# Patient Record
Sex: Female | Born: 1940 | Hispanic: No | Smoking: Never smoker
Health system: Southern US, Community
[De-identification: ages and names within clinical notes are randomized; demographics above are authoritative.]

## PROBLEM LIST (undated history)

## (undated) DIAGNOSIS — F341 Dysthymic disorder: Secondary | ICD-10-CM

## (undated) DIAGNOSIS — S7291XA Unspecified fracture of right femur, initial encounter for closed fracture: Secondary | ICD-10-CM

## (undated) DIAGNOSIS — R002 Palpitations: Secondary | ICD-10-CM

## (undated) DIAGNOSIS — R591 Generalized enlarged lymph nodes: Secondary | ICD-10-CM

## (undated) DIAGNOSIS — B0229 Other postherpetic nervous system involvement: Principal | ICD-10-CM

## (undated) DIAGNOSIS — N390 Urinary tract infection, site not specified: Secondary | ICD-10-CM

## (undated) DIAGNOSIS — K5289 Other specified noninfective gastroenteritis and colitis: Secondary | ICD-10-CM

## (undated) DIAGNOSIS — A048 Other specified bacterial intestinal infections: Secondary | ICD-10-CM

## (undated) DIAGNOSIS — D649 Anemia, unspecified: Secondary | ICD-10-CM

## (undated) DIAGNOSIS — E785 Hyperlipidemia, unspecified: Secondary | ICD-10-CM

## (undated) DIAGNOSIS — E559 Vitamin D deficiency, unspecified: Secondary | ICD-10-CM

## (undated) DIAGNOSIS — Z01818 Encounter for other preprocedural examination: Secondary | ICD-10-CM

## (undated) DIAGNOSIS — R197 Diarrhea, unspecified: Secondary | ICD-10-CM

## (undated) DIAGNOSIS — J449 Chronic obstructive pulmonary disease, unspecified: Secondary | ICD-10-CM

## (undated) DIAGNOSIS — K589 Irritable bowel syndrome without diarrhea: Secondary | ICD-10-CM

## (undated) DIAGNOSIS — E876 Hypokalemia: Secondary | ICD-10-CM

## (undated) DIAGNOSIS — B029 Zoster without complications: Secondary | ICD-10-CM

## (undated) DIAGNOSIS — E0590 Thyrotoxicosis, unspecified without thyrotoxic crisis or storm: Secondary | ICD-10-CM

## (undated) DIAGNOSIS — M1990 Unspecified osteoarthritis, unspecified site: Secondary | ICD-10-CM

## (undated) DIAGNOSIS — Z8619 Personal history of other infectious and parasitic diseases: Secondary | ICD-10-CM

## (undated) DIAGNOSIS — M1610 Unilateral primary osteoarthritis, unspecified hip: Secondary | ICD-10-CM

## (undated) DIAGNOSIS — R634 Abnormal weight loss: Secondary | ICD-10-CM

## (undated) HISTORY — DX: Thyrotoxicosis, unspecified without thyrotoxic crisis or storm: E05.90

## (undated) HISTORY — PX: OTHER SURGICAL HISTORY: SHX169

## (undated) HISTORY — DX: Zoster without complications: B02.9

## (undated) HISTORY — DX: Other specified noninfective gastroenteritis and colitis: K52.89

## (undated) HISTORY — DX: Palpitations: R00.2

## (undated) HISTORY — DX: Other postherpetic nervous system involvement: B02.29

## (undated) HISTORY — DX: Generalized enlarged lymph nodes: R59.1

## (undated) HISTORY — PX: ABDOMINAL HYSTERECTOMY: SHX81

## (undated) HISTORY — DX: Hypokalemia: E87.6

## (undated) HISTORY — PX: APPENDECTOMY: SHX54

## (undated) HISTORY — DX: Unspecified fracture of right femur, initial encounter for closed fracture: S72.91XA

## (undated) HISTORY — DX: Anemia, unspecified: D64.9

## (undated) HISTORY — DX: Other specified bacterial intestinal infections: A04.8

## (undated) HISTORY — DX: Irritable bowel syndrome without diarrhea: K58.9

## (undated) HISTORY — DX: Vitamin D deficiency, unspecified: E55.9

## (undated) HISTORY — DX: Hyperlipidemia, unspecified: E78.5

## (undated) HISTORY — DX: Encounter for other preprocedural examination: Z01.818

## (undated) HISTORY — DX: Abnormal weight loss: R63.4

## (undated) HISTORY — DX: Urinary tract infection, site not specified: N39.0

## (undated) HISTORY — DX: Diarrhea, unspecified: R19.7

## (undated) HISTORY — DX: Personal history of other infectious and parasitic diseases: Z86.19

## (undated) HISTORY — DX: Unilateral primary osteoarthritis, unspecified hip: M16.10

## (undated) HISTORY — PX: BREAST BIOPSY: SHX20

## (undated) HISTORY — DX: Dysthymic disorder: F34.1

---

## 2004-06-13 ENCOUNTER — Ambulatory Visit: Payer: Self-pay | Admitting: Family Medicine

## 2004-09-05 ENCOUNTER — Ambulatory Visit: Payer: Self-pay | Admitting: Family Medicine

## 2005-06-02 ENCOUNTER — Ambulatory Visit: Payer: Self-pay | Admitting: Family Medicine

## 2006-12-08 ENCOUNTER — Emergency Department (HOSPITAL_COMMUNITY): Admission: EM | Admit: 2006-12-08 | Discharge: 2006-12-08 | Payer: Self-pay | Admitting: Emergency Medicine

## 2006-12-10 ENCOUNTER — Ambulatory Visit: Payer: Self-pay | Admitting: Family Medicine

## 2006-12-24 ENCOUNTER — Ambulatory Visit: Payer: Self-pay | Admitting: Family Medicine

## 2007-01-01 ENCOUNTER — Encounter: Payer: Self-pay | Admitting: Family Medicine

## 2007-02-04 ENCOUNTER — Encounter: Payer: Self-pay | Admitting: Family Medicine

## 2007-02-07 ENCOUNTER — Encounter: Admission: RE | Admit: 2007-02-07 | Discharge: 2007-02-07 | Payer: Self-pay | Admitting: Gastroenterology

## 2007-02-24 ENCOUNTER — Encounter: Payer: Self-pay | Admitting: Family Medicine

## 2007-02-25 ENCOUNTER — Telehealth: Payer: Self-pay | Admitting: Family Medicine

## 2007-03-11 ENCOUNTER — Ambulatory Visit: Payer: Self-pay | Admitting: Family Medicine

## 2007-03-11 DIAGNOSIS — N390 Urinary tract infection, site not specified: Secondary | ICD-10-CM | POA: Insufficient documentation

## 2007-03-11 DIAGNOSIS — R634 Abnormal weight loss: Secondary | ICD-10-CM

## 2007-03-11 DIAGNOSIS — E0590 Thyrotoxicosis, unspecified without thyrotoxic crisis or storm: Secondary | ICD-10-CM | POA: Insufficient documentation

## 2007-03-11 DIAGNOSIS — E785 Hyperlipidemia, unspecified: Secondary | ICD-10-CM

## 2007-03-11 HISTORY — DX: Abnormal weight loss: R63.4

## 2007-03-11 HISTORY — DX: Thyrotoxicosis, unspecified without thyrotoxic crisis or storm: E05.90

## 2007-03-11 HISTORY — DX: Urinary tract infection, site not specified: N39.0

## 2007-03-11 HISTORY — DX: Hyperlipidemia, unspecified: E78.5

## 2007-03-12 ENCOUNTER — Encounter: Payer: Self-pay | Admitting: Family Medicine

## 2007-03-13 ENCOUNTER — Telehealth: Payer: Self-pay | Admitting: Family Medicine

## 2007-03-18 ENCOUNTER — Telehealth: Payer: Self-pay | Admitting: Family Medicine

## 2007-03-18 ENCOUNTER — Ambulatory Visit: Payer: Self-pay | Admitting: Family Medicine

## 2007-03-18 LAB — CONVERTED CEMR LAB
Bilirubin Urine: NEGATIVE
Glucose, Urine, Semiquant: NEGATIVE
Ketones, urine, test strip: NEGATIVE
Nitrite: NEGATIVE
Protein, U semiquant: NEGATIVE
Specific Gravity, Urine: 1.015
Urobilinogen, UA: 0.2
pH: 7.5

## 2007-03-19 ENCOUNTER — Telehealth: Payer: Self-pay | Admitting: Family Medicine

## 2007-03-27 ENCOUNTER — Encounter: Payer: Self-pay | Admitting: Family Medicine

## 2007-03-28 ENCOUNTER — Encounter: Payer: Self-pay | Admitting: Family Medicine

## 2007-04-02 ENCOUNTER — Encounter: Payer: Self-pay | Admitting: Family Medicine

## 2007-04-03 ENCOUNTER — Encounter: Payer: Self-pay | Admitting: Family Medicine

## 2007-04-03 ENCOUNTER — Encounter: Admission: RE | Admit: 2007-04-03 | Discharge: 2007-04-03 | Payer: Self-pay | Admitting: Gastroenterology

## 2007-04-30 ENCOUNTER — Encounter: Payer: Self-pay | Admitting: Family Medicine

## 2007-05-22 ENCOUNTER — Ambulatory Visit: Payer: Self-pay | Admitting: Family Medicine

## 2007-05-22 DIAGNOSIS — K5289 Other specified noninfective gastroenteritis and colitis: Secondary | ICD-10-CM | POA: Insufficient documentation

## 2007-05-22 HISTORY — DX: Other specified noninfective gastroenteritis and colitis: K52.89

## 2007-05-23 LAB — CONVERTED CEMR LAB
Basophils Absolute: 0.1 10*3/uL (ref 0.0–0.1)
Basophils Relative: 1.4 % — ABNORMAL HIGH (ref 0.0–1.0)
Eosinophils Absolute: 0.1 10*3/uL (ref 0.0–0.6)
Eosinophils Relative: 1.5 % (ref 0.0–5.0)
Free T4: 0.9 ng/dL (ref 0.6–1.6)
HCT: 38.5 % (ref 36.0–46.0)
Hemoglobin: 13.5 g/dL (ref 12.0–15.0)
Lymphocytes Relative: 22.3 % (ref 12.0–46.0)
MCHC: 35.1 g/dL (ref 30.0–36.0)
MCV: 97 fL (ref 78.0–100.0)
Monocytes Absolute: 0.3 10*3/uL (ref 0.2–0.7)
Monocytes Relative: 5.3 % (ref 3.0–11.0)
Neutro Abs: 3.5 10*3/uL (ref 1.4–7.7)
Neutrophils Relative %: 69.5 % (ref 43.0–77.0)
Platelets: 219 10*3/uL (ref 150–400)
RBC: 3.97 M/uL (ref 3.87–5.11)
RDW: 11.5 % (ref 11.5–14.6)
T3, Free: 3 pg/mL (ref 2.3–4.2)
TSH: 0.98 microintl units/mL (ref 0.35–5.50)
WBC: 5.1 10*3/uL (ref 4.5–10.5)

## 2007-08-05 ENCOUNTER — Encounter: Payer: Self-pay | Admitting: Family Medicine

## 2008-04-20 ENCOUNTER — Ambulatory Visit: Payer: Self-pay | Admitting: Family Medicine

## 2008-05-10 ENCOUNTER — Telehealth: Payer: Self-pay | Admitting: Family Medicine

## 2008-12-01 ENCOUNTER — Telehealth: Payer: Self-pay | Admitting: Family Medicine

## 2009-02-23 ENCOUNTER — Telehealth: Payer: Self-pay | Admitting: Family Medicine

## 2009-05-19 ENCOUNTER — Ambulatory Visit: Payer: Self-pay | Admitting: Family Medicine

## 2009-05-19 DIAGNOSIS — M1610 Unilateral primary osteoarthritis, unspecified hip: Secondary | ICD-10-CM

## 2009-05-19 DIAGNOSIS — E559 Vitamin D deficiency, unspecified: Secondary | ICD-10-CM

## 2009-05-19 DIAGNOSIS — E876 Hypokalemia: Secondary | ICD-10-CM | POA: Insufficient documentation

## 2009-05-19 DIAGNOSIS — D649 Anemia, unspecified: Secondary | ICD-10-CM | POA: Insufficient documentation

## 2009-05-19 DIAGNOSIS — R350 Frequency of micturition: Secondary | ICD-10-CM | POA: Insufficient documentation

## 2009-05-19 DIAGNOSIS — K589 Irritable bowel syndrome without diarrhea: Secondary | ICD-10-CM

## 2009-05-19 DIAGNOSIS — R197 Diarrhea, unspecified: Secondary | ICD-10-CM

## 2009-05-19 DIAGNOSIS — F411 Generalized anxiety disorder: Secondary | ICD-10-CM | POA: Insufficient documentation

## 2009-05-19 DIAGNOSIS — F341 Dysthymic disorder: Secondary | ICD-10-CM

## 2009-05-19 DIAGNOSIS — R252 Cramp and spasm: Secondary | ICD-10-CM | POA: Insufficient documentation

## 2009-05-19 DIAGNOSIS — T50995A Adverse effect of other drugs, medicaments and biological substances, initial encounter: Secondary | ICD-10-CM | POA: Insufficient documentation

## 2009-05-19 HISTORY — DX: Dysthymic disorder: F34.1

## 2009-05-19 HISTORY — DX: Hypokalemia: E87.6

## 2009-05-19 HISTORY — DX: Diarrhea, unspecified: R19.7

## 2009-05-19 HISTORY — DX: Unilateral primary osteoarthritis, unspecified hip: M16.10

## 2009-05-19 HISTORY — DX: Vitamin D deficiency, unspecified: E55.9

## 2009-05-19 HISTORY — DX: Anemia, unspecified: D64.9

## 2009-05-19 HISTORY — DX: Irritable bowel syndrome, unspecified: K58.9

## 2009-05-26 ENCOUNTER — Telehealth: Payer: Self-pay | Admitting: Family Medicine

## 2009-06-01 ENCOUNTER — Encounter: Payer: Self-pay | Admitting: Family Medicine

## 2009-06-01 ENCOUNTER — Encounter (INDEPENDENT_AMBULATORY_CARE_PROVIDER_SITE_OTHER): Payer: Self-pay | Admitting: *Deleted

## 2009-06-09 ENCOUNTER — Encounter: Payer: Self-pay | Admitting: Family Medicine

## 2009-06-14 ENCOUNTER — Telehealth: Payer: Self-pay | Admitting: Family Medicine

## 2009-09-05 ENCOUNTER — Encounter: Payer: Self-pay | Admitting: Family Medicine

## 2009-09-13 ENCOUNTER — Encounter: Payer: Self-pay | Admitting: Family Medicine

## 2009-12-06 ENCOUNTER — Encounter: Payer: Self-pay | Admitting: Family Medicine

## 2009-12-14 ENCOUNTER — Encounter: Payer: Self-pay | Admitting: Family Medicine

## 2010-05-04 ENCOUNTER — Ambulatory Visit: Payer: Self-pay | Admitting: Family Medicine

## 2010-06-07 ENCOUNTER — Encounter: Payer: Self-pay | Admitting: Family Medicine

## 2010-06-13 ENCOUNTER — Encounter: Payer: Self-pay | Admitting: Family Medicine

## 2010-06-15 ENCOUNTER — Encounter: Payer: Self-pay | Admitting: Family Medicine

## 2010-06-27 ENCOUNTER — Encounter: Payer: Self-pay | Admitting: Family Medicine

## 2010-08-11 ENCOUNTER — Telehealth: Payer: Self-pay | Admitting: Family Medicine

## 2010-08-27 LAB — CONVERTED CEMR LAB
ALT: 15 units/L (ref 0–35)
ALT: 16 units/L (ref 0–35)
AST: 19 units/L (ref 0–37)
AST: 22 units/L (ref 0–37)
Albumin: 4.2 g/dL (ref 3.5–5.2)
Albumin: 4.7 g/dL (ref 3.5–5.2)
Alkaline Phosphatase: 50 units/L (ref 39–117)
Alkaline Phosphatase: 58 units/L (ref 39–117)
BUN: 10 mg/dL (ref 6–23)
BUN: 8 mg/dL (ref 6–23)
Basophils Absolute: 0 10*3/uL (ref 0.0–0.1)
Basophils Absolute: 0 10*3/uL (ref 0.0–0.1)
Basophils Relative: 0.2 % (ref 0.0–1.0)
Basophils Relative: 0.6 % (ref 0.0–3.0)
Bilirubin Urine: NEGATIVE
Bilirubin Urine: NEGATIVE
Bilirubin, Direct: 0.1 mg/dL (ref 0.0–0.3)
Bilirubin, Direct: 0.2 mg/dL (ref 0.0–0.3)
CO2: 28 meq/L (ref 19–32)
CO2: 31 meq/L (ref 19–32)
Calcium: 9.1 mg/dL (ref 8.4–10.5)
Calcium: 9.6 mg/dL (ref 8.4–10.5)
Chloride: 105 meq/L (ref 96–112)
Chloride: 106 meq/L (ref 96–112)
Cholesterol: 189 mg/dL (ref 0–200)
Cholesterol: 233 mg/dL — ABNORMAL HIGH (ref 0–200)
Creatinine, Ser: 0.7 mg/dL (ref 0.4–1.2)
Creatinine, Ser: 0.7 mg/dL (ref 0.4–1.2)
Direct LDL: 155.2 mg/dL
Eosinophils Absolute: 0 10*3/uL (ref 0.0–0.6)
Eosinophils Absolute: 0 10*3/uL (ref 0.0–0.7)
Eosinophils Relative: 0.5 % (ref 0.0–5.0)
Eosinophils Relative: 0.6 % (ref 0.0–5.0)
Free T4: 0.9 ng/dL (ref 0.6–1.6)
GFR calc Af Amer: 108 mL/min
GFR calc non Af Amer: 88.42 mL/min (ref >60)
GFR calc non Af Amer: 89 mL/min
Glucose, Bld: 82 mg/dL (ref 70–99)
Glucose, Bld: 92 mg/dL (ref 70–99)
Glucose, Urine, Semiquant: NEGATIVE
Glucose, Urine, Semiquant: NEGATIVE
HCT: 38.7 % (ref 36.0–46.0)
HCT: 40.4 % (ref 36.0–46.0)
HDL: 52.3 mg/dL (ref >39.0)
HDL: 62.5 mg/dL (ref >39.00)
Hemoglobin: 13.6 g/dL (ref 12.0–15.0)
Hemoglobin: 13.9 g/dL (ref 12.0–15.0)
Ketones, urine, test strip: NEGATIVE
Ketones, urine, test strip: NEGATIVE
LDL Cholesterol: 120 mg/dL — ABNORMAL HIGH (ref 0–99)
Lymphocytes Relative: 22.1 % (ref 12.0–46.0)
Lymphocytes Relative: 24.8 % (ref 12.0–46.0)
Lymphs Abs: 1.3 10*3/uL (ref 0.7–4.0)
MCHC: 34.4 g/dL (ref 30.0–36.0)
MCHC: 35 g/dL (ref 30.0–36.0)
MCV: 100.9 fL — ABNORMAL HIGH (ref 78.0–100.0)
MCV: 96.9 fL (ref 78.0–100.0)
Monocytes Absolute: 0.3 10*3/uL (ref 0.2–0.7)
Monocytes Absolute: 0.4 10*3/uL (ref 0.1–1.0)
Monocytes Relative: 4.9 % (ref 3.0–11.0)
Monocytes Relative: 6.8 % (ref 3.0–12.0)
Neutro Abs: 3.5 10*3/uL (ref 1.4–7.7)
Neutro Abs: 4.8 10*3/uL (ref 1.4–7.7)
Neutrophils Relative %: 67.3 % (ref 43.0–77.0)
Neutrophils Relative %: 72.2 % (ref 43.0–77.0)
Nitrite: NEGATIVE
Nitrite: NEGATIVE
Platelets: 200 10*3/uL (ref 150.0–400.0)
Platelets: 224 10*3/uL (ref 150–400)
Potassium: 3.5 meq/L (ref 3.5–5.1)
Potassium: 4.4 meq/L (ref 3.5–5.1)
Protein, U semiquant: NEGATIVE
Protein, U semiquant: NEGATIVE
RBC: 4 M/uL (ref 3.87–5.11)
RBC: 4.01 M/uL (ref 3.87–5.11)
RDW: 11.7 % (ref 11.5–14.6)
RDW: 11.7 % (ref 11.5–14.6)
Sed Rate: 10 mm/hr (ref 0–25)
Sodium: 142 meq/L (ref 135–145)
Sodium: 144 meq/L (ref 135–145)
Specific Gravity, Urine: 1.01
Specific Gravity, Urine: 1.01
T3, Free: 2.7 pg/mL (ref 2.3–4.2)
TSH: 0.87 microintl units/mL (ref 0.35–5.50)
TSH: 1.07 microintl units/mL (ref 0.35–5.50)
Total Bilirubin: 0.9 mg/dL (ref 0.3–1.2)
Total Bilirubin: 1 mg/dL (ref 0.3–1.2)
Total CHOL/HDL Ratio: 3.6
Total CHOL/HDL Ratio: 4
Total Protein: 6.3 g/dL (ref 6.0–8.3)
Total Protein: 6.9 g/dL (ref 6.0–8.3)
Triglycerides: 60 mg/dL (ref 0.0–149.0)
Triglycerides: 84 mg/dL (ref 0–149)
Urobilinogen, UA: 0.2
Urobilinogen, UA: 0.2
VLDL: 12 mg/dL (ref 0.0–40.0)
VLDL: 17 mg/dL (ref 0–40)
Vit D, 25-Hydroxy: 20 ng/mL — ABNORMAL LOW (ref 30–89)
WBC Urine, dipstick: NEGATIVE
WBC: 5.2 10*3/uL (ref 4.5–10.5)
WBC: 6.6 10*3/uL (ref 4.5–10.5)
pH: 5.5
pH: 6

## 2010-08-29 NOTE — Progress Notes (Signed)
Summary: CALL REG MEDICATION.  Phone Note Call from Patient   Caller: Patient Call For: Judithann Sheen MD Summary of Call: Pt calls stating she cannot take the med Dr. Scotty Court gave her last week, and would like her recent lab results. [phone] Initial call taken by: Lynann Beaver CMA,  May 26, 2009 3:26 PM  Follow-up for Phone Call        cannot take hyscoamine  but pharmascist recommended drug for $4.00 --bentyl   Follow-up by: Pura Spice, RN,  May 26, 2009 3:42 PM  Additional Follow-up for Phone Call Additional follow up Details #1::        PER DR  STAFFORD TAKE 1/2 OF HYSCOAMINE PT NOTIFIED AND WILL CALL NEXT WEEK WITH UPDATE ON HER DIARRHEA.  Additional Follow-up by: Pura Spice, RN,  May 26, 2009 4:25 PM

## 2010-08-29 NOTE — Progress Notes (Signed)
Summary: PLEASE CALL  Phone Note Call from Patient Call back at Home Phone [phone]   Caller: Patient Call For: Judithann Sheen MD Summary of Call: PT WOULD LIKE DR STAFFORD TO CALL HER PERSONALLY Initial call taken by: Heron Sabins,  June 14, 2009 10:48 AM  Follow-up for Phone Call        Pt called back and really needs Dr. Scotty Court to call her back asap. will call  Follow-up by: Lucy Antigua,  June 16, 2009 10:41 AM

## 2010-08-29 NOTE — Assessment & Plan Note (Signed)
Summary: FUP/WILL FAST-272.4,1995.2,995.2,2449,599.0   Vital Signs:  Patient Profile:   70 Years Old Female Weight:      112 pounds Temp:     98.3 degrees F oral Pulse rate:   80 / minute BP sitting:   110 / 84  (left arm)  Vitals Entered By: Pura Spice, RN (March 11, 2007 10:21 AM)               Chief Complaint:  ck up .  History of Present Illness: reason for visit and for cpx . IBS with sx diarrhea poorly controlled taking immodium irradiacally has had complete GI wk up by [facility] with negative findings medications -librax levbid and now immodium pt has lost from 122 in May to now 110  unexplained to rule out hyperthyroidism or other metabolic problems          Review of Systems  General      Complains of weight loss.  Eyes      Denies blurring, discharge, double vision, eye irritation, eye pain, halos, itching, light sensitivity, red eye, vision loss-1 eye, and vision loss-both eyes.  ENT      Denies decreased hearing, difficulty swallowing, ear discharge, earache, hoarseness, nasal congestion, nosebleeds, postnasal drainage, ringing in ears, sinus pressure, and sore throat.  CV      Denies bluish discoloration of lips or nails, chest pain or discomfort, difficulty breathing at night, difficulty breathing while lying down, fainting, fatigue, leg cramps with exertion, lightheadness, near fainting, palpitations, shortness of breath with exertion, swelling of feet, swelling of hands, and weight gain.  Resp      Denies chest discomfort, chest pain with inspiration, cough, coughing up blood, excessive snoring, hypersomnolence, morning headaches, pleuritic, shortness of breath, sputum productive, and wheezing.  GI      Complains of abdominal pain and diarrhea.  GU      Denies abnormal vaginal bleeding, decreased libido, discharge, dysuria, genital sores, hematuria, incontinence, nocturia, urinary frequency, and urinary hesitancy.  MS      Denies joint  pain, joint redness, joint swelling, loss of strength, low back pain, mid back pain, muscle aches, muscle , cramps, muscle weakness, stiffness, and thoracic pain.  Derm      Denies changes in color of skin, changes in nail beds, dryness, excessive perspiration, flushing, hair loss, insect bite(s), itching, lesion(s), poor wound healing, and rash.  Neuro      Denies brief paralysis, difficulty with concentration, disturbances in coordination, falling down, headaches, inability to speak, memory loss, numbness, poor balance, seizures, sensation of room spinning, tingling, tremors, visual disturbances, and weakness.  Psych      Complains of anxiety.  Endo      Complains of weight change.  Allergy      Denies hives or rash, itching eyes, persistent infections, seasonal allergies, and sneezing.  ENT      Denies decreased hearing, difficulty swallowing, ear discharge, earache, hoarseness, nasal congestion, nosebleeds, postnasal drainage, ringing in ears, sinus pressure, and sore throat.   Physical Exam  General:     Well-developed,well-nourished,in no acute distress; alert,appropriate and cooperative throughout examination Head:     Normocephalic and atraumatic without obvious abnormalities. No apparent alopecia or balding. Eyes:     No corneal or conjunctival inflammation noted. EOMI. Perrla. Funduscopic exam benign, without hemorrhages, exudates or papilledema. Vision grossly normal. Ears:     External ear exam shows no significant lesions or deformities.  Otoscopic examination reveals clear canals, tympanic membranes are intact  bilaterally without bulging, retraction, inflammation or discharge. Hearing is grossly normal bilaterally. Nose:     External nasal examination shows no deformity or inflammation. Nasal mucosa are pink and moist without lesions or exudates. Neck:     No deformities, masses, or tenderness noted. Chest Wall:     No deformities, masses, or tenderness  noted. Breasts:     No mass, nodules, thickening, tenderness, bulging, retraction, inflamation, nipple discharge or skin changes noted.   Lungs:     Normal respiratory effort, chest expands symmetrically. Lungs are clear to auscultation, no crackles or wheezes. Heart:     Normal rate and regular rhythm. S1 and S2 normal without gallop, murmur, click, rub or other extra sounds. Abdomen:     Bowel sound hyperactive,abdomen soft and non-tender without masses, organomegaly or hernias Rectal:     No external abnormalities noted. Normal sphincter tone. No rectal masses or tenderness. Genitalia:     Normal introitus for age, no external lesions, no vaginal discharge, mucosa pink and moist, no vaginal or cervical lesions, no vaginal atrophy, no friaility or hemorrhage, normal uterus size and position, no adnexal masses or tenderness Msk:     No deformity or scoliosis noted of thoracic or lumbar spine.   Pulses:     R and L carotid,radial,femoral,dorsalis pedis and posterior tibial pulses are full and equal bilaterally Extremities:     No clubbing, cyanosis, edema, or deformity noted with normal full range of motion of all joints.   Neurologic:     No cranial nerve deficits noted. Station and gait are normal. Plantar reflexes are down-going bilaterally. DTRs are symmetrical throughout. Sensory, motor and coordinative functions appear intact. Skin:     Intact without suspicious lesions or rashes Cervical Nodes:     No lymphadenopathy noted Axillary Nodes:     No palpable lymphadenopathy Inguinal Nodes:     No significant adenopathy Psych:     Cognition and judgment appear intact. Alert and cooperative with normal attention span and concentration. No apparent delusions, illusions, hallucinations some anxiety    Impression & Recommendations:  Problem # 1:  WEIGHT LOSS (JYN-829.56) Assessment: New  Orders: Venipuncture (21308) TLB-CBC Platelet - w/Differential (85025-CBCD) TLB-Lipid  Panel (80061-LIPID) TLB-BMP (Basic Metabolic Panel-BMET) (80048-METABOL)   Problem # 2:  IBS (ICD-564.1) Assessment: Unchanged immodium three times a day before meals  Complete Medication List: 1)  Alprazolam 0.25 Mg Tabs (Alprazolam) .... Three times a day as needed stress  Other Orders: EKG w/ Interpretation (93000) EKG w/ Interpretation (93000) TLB-Hepatic/Liver Function Pnl (80076-HEPATIC) TLB-T3, Free (Triiodothyronine) (84481-T3FREE) TLB-T4 (Thyrox), Free [phone]) TLB-TSH (Thyroid Stimulating Hormone) [phone])         Laboratory Results   Urine Tests   Date/Time Reported: March 11, 2007 12:44 PM   Routine Urinalysis   Color: yellow Appearance: Clear Glucose: negative   (Normal Range: Negative) Bilirubin: negative   (Normal Range: Negative) Ketone: negative   (Normal Range: Negative) Spec. Gravity: 1.010   (Normal Range: 1.003-1.035) Blood: 1+   (Normal Range: Negative) pH: 5.5   (Normal Range: 5.0-8.0) Protein: negative   (Normal Range: Negative) Urobilinogen: 0.2   (Normal Range: 0-1) Nitrite: negative   (Normal Range: Negative) Leukocyte Esterace: negative   (Normal Range: Negative)    Comments: ..................................................................Marland KitchenWynona Canes, CMA  March 11, 2007 12:44 PM    Blood Tests    Date/Time Reported: March 11, 2007 1:19 PM   SED rate: 4  Comments: ..................................................................Marland KitchenWynona Canes, CMA  March 11, 2007 1:19 PM

## 2010-08-29 NOTE — Progress Notes (Signed)
Summary: rf request lonox  Phone Note Call from Patient Call back at [phone]   Caller: vm Call For: stafford Reason for Call: Talk to Doctor Summary of Call: Call [facility] for Rx run out.  They faxed request Tues.  diphen-atrop 2.5mg  for diarrhea 2tabs three times a day #60. Initial call taken by: Rudy Jew, RN,  May 10, 2008 11:21 AM  Follow-up for Phone Call        called  Follow-up by: Pura Spice, RN,  May 11, 2008 8:24 AM      Prescriptions: LONOX 2.5-0.025 MG  TABS (DIPHENOXYLATE-ATROPINE) take 1 or 2  tablets three times a day  30 minutes before meals to  prevent diarrhea  #60 x 5   Entered by:   Pura Spice, RN   Authorized by:   Judithann Sheen MD   Signed by:   Pura Spice, RN on 05/11/2008   Method used:   Telephoned to ...       [facility] (retail)       [address]       [place], [place]  [address]       Ph: [phone]       Fax: [phone]   RxID:   [id number]

## 2010-08-29 NOTE — Progress Notes (Signed)
Summary: mess reqest. Dr to call with lab results.  Phone Note Call from Patient Call back at Home Phone [phone]   Caller: Patient Call For: Dr. Scotty Court Reason for Call: Lab or Test Results Summary of Call: Wants results of labs drawn on Tuesday. Initial call taken by: Barnie Mort,  March 13, 2007 10:26 AM  Follow-up for Phone Call        called by Dr Scotty Court this am  Follow-up by: Pura Spice, RN,  March 13, 2007 12:41 PM

## 2010-08-29 NOTE — Progress Notes (Signed)
Summary: needs labs and pt called  Phone Note Call from Patient Call back at Home Phone [phone]   Caller: Patient Call For: stafford Summary of Call: returning phone call to Dr Scotty Court from his call last week, concerning some results from upper GI. Please call Initial call taken by: Calvert Cantor,  February 25, 2007 2:14 PM  Follow-up for Phone Call        make appt for lavbs need lpids cbcd,basic metabolic, hepatic, tsh and urine please call and ask her to be fasting Follow-up by: Judithann Sheen MD,  February 26, 2007 3:50 PM  Additional Follow-up for Phone Call Additional follow up Details #1::        need diagnosis codes and then please send order to schedulers. Additional Follow-up by: Sid Falcon LPN,  February 26, 2007 4:37 PM    Additional Follow-up for Phone Call Additional follow up Details #2::    CBCD 2856 Lipids272.4 Basic Metaboliic Pane l995.2 Hepatic995.2 TSH 2449 Urinalysis 599.0  Additional Follow-up for Phone Call Additional follow up Details #3:: Details for Additional Follow-up Action Taken: PT NOTIFIED. WILL COME IN ON 03/11/07 AT 9:30 PER DR STAFFORD Additional Follow-up by: Warnell Forester,  February 27, 2007 10:38 AM

## 2010-08-29 NOTE — Letter (Signed)
Summary: [facility]  [facility]   Imported By: Kassie Mends 06/05/2007 10:30:55  _____________________________________________________________________  External Attachment:    Type:   Image     Comment:   [facility]

## 2010-08-29 NOTE — Assessment & Plan Note (Signed)
Summary: FLU-SHOT/RCD  Nurse Visit     Impression & Recommendations:       Flu Vaccine Consent Questions     Do you have a history of severe allergic reactions to this vaccine? no    Any prior history of allergic reactions to egg and/or gelatin? no    Do you have a sensitivity to the preservative Thimersol? no    Do you have a past history of Guillan-Barre Syndrome? no    Do you currently have an acute febrile illness? no    Have you ever had a severe reaction to latex? no    Vaccine information given and explained to patient? yes    Are you currently pregnant? no    Lot Number:[id number]   Site Given  Left Deltoid IM   Complete Medication List: 1)  Alprazolam 0.25 Mg Tabs (Alprazolam) .... Three times a day as needed stress 2)  Nitrofurantoin Macrocrystal 50 Mg Caps (Nitrofurantoin macrocrystal) .... Per dr Isabel Caprice 3)  Flomax 0.4 Mg Cp24 (Tamsulosin hcl) .... Per dr Isabel Caprice 4)  Clidinium-chlordiazepoxide 2.5-5 Mg Caps (Clidinium-chlordiazepoxide) 5)  Lonox 2.5-0.025 Mg Tabs (Diphenoxylate-atropine) .... Take 1 or 2  tablets three times a day  30 minutes before meals to  prevent diarrhea  Prior Medications: ALPRAZOLAM 0.25 MG  TABS (ALPRAZOLAM) three times a day as needed stress NITROFURANTOIN MACROCRYSTAL 50 MG  CAPS (NITROFURANTOIN MACROCRYSTAL) per dr Isabel Caprice FLOMAX 0.4 MG  CP24 (TAMSULOSIN HCL) per dr Isabel Caprice CLIDINIUM-CHLORDIAZEPOXIDE 2.5-5 MG  CAPS (CLIDINIUM-CHLORDIAZEPOXIDE)  LONOX 2.5-0.025 MG  TABS (DIPHENOXYLATE-ATROPINE) take 1 or 2  tablets three times a day  30 minutes before meals to  prevent diarrhea     Orders Added: 1)  Flu Vaccine 30yrs + [91478] 2)  Admin of Therapeutic Inj (IM or [place]) Lepidus.[place]nam    ]

## 2010-08-29 NOTE — Letter (Signed)
Summary: Lipid Letter  [place] [facility]  [address] [place], [place] [address]   Phone: [phone]  Fax: [phone]    03/12/2007  [name] [address] [place], [place]  [address]  Dear [name]:  We have carefully reviewed your last lipid profile from  and the results are noted below with a summary of recommendations for lipid management.    Cholesterol:           Goal: <   HDL "good" Cholesterol:       Goal: >   LDL "bad" Cholesterol:         Goal: <   Triglycerides:           Goal: <        TLC Diet (Therapeutic Lifestyle Change): Saturated Fats & Transfatty acids should be kept < 7% of total calories ***Reduce Saturated Fats Polyunstaurated Fat can be up to 10% of total calories Monounsaturated Fat Fat can be up to 20% of total calories Total Fat should be no greater than 25-35% of total calories Carbohydrates should be 50-60% of total calories Protein should be approximately 15% of total calories Fiber should be at least 20-30 grams a day ***Increased fiber may help lower LDL Total Cholesterol should be < 200mg /day Consider adding plant stanol/sterols to diet (example: Benacol spread) ***A higher intake of unsaturated fat may reduce Triglycerides and Increase HDL    Adjunctive Measures (may lower LIPIDS and reduce risk of Heart Attack) include: Aerobic Exercise (20-30 minutes 3-4 times a week) Limit Alcohol Consumption Weight Reduction Aspirin 75-81 mg a day by mouth (if not allergic or contraindicated) Vitamin E 400 IU a day by mouth Folic Acid 1mg  a day by mouth Dietary Fiber 20-30 grams a day by mouth     Current Medications: 1)    Alprazolam 0.25 Mg  Tabs (Alprazolam) .... Three times a day as needed stress  If you have any questions, please call. We appreciate being able to work with you.   Sincerely,     at [employer]

## 2010-08-29 NOTE — Progress Notes (Signed)
Summary: Wants to start Lexapro  Phone Note Call from Patient   Summary of Call: Patient states she has been having some depression. Patient states her husband has some Lexapro that her husband could not take and wants to know if she could start taking one of those every morning. I advised patient to schedule an appointment to see Dr.Stafford for this problem. Patient states she does not have insurance but will have some by the end of September. She wants to wait until then to see Dr. Scotty Court. Patient can be reached at [phone]. Initial call taken by: Darra Lis RMA,  February 23, 2009 9:19 AM  Follow-up for Phone Call        dr staffrod called pt and samples given for lexapro . to sch appt  Follow-up by: Pura Spice, RN,  February 23, 2009 10:20 AM    New/Updated Medications: LEXAPRO 10 MG TABS (ESCITALOPRAM OXALATE)

## 2010-08-29 NOTE — Progress Notes (Signed)
Summary: ? uti   Phone Note Call from Patient Call back at [facility] Phone [phone]   Caller: Patient Call For: STAFFORD Summary of Call: HAS GOT A BLADDER INFECTION AGAIN AND WANTS TO KNOW IF THE PRESCRIPTION (SULFAMETHOXAZOLE) THAT WAS GIVEN BACK IN MAY SHOULD BE FILLED  Initial call taken by: Barnie Mort,  March 18, 2007 9:16 AM  Follow-up for Phone Call        needs to come and leave specimen only  Follow-up by: Pura Spice, RN,  March 18, 2007 10:20 AM  Additional Follow-up for Phone Call Additional follow up Details #1::        Patient advised.  Is on her way. Additional Follow-up by: Rudy Jew, RN,  March 18, 2007 11:02 AM

## 2010-08-29 NOTE — Assessment & Plan Note (Signed)
Summary: cpx/pt will be fasting/mm   Vital Signs:  Patient profile:   70 year old female Height:      61 inches Weight:      98 pounds BMI:     18.58 O2 Sat:      98 % Temp:     98.1 degrees F Pulse rate:   70 / minute BP sitting:   130 / 80  (left arm)  Vitals Entered By: Pura Spice, RN (May 19, 2009 8:27 AM) CC: go over problems refill meds ck labs    History of Present Illness: Pt in to discuss problems and rrefill medications  The patient's primary complaint is that she has lost 30 pounds over the past year since April 2000. This most likely has been due to her persistent and recurrent diarrhea after she seen a food or after each meal. She has a decreased appetite but relates that she does no matter what she has for a meal even a small female she will have diarrhea following this intake. No matter what type of food with this plan soft liquid or whatever she does have fully female whether it is liquid solid small she still has diarrhea. She tends to have diarrhea he did have greater severity when she goes out to the end this decreases her desire to eat in restaurants. One other complaint is she has  noticed a small lumpin the left upper aspect of the left axilla which has been present for the past year and has not increased in size over this appeared at time Should mention she is scheduled to have a mammogram bone density and had a colonoscopy in 2000 made by Dr. Leary Roca exam and colon to be normal however did not offer any type of treatment to resolve the frequent diarrhea problem. Past frequent episodes of pain in the right which has been diagnosed as arthritis of the hip in the past diclofenac has given her considerable relief when she takes it She has a past history of recurrent deep TI's and has been treated by Dr. Isabel Caprice  this has been much improved over the past year  Allergies (verified): No Known Drug Allergies  Past History:  Past Surgical  History: Hysterectomy--total  Past History:  Care Management: Gastroenterology:  Dr Ewing Schlein  Review of Systems      See HPI General:  See HPI; Complains of fatigue and weakness. Eyes:  Denies blurring, discharge, double vision, eye irritation, eye pain, halos, itching, light sensitivity, red eye, vision loss-1 eye, and vision loss-both eyes. ENT:  Denies decreased hearing, difficulty swallowing, ear discharge, earache, hoarseness, nasal congestion, nosebleeds, postnasal drainage, ringing in ears, sinus pressure, and sore throat. CV:  Denies bluish discoloration of lips or nails, chest pain or discomfort, difficulty breathing at night, difficulty breathing while lying down, fainting, fatigue, leg cramps with exertion, lightheadness, near fainting, palpitations, shortness of breath with exertion, swelling of feet, swelling of hands, and weight gain. Resp:  Denies chest discomfort, chest pain with inspiration, cough, coughing up blood, excessive snoring, hypersomnolence, morning headaches, pleuritic, shortness of breath, sputum productive, and wheezing. GI:  See HPI; Complains of diarrhea. GU:  Denies abnormal vaginal bleeding, decreased libido, discharge, dysuria, genital sores, hematuria, incontinence, nocturia, urinary frequency, and urinary hesitancy; past history of recurrent urinary tract infections and treated by Dr. Rolena Infante. for approximately one year and has been resolved. MS:  Complains of joint pain. Derm:  Denies changes in color of skin, changes in nail beds,  dryness, excessive perspiration, flushing, hair loss, insect bite(s), itching, lesion(s), poor wound healing, and rash. Neuro:  Denies brief paralysis, difficulty with concentration, disturbances in coordination, falling down, headaches, inability to speak, memory loss, numbness, poor balance, seizures, sensation of room spinning, tingling, tremors, visual disturbances, and weakness. Psych:  Complains of anxiety and  depression.  Physical Exam  General:  Well-developed,well-nourished,in no acute distress; alert,appropriate and cooperative throughout examinationunderweight appearing.   Head:  Normocephalic and atraumatic without obvious abnormalities. No apparent alopecia or balding. Eyes:  No corneal or conjunctival inflammation noted. EOMI. Perrla. Funduscopic exam benign, without hemorrhages, exudates or papilledema. Vision grossly normal. Ears:  External ear exam shows no significant lesions or deformities.  Otoscopic examination reveals clear canals, tympanic membranes are intact bilaterally without bulging, retraction, inflammation or discharge. Hearing is grossly normal bilaterally. Nose:  External nasal examination shows no deformity or inflammation. Nasal mucosa are pink and moist without lesions or exudates. Mouth:  Oral mucosa and oropharynx without lesions or exudates.  Teeth in good repair. Neck:  No deformities, masses, or tenderness noted. Chest Wall:  No deformities, masses, or tenderness noted. Breasts:  No mass, nodules, thickening, tenderness, bulging, retraction, inflamation, nipple discharge or skin changes noted.  a small movable lymph node is present in the left axilla is nontender and is movable Lungs:  Normal respiratory effort, chest expands symmetrically. Lungs are clear to auscultation, no crackles or wheezes. Heart:  Normal rate and regular rhythm. S1 and S2 normal without gallop, murmur, click, rub or other extra sounds. EKG is normal with a slow rate of 58 Abdomen:  bowel sounds hyperactive.  noted generalized tenderness liver spleen and kidneys are nonpalpable CVA regions negative no tenderness of the lower abdomen Rectal:  No external abnormalities noted. Normal sphincter tone. No rectal masses or tenderness. Genitalia:  external don't AF is normal no abnormalities noted vaginal mucosa slightly atrophic and dry uterus and ovaries are absent Msk:  tenderness over the right heel  no limitation of movement Pulses:  R and L carotid,radial,femoral,dorsalis pedis and posterior tibial pulses are full and equal bilaterally Extremities:  No clubbing, cyanosis, edema, or deformity noted with normal full range of motion of all joints.   Neurologic:  No cranial nerve deficits noted. Station and gait are normal. Plantar reflexes are down-going bilaterally. DTRs are symmetrical throughout. Sensory, motor and coordinative functions appear intact. Skin:  Intact without suspicious lesions or rashesdecreased turgor.   Cervical Nodes:  No lymphadenopathy noted Axillary Nodes:  in the left axilla there is a small approximately 1 cm lymph node which is nontender and movable Inguinal Nodes:  No significant adenopathy Psych:  Cognition and judgment appear intact. Alert and cooperative with normal attention span and concentration. No apparent delusions, illusions, hallucinations   Impression & Recommendations:  Problem # 1:  IRRITABLE BOWEL SYNDROME (ICD-564.1) Assessment Deteriorated  IBS persist with episodes of diarrhea following meals hyoscyamine and low motility U.'s to control his problem  Orders: Prescription Created Electronically [phone])  Problem # 2:  DIARRHEA (ICD-787.91) Assessment: Deteriorated  Her updated medication list for this problem includes:    Lomotil 2.5-0.025 Mg Tabs (Diphenoxylate-atropine) .Marland Kitchen... Take one or two tablets by mouth three times daily before meals to prevent diarrhea .Marland Kitchen  Problem # 3:  WEIGHT LOSS (ICD-783.21) Assessment: Deteriorated  Orders: TLB-TSH (Thyroid Stimulating Hormone) (84443-TSH)  Problem # 4:  ARTHRITIS, HIP (ICD-716.95) Assessment: Unchanged  Complete Medication List: 1)  Alprazolam 0.25 Mg Tabs (Alprazolam) .... Three times a day as needed  stress 2)  Lomotil 2.5-0.025 Mg Tabs (Diphenoxylate-atropine) .... Take one or two tablets by mouth three times daily before meals to prevent diarrhea .Marland Kitchen 3)  Lexapro 10 Mg Tabs  (Escitalopram oxalate) 4)  Hyoscyamine Sulfate Cr 0.375 Mg Xr12h-tab (Hyoscyamine sulfate) .Marland Kitchen.. 1 tab in am and hs to prevent diarrhea, take daily  Other Orders: Pneumococcal Vaccine ([address]) Admin 1st Vaccine (60454) Flu Vaccine 46yrs + (09811) Administration Flu vaccine - MCR (G0008) Venipuncture (91478) TLB-Lipid Panel (80061-LIPID) TLB-BMP (Basic Metabolic Panel-BMET) (80048-METABOL) TLB-CBC Platelet - w/Differential (85025-CBCD) TLB-Hepatic/Liver Function Pnl (80076-HEPATIC) T-Vitamin D (25-Hydroxy) (29562-13086) UA Dipstick w/o Micro (automated)  (81003) EKG w/ Interpretation (93000)  Patient Instructions: 1)  IBS with diarrhea after eating at meal time 2)  to take hyoscyamine .375 AM and PM to prevent diarrheaUse lomotil before meals when goinh out to eat 3)  will call lab results 4)  continue lexapro 4 anxiety and depression. 5)  Diclofenac 75 mg Twice daily for arthritis of the hip. 6)  Call or return in 2-4 weeks as far as results of treatment 7)  Schedule bone density and mammogram in the near future 8)  Would check lymph node in left axilla in approximately 3 months Prescriptions: HYOSCYAMINE SULFATE CR 0.375 MG XR12H-TAB (HYOSCYAMINE SULFATE) 1 tab in AM and hs to prevent diarrhea, take daily  #60 x 11   Entered and Authorized by:   Judithann Sheen MD   Signed by:   Judithann Sheen MD on 05/19/2009   Method used:   Electronically to        [facility]ail[facility]       [address]       [place], [place]  [address]       Ph: [phone]       Fax: [phone]   RxID:   [id number] LOMOTIL 2.5-0.025 MG TABS (DIPHENOXYLATE-ATROPINE) take one or two tablets by mouth three times daily before meals to prevent diarrhea .Marland Kitchen  #100 x 5   Entered and Authorized by:   Judithann Sheen MD   Signed by:   Judithann Sheen MD on 05/19/2009   Method used:   Print then Give to Patient   RxID:    [id number]     Immunizations Administered:  Pneumonia Vaccine:    Vaccine Type: Pneumovax    Site: right deltoid    Mfr: Merck    Dose: 0.5 ml    Route: IM    Given by: Pura Spice, RN    Exp. Date: 07/19/2010    Lot #: [id number] Flu Vaccine Consent Questions     Do you have a history of severe allergic reactions to this vaccine? no    Any prior history of allergic reactions to egg and/or gelatin? no    Do you have a sensitivity to the preservative Thimersol? no    Do you have a past history of Guillan-Barre Syndrome? no    Do you currently have an acute febrile illness? no    Have you ever had a severe reaction to latex? no    Vaccine information given and explained to patient? yes    Are you currently pregnant? no    Lot Number:[id number]   Exp Date:01/26/2010   Site Given  Left Deltoid IM .Marland Kitchen...gh rn......................   e: 07/19/2010    Lot #: [id number]   .lbmedflu    Laboratory Results   Urine Tests  Routine Urinalysis   Color: yellow Appearance: Clear Glucose: negative   (Normal Range: Negative) Bilirubin: negative   (Normal Range: Negative) Ketone: negative   (Normal Range: Negative) Spec. Gravity: 1.010   (Normal Range: 1.003-1.035) Blood: 1+   (Normal Range: Negative) pH: 6.0   (Normal Range: 5.0-8.0) Protein: negative   (Normal Range: Negative) Urobilinogen: 0.2   (Normal Range: 0-1) Nitrite: negative   (Normal Range: Negative) Leukocyte Esterace: 1+   (Normal Range: Negative)    Comments: Joanne Chars CMA  May 19, 2009 12:05 PM

## 2010-08-29 NOTE — Letter (Signed)
Summary: Dr. Isabel Caprice note  Dr. Isabel Caprice note   Imported By: Kassie Mends 04/11/2007 14:51:31  _____________________________________________________________________  External Attachment:    Type:   Image     Comment:   Dr. Isabel Caprice note

## 2010-08-29 NOTE — Assessment & Plan Note (Signed)
Summary: F/U   Vital Signs:  Patient Profile:   70 Years Old Female Weight:      108 pounds Temp:     98.4 degrees F Pulse rate:   92 / minute BP sitting:   110 / 80  (left arm)  Vitals Entered By: Pura Spice, RN (May 22, 2007 10:57 AM)                 Chief Complaint:  reck dr Justin Mend put her on flomax  and nitrofurin  still having bouts of diarrhea.  History of Present Illness: CHRONI URINARY TRACT INF TX BY DR GRAPEY ON FLOMAX AND MACROBID CHRONIC DIARRHEA BEING TX BY DR MAGOD PT HAS LOST WEIGHT IN 5-08 WEIGHED 125 AND NOW WT IS 108 DIARRHEA MAJOR PROBLEM AGGRAVED BY ALL FOODS ESP LACTOSE FOODS PE EXAM NEGATIVE EXCEPT FOR MINIMAL GENERALIZED ABD TENDERNESS AND INCREASED BOOWEL HABIT MED ARE MACRODBID 50MG  once daily FLOMAX 0.4 HS HAS BEEN TAKING LIBRAX three times a day AC AND ALPRAZOLAM 0.25 MG three times a day  NEW PLAN OF TX--AVOID LACTOSE PRODUTS AND ROUGHAGE  FOR SHORT TIME TAKE LONOX 1 OR 2 three times a day BEFORE MEALS CONTINUE OTHER MEDS EXCEPT LIBRAX CALL IN 2 WKS        Review of Systems      See HPI     Impression & Recommendations:  lot [id number], EXP 30 jun 09, sanofi pasteur left deltoid IM, 0.5 cc. ..................................................................Marland KitchenPura Spice, RN  May 22, 2007 11:05 AM   Complete Medication List: 1)  Alprazolam 0.25 Mg Tabs (Alprazolam) .... Three times a day as needed stress 2)  Nitrofurantoin Macrocrystal 50 Mg Caps (Nitrofurantoin macrocrystal) .... Per dr Isabel Caprice 3)  Flomax 0.4 Mg Cp24 (Tamsulosin hcl) .... Per dr Isabel Caprice 4)  Clidinium-chlordiazepoxide 2.5-5 Mg Caps (Clidinium-chlordiazepoxide) 5)  Lonox 2.5-0.025 Mg Tabs (Diphenoxylate-atropine) .... Take 1 or 2  tablets three times a day  30 minutes before meals to  prevent diarrhea     Prescriptions: LONOX 2.5-0.025 MG  TABS (DIPHENOXYLATE-ATROPINE) take 1 or 2  tablets three times a day  30 minutes before meals to  prevent diarrhea   #60 x 6   Entered by:   Pura Spice, RN   Authorized by:   Judithann Sheen MD   Signed by:   Pura Spice, RN on 05/22/2007   Method used:   Electronically sent to ...       [facility]  [place] [address]       [address]       [place], [place]  [address]       Ph: [phone] or [phone]       Fax: [phone]   RxID:   [id number]  ]  Influenza Vaccine    Vaccine Type: Fluvax MCR  Flu Vaccine Consent Questions    Do you have a history of severe allergic reactions to this vaccine? no    Any prior history of allergic reactions to egg and/or gelatin? no    Do you have a sensitivity to the preservative Thimersol? no    Do you have a past history of Guillan-Barre Syndrome? no    Do you currently have an acute febrile illness? no    Have you ever had a severe reaction to latex? no    Vaccine information given and explained to patient? yes    Are you currently pregnant? no

## 2010-08-29 NOTE — Progress Notes (Signed)
Summary: pt wants you to call  Phone Note Call from Patient Call back at Home Phone [phone]   Caller: Patient Call For: STAFFORD  Summary of Call: WANTS DR.STAFFORD TO CALL AT HIS CONVIENCE Initial call taken by: Barnie Mort,  March 19, 2007 11:40 AM  Follow-up for Phone Call        called by dr Scotty Court Follow-up by: Judithann Sheen MD,  March 19, 2007 1:37 PM

## 2010-08-29 NOTE — Op Note (Signed)
Summary: Fine Needle Aspiration w/Ultrasound Guidance  Fine Needle Aspiration w/Ultrasound Guidance   Imported By: Maryln Gottron 07/05/2010 15:51:12  _____________________________________________________________________  External Attachment:    Type:   Image     Comment:   External Document

## 2010-08-29 NOTE — Consult Note (Signed)
Summary: [facility]   Imported By: Maryln Gottron 10/24/2007 15:05:17  _____________________________________________________________________  External Attachment:    Type:   Image     Comment:   External Document

## 2010-08-29 NOTE — Assessment & Plan Note (Signed)
Summary: flu shot//ccm  Nurse Visit   Review of Systems       Flu Vaccine Consent Questions     Do you have a history of severe allergic reactions to this vaccine? no    Any prior history of allergic reactions to egg and/or gelatin? no    Do you have a sensitivity to the preservative Thimersol? no    Do you have a past history of Guillan-Barre Syndrome? no    Do you currently have an acute febrile illness? no    Have you ever had a severe reaction to latex? no    Vaccine information given and explained to patient? yes    Are you currently pregnant? no    Lot Number:[id number]   Exp Date:01/27/2011   Site Given  Left Deltoid IM Josph Macho RMA  May 04, 2010 9:26 AM     Allergies: No Known Drug Allergies  Orders Added: 1)  Flu Vaccine 2yrs + MEDICARE PATIENTS [Q2039] 2)  Administration Flu vaccine - MCR [G0008]

## 2010-08-29 NOTE — Progress Notes (Signed)
Summary: Order for Breast biopsy  Order for Breast biopsy   Imported By: Maryln Gottron 06/16/2010 15:59:13  _____________________________________________________________________  External Attachment:    Type:   Image     Comment:   External Document

## 2010-08-29 NOTE — Procedures (Signed)
Summary: Colonoscopy Report/[facility]  Colonoscopy Report/[facility]   Imported By: Maryln Gottron 01/02/2010 15:45:19  _____________________________________________________________________  External Attachment:    Type:   Image     Comment:   External Document

## 2010-08-29 NOTE — Letter (Signed)
Summary: Dr. Ewing Schlein note  Dr. Ewing Schlein note   Imported By: Kassie Mends 04/30/2007 14:02:46  _____________________________________________________________________  External Attachment:    Type:   Image     Comment:   Dr. Ewing Schlein note

## 2010-08-29 NOTE — Progress Notes (Signed)
Summary: refill  LOMOTIL    Phone Note Call from Patient Call back at [facility] Phone [phone]   Summary of Call: pt is calling for a refill of diphen/atrop 2.5 mg.  [facility]. Initial call taken by: Kern Reap CMA,  Dec 01, 2008 9:47 AM  Follow-up for Phone Call        ok per dr Scotty Court then needs ov Follow-up by: Pura Spice, RN,  Dec 01, 2008 11:05 AM    New/Updated Medications: LOMOTIL 2.5-0.025 MG TABS (DIPHENOXYLATE-ATROPINE) take one or two tablets by mouth three times daily before meals to prevent diarrhea .Marland Kitchen  NEEDS OFFICE VISIT PRIOR TO NEXT REFILL   Prescriptions: LOMOTIL 2.5-0.025 MG TABS (DIPHENOXYLATE-ATROPINE) take one or two tablets by mouth three times daily before meals to prevent diarrhea .Marland Kitchen  NEEDS OFFICE VISIT PRIOR TO NEXT REFILL  #60 x 0   Entered by:   Pura Spice, RN   Authorized by:   Judithann Sheen MD   Signed by:   Pura Spice, RN on 12/01/2008   Method used:   Printed then faxed to ...       [facility] ([facility]ail)       [address]       [place], [place]  [address]       Ph: [phone]       Fax: [phone]   RxID:   [id number]

## 2010-08-31 NOTE — Progress Notes (Signed)
Summary: Transfer PCP   Phone Note Call from Patient Call back at Home Phone [phone]   Caller: Patient Summary of Call: pt not ready to make an appt now but would like to transfer care to [facility] from [facility] Initial call taken by: Lannette Donath,  August 11, 2010 2:19 PM  Follow-up for Phone Call        per Dr Scotty Court this is ok Follow-up by: Alfred Levins, CMA,  August 11, 2010 4:07 PM

## 2010-10-24 ENCOUNTER — Inpatient Hospital Stay (HOSPITAL_COMMUNITY)
Admission: EM | Admit: 2010-10-24 | Discharge: 2010-10-26 | DRG: 287 | Disposition: A | Discharge status: Home / Self Care | Payer: Medicare Other | Attending: Cardiology | Admitting: Cardiology

## 2010-10-24 ENCOUNTER — Encounter: Payer: Self-pay | Admitting: Family Medicine

## 2010-10-24 ENCOUNTER — Emergency Department (HOSPITAL_COMMUNITY): Payer: Medicare Other

## 2010-10-24 ENCOUNTER — Ambulatory Visit (INDEPENDENT_AMBULATORY_CARE_PROVIDER_SITE_OTHER): Payer: Medicare Other | Admitting: Family Medicine

## 2010-10-24 DIAGNOSIS — M1990 Unspecified osteoarthritis, unspecified site: Secondary | ICD-10-CM | POA: Diagnosis present

## 2010-10-24 DIAGNOSIS — Z7982 Long term (current) use of aspirin: Secondary | ICD-10-CM

## 2010-10-24 DIAGNOSIS — E0590 Thyrotoxicosis, unspecified without thyrotoxic crisis or storm: Secondary | ICD-10-CM

## 2010-10-24 DIAGNOSIS — R079 Chest pain, unspecified: Secondary | ICD-10-CM

## 2010-10-24 DIAGNOSIS — D649 Anemia, unspecified: Secondary | ICD-10-CM

## 2010-10-24 DIAGNOSIS — K589 Irritable bowel syndrome without diarrhea: Secondary | ICD-10-CM | POA: Diagnosis present

## 2010-10-24 DIAGNOSIS — R002 Palpitations: Secondary | ICD-10-CM

## 2010-10-24 DIAGNOSIS — R0789 Other chest pain: Principal | ICD-10-CM | POA: Diagnosis present

## 2010-10-24 DIAGNOSIS — E785 Hyperlipidemia, unspecified: Secondary | ICD-10-CM

## 2010-10-24 DIAGNOSIS — E876 Hypokalemia: Secondary | ICD-10-CM

## 2010-10-24 HISTORY — DX: Palpitations: R00.2

## 2010-10-24 LAB — DIFFERENTIAL
Basophils Absolute: 0 10*3/uL (ref 0.0–0.1)
Basophils Relative: 0 % (ref 0–1)
Eosinophils Absolute: 0 10*3/uL (ref 0.0–0.7)
Eosinophils Relative: 0 % (ref 0–5)
Lymphocytes Relative: 21 % (ref 12–46)
Lymphs Abs: 1 10*3/uL (ref 0.7–4.0)
Monocytes Absolute: 0.2 10*3/uL (ref 0.1–1.0)
Monocytes Relative: 4 % (ref 3–12)
Neutro Abs: 3.4 10*3/uL (ref 1.7–7.7)
Neutrophils Relative %: 74 % (ref 43–77)

## 2010-10-24 LAB — POCT CARDIAC MARKERS
CKMB, poc: <1 ng/mL — ABNORMAL LOW (ref 1.0–8.0)
CKMB, poc: <1 ng/mL — ABNORMAL LOW (ref 1.0–8.0)
Myoglobin, poc: 41.4 ng/mL (ref 12–200)
Myoglobin, poc: 43.7 ng/mL (ref 12–200)
Troponin i, poc: <0.05 ng/mL (ref 0.00–0.09)
Troponin i, poc: <0.05 ng/mL (ref 0.00–0.09)

## 2010-10-24 LAB — BASIC METABOLIC PANEL
BUN: 7 mg/dL (ref 6–23)
CO2: 29 mEq/L (ref 19–32)
Calcium: 9.3 mg/dL (ref 8.4–10.5)
Chloride: 103 mEq/L (ref 96–112)
Creatinine, Ser: 0.63 mg/dL (ref 0.4–1.2)
GFR calc Af Amer: >60 mL/min (ref >60)
GFR calc non Af Amer: >60 mL/min (ref >60)
Glucose, Bld: 100 mg/dL — ABNORMAL HIGH (ref 70–99)
Potassium: 4.2 mEq/L (ref 3.5–5.1)
Sodium: 137 mEq/L (ref 135–145)

## 2010-10-24 LAB — CK TOTAL AND CKMB (NOT AT ARMC)
CK, MB: 1.2 ng/mL (ref 0.3–4.0)
Relative Index: INVALID (ref 0.0–2.5)
Total CK: 46 U/L (ref 7–177)

## 2010-10-24 LAB — PROTIME-INR
INR: 1.02 (ref 0.00–1.49)
Prothrombin Time: 13.6 seconds (ref 11.6–15.2)

## 2010-10-24 LAB — CBC
HCT: 38.5 % (ref 36.0–46.0)
Hemoglobin: 13.2 g/dL (ref 12.0–15.0)
MCH: 32.3 pg (ref 26.0–34.0)
MCHC: 34.3 g/dL (ref 30.0–36.0)
MCV: 94.1 fL (ref 78.0–100.0)
Platelets: 193 10*3/uL (ref 150–400)
RBC: 4.09 MIL/uL (ref 3.87–5.11)
RDW: 11.7 % (ref 11.5–15.5)
WBC: 4.6 10*3/uL (ref 4.0–10.5)

## 2010-10-24 LAB — TROPONIN I: Troponin I: <0.01 ng/mL (ref 0.00–0.06)

## 2010-10-24 LAB — APTT: aPTT: 28 seconds (ref 24–37)

## 2010-10-24 NOTE — Assessment & Plan Note (Signed)
Patient has been having 3 months worth of intermittent palpitations with exertion with associated CP, SOB, fatigue, resolving over several hours with rest and Aspirin

## 2010-10-24 NOTE — Assessment & Plan Note (Addendum)
Patient with 3-4 hours of chest pressure last night and a 3 month history of increasing episodes of palpitations, with SOB, chest pressure and fatigue. Spoke with Pe[place]ier and patient and with acute episode last night will send patient to [facility] for r/u MI and further work up. No pain at present. C/o some SOB persistent in the office given ECASA 81mg , 4 tabs po given at 12:06PM, O2 applied

## 2010-10-24 NOTE — Progress Notes (Signed)
Subjective:    Patient ID: [name], female    DOB: 1941/04/26, 70 y.o.   MRN: [id number]  Chest Pain  Associated symptoms include palpitations and shortness of breath. Pertinent negatives include no abdominal pain, back pain, cough, diaphoresis, fever, nausea or vomiting.   Patient is a 70 yo Caucasian female with substernal Chest pressure last night, lasting 3-4 hours last night. Resolved after rest and aspirin, she has has 3 months of palpitations, SOB, CP, fatigue lasting hours. Now her pain was 9/10 last night. She reports she has been noting the previous palpitations would come on when she was exerting herself usually in the yard, they would make her feel SOB, sometimes CP, fatigued and she would need to rest for several hours and take an aspirin before she felt better. If CP occurred with these episodes it did not last for the duration but instead for only 10-15 minutes. She denies any recent illness, fevers, chills, congestion, cough, trauma. Nothing makes the SOB worse or better. Last night change of position could not improve her CP only time seem to help. Has not had lab work done in quite some time to evaluate her hyperlipidemia, anemia or hyperthyroidisim  Review of Systems  Constitutional: Positive for activity change and fatigue. Negative for fever, chills, diaphoresis and appetite change.  HENT: Negative for congestion, rhinorrhea, neck pain, neck stiffness and sinus pressure.   Respiratory: Positive for chest tightness and shortness of breath. Negative for cough and wheezing.   Cardiovascular: Positive for chest pain and palpitations. Negative for leg swelling.  Gastrointestinal: Positive for diarrhea. Negative for nausea, vomiting, abdominal pain, constipation and abdominal distention.  Musculoskeletal: Negative for back pain.  Psychiatric/Behavioral: Positive for agitation. Negative for behavioral problems.       [Under a great deal of stress lately with an ill husband and  some difficulty with some inflamed LN in her left breast being followed by [facility]       Past Medical History  Diagnosis Date  . HYPERTHYROIDISM 03/11/2007  . Irritable bowel syndrome 05/19/2009  . HYPERLIPIDEMIA 03/11/2007  . ANEMIA 05/19/2009  . VITAMIN D DEFICIENCY 05/19/2009  . WEIGHT LOSS 03/11/2007  . HYPOKALEMIA 05/19/2009  . Diarrhea 05/19/2009  . ARTHRITIS, HIP 05/19/2009  . ANXIETY DEPRESSION 05/19/2009  . COLITIS 05/22/2007    Past Surgical History  Procedure Date  . Abdominal hysterectomy     total  . Appendectomy     Family History  Problem Relation Age of Onset  . Alzheimer's disease Mother   . Other Father     blood clot  . Cancer Sister     breast  . Cancer Maternal Aunt     breast  . Heart attack Paternal Grandfather   . Cancer Sister     bladder ca  . Cancer Sister     breast    History   Social History  . Marital Status: Married    Spouse Name: N/A    Number of Children: N/A  . Years of Education: N/A   Occupational History  . Not on file.   Social History Main Topics  . Smoking status: Never Smoker   . Smokeless tobacco: Never Used  . Alcohol Use: No  . Drug Use: No  . Sexually Active: No   Other Topics Concern  . Not on file   Social History Narrative  . No narrative on file    No current outpatient prescriptions on file prior to visit.    No Known  Allergies        Objective:   Physical Exam  Constitutional: She is oriented to person, place, and time. She appears well-developed and well-nourished. No distress.  HENT:  Head: Normocephalic and atraumatic.  Eyes: Conjunctivae are normal.  Neck: Normal range of motion. Neck supple. No JVD present. No thyromegaly present.  Cardiovascular: Regular rhythm.   No murmur heard.      Bradycardia with ectopic beat occasionally, sinus rhythm   Pulmonary/Chest: Effort normal and breath sounds normal. No respiratory distress. She has no wheezes.  Abdominal: Soft. Bowel sounds  are normal. She exhibits no distension and no mass. There is no tenderness.  Musculoskeletal: She exhibits no edema.  Neurological: She is alert and oriented to person, place, and time.  Skin: Skin is warm and dry. She is not diaphoretic.  Psychiatric: She has a normal mood and affect. Her behavior is normal. Judgment normal.          Assessment & Plan:  Chest pain Patient with 3-4 hours of chest pressure last night and a 3 month history of increasing episodes of palpitations, with SOB, chest pressure and fatigue. Spoke with [place] and patient and with acute episode last night will send patient to [facility] for r/u MI and further work up. No pain at present. C/o some SOB persistent in the office given ECASA 81mg , 4 tabs po given at 12:06PM, O2 applied   Palpitations Patient has been having 3 months worth of intermittent palpitations with exertion with associated CP, SOB, fatigue, resolving over several hours with rest and Aspirin   Hyperlipidemia: has not had an FLP in several years will require repeat along with fasting labs at hospital or at next visit here upon release. Anemia: distant history but no recent blood work to confirm it has resolved Hyperthyroidism: no recent blood work but patient is Bradycardic at this time

## 2010-10-25 LAB — CBC
HCT: 38.2 % (ref 36.0–46.0)
Hemoglobin: 13.1 g/dL (ref 12.0–15.0)
MCH: 32.1 pg (ref 26.0–34.0)
MCHC: 34.3 g/dL (ref 30.0–36.0)
MCV: 93.6 fL (ref 78.0–100.0)
Platelets: 187 10*3/uL (ref 150–400)
RBC: 4.08 MIL/uL (ref 3.87–5.11)
RDW: 11.6 % (ref 11.5–15.5)
WBC: 5.7 10*3/uL (ref 4.0–10.5)

## 2010-10-25 LAB — HEPARIN LEVEL (UNFRACTIONATED)
Heparin Unfractionated: 0.15 IU/mL — ABNORMAL LOW (ref 0.30–0.70)
Heparin Unfractionated: 0.36 IU/mL (ref 0.30–0.70)

## 2010-10-25 LAB — CARDIAC PANEL(CRET KIN+CKTOT+MB+TROPI)
CK, MB: 2 ng/mL (ref 0.3–4.0)
CK, MB: 2.1 ng/mL (ref 0.3–4.0)
Relative Index: 1.9 (ref 0.0–2.5)
Relative Index: INVALID (ref 0.0–2.5)
Total CK: 109 U/L (ref 7–177)
Total CK: 87 U/L (ref 7–177)
Troponin I: 0.01 ng/mL (ref 0.00–0.06)
Troponin I: 0.02 ng/mL (ref 0.00–0.06)

## 2010-10-25 LAB — POCT ACTIVATED CLOTTING TIME: Activated Clotting Time: 146 seconds

## 2010-10-25 LAB — LIPID PANEL
Cholesterol: 192 mg/dL (ref 0–200)
HDL: 55 mg/dL (ref >39)
LDL Cholesterol: 126 mg/dL — ABNORMAL HIGH (ref 0–99)
Total CHOL/HDL Ratio: 3.5 RATIO
Triglycerides: 55 mg/dL (ref <150)
VLDL: 11 mg/dL (ref 0–40)

## 2010-10-26 ENCOUNTER — Other Ambulatory Visit: Payer: Self-pay | Admitting: Internal Medicine

## 2010-10-26 DIAGNOSIS — R197 Diarrhea, unspecified: Secondary | ICD-10-CM

## 2010-10-26 DIAGNOSIS — R1013 Epigastric pain: Secondary | ICD-10-CM

## 2010-10-26 DIAGNOSIS — R079 Chest pain, unspecified: Secondary | ICD-10-CM

## 2010-10-26 LAB — CBC
HCT: 37.4 % (ref 36.0–46.0)
Hemoglobin: 12.9 g/dL (ref 12.0–15.0)
MCH: 32 pg (ref 26.0–34.0)
MCHC: 34.5 g/dL (ref 30.0–36.0)
MCV: 92.8 fL (ref 78.0–100.0)
Platelets: 193 10*3/uL (ref 150–400)
RBC: 4.03 MIL/uL (ref 3.87–5.11)
RDW: 11.6 % (ref 11.5–15.5)
WBC: 4.8 10*3/uL (ref 4.0–10.5)

## 2010-10-26 LAB — BASIC METABOLIC PANEL
BUN: 11 mg/dL (ref 6–23)
CO2: 26 mEq/L (ref 19–32)
Calcium: 8.7 mg/dL (ref 8.4–10.5)
Chloride: 104 mEq/L (ref 96–112)
Creatinine, Ser: 0.62 mg/dL (ref 0.4–1.2)
GFR calc Af Amer: >60 mL/min (ref >60)
GFR calc non Af Amer: >60 mL/min (ref >60)
Glucose, Bld: 112 mg/dL — ABNORMAL HIGH (ref 70–99)
Potassium: 3.5 mEq/L (ref 3.5–5.1)
Sodium: 138 mEq/L (ref 135–145)

## 2010-10-30 ENCOUNTER — Other Ambulatory Visit: Payer: Self-pay | Admitting: Internal Medicine

## 2010-10-30 ENCOUNTER — Telehealth: Payer: Self-pay

## 2010-10-30 ENCOUNTER — Encounter: Payer: Self-pay | Admitting: Family Medicine

## 2010-10-30 DIAGNOSIS — R1013 Epigastric pain: Secondary | ICD-10-CM

## 2010-10-30 NOTE — Telephone Encounter (Signed)
Spoke with pt, she is scheduled for U/S of abdomen 11/06/10@9am , pt to arrive at 8:45am at [facility] F/U appt with Dr. Marina Goodell scheduled for 11/28/10@10am . Pt aware of appts.

## 2010-10-31 ENCOUNTER — Ambulatory Visit (INDEPENDENT_AMBULATORY_CARE_PROVIDER_SITE_OTHER): Payer: Medicare Other | Admitting: Family Medicine

## 2010-10-31 ENCOUNTER — Encounter: Payer: Self-pay | Admitting: Family Medicine

## 2010-10-31 ENCOUNTER — Ambulatory Visit: Payer: Medicare Other | Admitting: Family Medicine

## 2010-10-31 DIAGNOSIS — R634 Abnormal weight loss: Secondary | ICD-10-CM

## 2010-10-31 DIAGNOSIS — E876 Hypokalemia: Secondary | ICD-10-CM

## 2010-10-31 DIAGNOSIS — K589 Irritable bowel syndrome without diarrhea: Secondary | ICD-10-CM

## 2010-10-31 DIAGNOSIS — E0590 Thyrotoxicosis, unspecified without thyrotoxic crisis or storm: Secondary | ICD-10-CM

## 2010-10-31 DIAGNOSIS — R1013 Epigastric pain: Secondary | ICD-10-CM

## 2010-10-31 DIAGNOSIS — K5289 Other specified noninfective gastroenteritis and colitis: Secondary | ICD-10-CM

## 2010-10-31 DIAGNOSIS — R599 Enlarged lymph nodes, unspecified: Secondary | ICD-10-CM

## 2010-10-31 DIAGNOSIS — R197 Diarrhea, unspecified: Secondary | ICD-10-CM

## 2010-10-31 DIAGNOSIS — D649 Anemia, unspecified: Secondary | ICD-10-CM

## 2010-10-31 DIAGNOSIS — M1610 Unilateral primary osteoarthritis, unspecified hip: Secondary | ICD-10-CM

## 2010-10-31 DIAGNOSIS — N390 Urinary tract infection, site not specified: Secondary | ICD-10-CM

## 2010-10-31 DIAGNOSIS — R079 Chest pain, unspecified: Secondary | ICD-10-CM

## 2010-10-31 DIAGNOSIS — E785 Hyperlipidemia, unspecified: Secondary | ICD-10-CM

## 2010-10-31 DIAGNOSIS — R630 Anorexia: Secondary | ICD-10-CM

## 2010-10-31 DIAGNOSIS — R002 Palpitations: Secondary | ICD-10-CM

## 2010-10-31 DIAGNOSIS — E559 Vitamin D deficiency, unspecified: Secondary | ICD-10-CM

## 2010-10-31 DIAGNOSIS — R591 Generalized enlarged lymph nodes: Secondary | ICD-10-CM

## 2010-10-31 MED ORDER — ALPRAZOLAM 0.25 MG PO TABS: 0.2500 mg | ORAL_TABLET | Freq: Three times a day (TID) | ORAL | Status: DC | PRN

## 2010-10-31 NOTE — Patient Instructions (Signed)
Esophagitis (Heartburn) Esophagitis (heartburn) is a painful, burning sensation in the chest. It may feel worse in certain positions, such as lying down or bending over. It is caused by stomach acid backing up into the tube that carries food from the mouth down to the stomach (lower esophagus). TREATMENT There are a number of non-prescription medicines used to treat heartburn, including:  Antacids.   Acid reducers (also called H-2 blockers).   Proton-pump inhibitors.  HOME CARE INSTRUCTIONS  Raise the head of your bead by putting blocks under the legs.   Eat 2-3 hours before going to bed.   Stop smoking.   Try to reach and maintain a healthy weight.   Do not eat just a few very large meals. Instead, eat many smaller meals throughout the day.   Try to identify foods and beverages that make your symptoms worse, and avoid these.   Avoid tight clothing.   Do not exercise right after eating.  SEEK IMMEDIATE MEDICAL CARE IF YOU:  Have severe chest pain that goes down your arm, or into your jaw or neck.   Feel sweaty, dizzy, or lightheaded.   Are short of breath.   Throw up (vomit) blood.   Have difficulty or pain with swallowing.   Have bloody or black, tarry stools.   Have bouts of heartburn more than three times a week for more than two weeks.  Document Released: 08/23/2004 Document Re-Released: 10/10/2009 [facility] Patient Information 2011 [place], [place]. Call with any concerns

## 2010-10-31 NOTE — Procedures (Signed)
Summary: Upper Endoscopy  Patient: [name] Note: All result statuses are Final unless otherwise noted.  Tests: (1) Upper Endoscopy (EGD)   EGD Upper Endoscopy       DONE     [facility]     [address]     [place], [place]  [address]          ENDOSCOPY PROCEDURE REPORT          PATIENT:  [name], [name]  MR#:  [id number]     BIRTHDATE:  [birth year], 69 yrs. old  GENDER:  female          ENDOSCOPIST:  Wilhemina Bonito. Eda Keys, MD     Referred by:  Rollene Rotunda, M.D.          PROCEDURE DATE:  10/26/2010     PROCEDURE:  EGD with biopsy, 43239     ASA CLASS:  Class II     INDICATIONS:  epigastric pain, chest pain, diarrhea          MEDICATIONS:   Fentanyl 75 mcg IV, Versed 7 mg IV     TOPICAL ANESTHETIC:  Cetacaine Spray          DESCRIPTION OF PROCEDURE:   After the risks benefits and     alternatives of the procedure were thoroughly explained, informed     consent was obtained.  The EG-[id number] ([id number]) endoscope was     introduced through the mouth and advanced to the third portion of     the duodenum, without limitations.  The instrument was slowly     withdrawn as the mucosa was fully examined.     <<PROCEDUREIMAGES>>          The upper, middle, and distal third of the esophagus were     carefully inspected and no abnormalities were noted. The z-line     was well seen at the GEJ. The endoscope was pushed into the fundus     which was normal including a retroflexed view. The antrum,gastric     body, first and second part of the duodenum were unremarkable.     Retroflexed views revealed no abnormalities.    The scope was then     withdrawn from the patient and the procedure completed.          COMPLICATIONS:  None          ENDOSCOPIC IMPRESSION:     1) Normal EGD     2) No cause for pain found     RECOMMENDATIONS:     1) Prilosec OTC 20mg  daily for 8 weeks     2) Abdominal Ultrasound as out patient "epigastric / chest pain"     - my office to  arrange     3) Call for office visit to be seen in 4-6 weeks (assess     response to Prilosec and further evaluate chronic diarrhea)     4) OK to D/C home today from GI perspective          ______________________________     Wilhemina Bonito. Eda Keys, MD          CC:  Rollene Rotunda, MD; Reuel Derby, MD; The Patient          n.     eSIGNED:   Wilhemina Bonito. Eda Keys at 10/26/2010 01:50 PM          Jacinto Halim, [id number]  Note: An exclamation mark (!) indicates a result that was  not dispersed into the flowsheet. Document Creation Date: 10/26/2010 1:51 PM _______________________________________________________________________  (1) Order result status: Final Collection or observation date-time: 10/26/2010 13:45 Requested date-time:  Receipt date-time:  Reported date-time:  Referring Physician:   Ordering Physician: Fransico Setters [id number]) Specimen Source:  Source: Launa Grill Order Number: [id number] Lab site:

## 2010-11-01 ENCOUNTER — Encounter: Payer: Self-pay | Admitting: Family Medicine

## 2010-11-01 DIAGNOSIS — Z8619 Personal history of other infectious and parasitic diseases: Secondary | ICD-10-CM | POA: Insufficient documentation

## 2010-11-01 DIAGNOSIS — R591 Generalized enlarged lymph nodes: Secondary | ICD-10-CM

## 2010-11-01 HISTORY — DX: Generalized enlarged lymph nodes: R59.1

## 2010-11-01 LAB — URINALYSIS, ROUTINE W REFLEX MICROSCOPIC
Bilirubin Urine: NEGATIVE
Ketones, ur: NEGATIVE
Nitrite: NEGATIVE
Specific Gravity, Urine: <=1.005 (ref 1.000–1.030)
Total Protein, Urine: NEGATIVE
Urine Glucose: NEGATIVE
Urobilinogen, UA: 0.2 (ref 0.0–1.0)
pH: 5.5 (ref 5.0–8.0)

## 2010-11-01 LAB — RENAL FUNCTION PANEL
Albumin: 4.5 g/dL (ref 3.5–5.2)
BUN: 13 mg/dL (ref 6–23)
CO2: 31 mEq/L (ref 19–32)
Calcium: 9.6 mg/dL (ref 8.4–10.5)
Chloride: 98 mEq/L (ref 96–112)
Creatinine, Ser: 0.7 mg/dL (ref 0.4–1.2)
GFR: 86.61 mL/min (ref >60.00)
Glucose, Bld: 78 mg/dL (ref 70–99)
Phosphorus: 3.7 mg/dL (ref 2.3–4.6)
Potassium: 4.5 mEq/L (ref 3.5–5.1)
Sodium: 138 mEq/L (ref 135–145)

## 2010-11-01 LAB — CBC WITH DIFFERENTIAL/PLATELET
Basophils Absolute: 0 10*3/uL (ref 0.0–0.1)
Basophils Relative: 0.4 % (ref 0.0–3.0)
Eosinophils Absolute: 0 10*3/uL (ref 0.0–0.7)
Eosinophils Relative: 0.7 % (ref 0.0–5.0)
HCT: 39.8 % (ref 36.0–46.0)
Hemoglobin: 13.5 g/dL (ref 12.0–15.0)
Lymphocytes Relative: 33 % (ref 12.0–46.0)
Lymphs Abs: 1.9 10*3/uL (ref 0.7–4.0)
MCHC: 33.8 g/dL (ref 30.0–36.0)
MCV: 99.6 fl (ref 78.0–100.0)
Monocytes Absolute: 0.3 10*3/uL (ref 0.1–1.0)
Monocytes Relative: 4.8 % (ref 3.0–12.0)
Neutro Abs: 3.6 10*3/uL (ref 1.4–7.7)
Neutrophils Relative %: 61.1 % (ref 43.0–77.0)
Platelets: 112 10*3/uL — ABNORMAL LOW (ref 150.0–400.0)
RBC: 4 Mil/uL (ref 3.87–5.11)
RDW: 11.9 % (ref 11.5–14.6)
WBC: 6.3 10*3/uL (ref 4.5–10.5)

## 2010-11-01 LAB — C-REACTIVE PROTEIN: CRP: 0 mg/dL (ref <0.6)

## 2010-11-01 LAB — SEDIMENTATION RATE: Sed Rate: 10 mm/hr (ref 0–22)

## 2010-11-01 LAB — T4, FREE: Free T4: 0.96 ng/dL (ref 0.60–1.60)

## 2010-11-01 LAB — TSH: TSH: 1.33 u[IU]/mL (ref 0.35–5.50)

## 2010-11-01 LAB — H. PYLORI ANTIBODY, IGG: H Pylori IgG: POSITIVE

## 2010-11-01 NOTE — Assessment & Plan Note (Signed)
Patient agrees to have level checked but in the meantime she is asked not to worry about these numbers and to eat anything that sounds good.

## 2010-11-01 NOTE — Assessment & Plan Note (Signed)
Will need to check levels before further treatment is initiated

## 2010-11-01 NOTE — Assessment & Plan Note (Signed)
Needs levels rechecked, she does note she felt better when this was being actively treated

## 2010-11-01 NOTE — Assessment & Plan Note (Signed)
Patient in regular rhythm today, she has an appt with cardiology later this month, will not initiate any new therapies today but she is to report if she has any further episodes of feeling like her heart is racing.

## 2010-11-01 NOTE — Assessment & Plan Note (Signed)
They note a 30# weight loss over the last 1-2 years. She will add Boost and we will perform some labs and CT scanning to further evaluate.

## 2010-11-01 NOTE — Assessment & Plan Note (Signed)
Recently diagnosed while in hospital, does have significant second hand smoke exposure.

## 2010-11-01 NOTE — Assessment & Plan Note (Signed)
Patient has been struggling with diarrhea will check stool cultures

## 2010-11-01 NOTE — Assessment & Plan Note (Signed)
Patient has persistent trouble with her hips. Pain is daily, will check xrays

## 2010-11-01 NOTE — Assessment & Plan Note (Signed)
Patient accompanied by her daughter and they report a several year history of intermittent diarrhea and constipation. Diarrhea dominates and unfortunately after having a colonoscopy and finding no cause her solution has been to stop eating. She's just a couple of crackers a day and is continually losing weight. She does acknowledge she doesn't really get hungry. She denies nausea and vomiting. She does develop she's under a great deal of stress with her husband and that the diarrhea is worse when things are more stressful. No bloody or tarry stool is noted. She is asked to start to boost supplements daily as tolerated to add a fiber supplement such as Benefiber twice daily and a probiotic daily we will check stool cultures as well as an H. pylori and treat accordingly.

## 2010-11-01 NOTE — Progress Notes (Signed)
[name] [id number] 10-27-1940 11/01/2010      Progress Note New Patient  Subjective  Chief Complaint  Chief Complaint  Patient presents with  . Back Pain    follow up from hospital    HPI  Patient is in today for hospital followup with her daughter in urgent new patient appointment. She was seen briefly last week with complaints of chest pain but it had not been established patient yet. Her chest pain was concerning and had been S. cleaning also cardiology agreed to admit her to route. She has ruled out she did undergo labs as well as angiogram showed no blockages and she's now been sent home for further analysis. He did do endoscopy was in the hospital as well which was also largely unremarkable. Her chest pain is better than at her last visit but not resolved. She describes it as sharp and substernal coming and going. She does also complain of an inflamed lymph node in her left axilla which she's had for one year and is being followed serially with ultrasound mammogram and even a fine-needle aspiration at [facility]. So far all they've been able to diagnosis inflamed lymph node. She is concerned that this is contributory chest pain although the pain is in a different location. On further questioning she does acknowledge worsening shortness of breath. Even minimal exertion at home leads to shortness of breath. No shortness of breath at rest. She is nonsmoker but has significant secondhand smoke history and was just told her imaging in the hospital showed some emphysema. Her daughter is concerned about a 30 pound weight loss in the last 1-2 years. She has chronic diarrhea and chronic stress. She's had a colonoscopy which did not show any obvious cause for persistent diarrhea. She wobbles blue stool available they're never bloody black or tarry. She notes whenever she eats anything and food runs right through her to the point where she really has stopped eating. She socially roughly 3 peanut butter  crackers a day so she does not have diarrhea. She does acknowledge she doesn't get hungry he is struggling with anorexia. Assessment epigastric discomfort distention and bloating as well. She's had trouble DTRs in the past and didn't feel she saw some blood in her urine during her recent hospitalization but did not have a urinalysis done. Presently denies hematuria or dysuria. He is swelling with high anxiety, depression and insomnia due to her husband's ailing health. He tried Lexapro in the past but she did not like the nadir feels that she now uses 1-2 Xanax daily just to help with the anxiety. Her daughter confirms she cries easily and on most days the patient agrees.   Past Medical History  Diagnosis Date  . HYPERTHYROIDISM 03/11/2007  . Irritable bowel syndrome 05/19/2009  . HYPERLIPIDEMIA 03/11/2007  . ANEMIA 05/19/2009  . VITAMIN D DEFICIENCY 05/19/2009  . WEIGHT LOSS 03/11/2007  . HYPOKALEMIA 05/19/2009  . Diarrhea 05/19/2009  . ARTHRITIS, HIP 05/19/2009  . ANXIETY DEPRESSION 05/19/2009  . COLITIS 05/22/2007  . UTI 03/11/2007  . Palpitations 10/24/2010  . Lymphadenopathy 11/01/2010  . Chest pain 10/24/2010  . Emphysema 11/01/2010    Past Surgical History  Procedure Date  . Abdominal hysterectomy     total  . Appendectomy     Family History  Problem Relation Age of Onset  . Alzheimer's disease Mother   . Other Father     blood clot  . Cancer Sister     breast  . Cancer Maternal Aunt  breast  . Heart attack Paternal Grandfather   . Cancer Sister     bladder ca  . Cancer Sister     breast    History   Social History  . Marital Status: Married    Spouse Name: N/A    Number of Children: N/A  . Years of Education: N/A   Occupational History  . Not on file.   Social History Main Topics  . Smoking status: Never Smoker   . Smokeless tobacco: Never Used  . Alcohol Use: No  . Drug Use: No  . Sexually Active: No   Other Topics Concern  . Not on file   Social  History Narrative  . No narrative on file    Current Outpatient Prescriptions on File Prior to Visit  Medication Sig Dispense Refill  . diphenoxylate-atropine (LOMOTIL) 2.5-0.025 MG per tablet Take 1 tablet by mouth as needed. Take one or two tablets po tid       . ergocalciferol (VITAMIN D2) 50000 UNITS capsule Take 50,000 Units by mouth once a week. X 12 weeks       . escitalopram (LEXAPRO) 10 MG tablet Take by mouth as directed.        . hyoscyamine (LEVBID) 0.375 MG 12 hr tablet Take 0.375 mg by mouth daily. To prevent diarrhea         No Known Allergies  Review of Systems  Review of Systems  Constitutional: Positive for weight loss and malaise/fatigue. Negative for fever and chills.  HENT: Negative for hearing loss, nosebleeds and congestion.   Eyes: Negative for discharge.  Respiratory: Negative for cough, sputum production, shortness of breath and wheezing.   Cardiovascular: Negative for chest pain, palpitations and leg swelling.  Gastrointestinal: Positive for abdominal pain, diarrhea and constipation. Negative for heartburn, nausea, vomiting, blood in stool and melena.  Genitourinary: Positive for hematuria. Negative for dysuria, urgency and frequency.  Musculoskeletal: Negative for myalgias, back pain and falls.  Skin: Negative for rash.  Neurological: Negative for dizziness, tremors, sensory change, focal weakness, loss of consciousness, weakness and headaches.  Endo/Heme/Allergies: Negative for polydipsia. Does not bruise/bleed easily.  Psychiatric/Behavioral: Positive for depression. Negative for suicidal ideas, hallucinations and substance abuse. The patient is nervous/anxious and has insomnia.     Objective  BP 121/70  Pulse 65  Temp(Src) 97.6 F (36.4 C) (Oral)  Ht 5\' 1"  (1.549 m)  Wt 95 lb 6.4 oz (43.273 kg)  BMI 18.03 kg/m2  SpO2 98%  Physical Exam  Physical Exam  Constitutional: She is oriented to person, place, and time. Vital signs are normal. She  appears not lethargic.  Non-toxic appearance. No distress.       Thin, frail  HENT:  Head: Normocephalic and atraumatic.  Right Ear: External ear normal.  Left Ear: External ear normal.  Nose: Nose normal.  Mouth/Throat: Oropharynx is clear and moist. No oropharyngeal exudate.  Eyes: Conjunctivae and EOM are normal. Pupils are equal, round, and reactive to light. Right eye exhibits no discharge. Left eye exhibits no discharge. No scleral icterus.  Neck: Normal range of motion. Neck supple. No thyromegaly present.  Cardiovascular: Normal rate, regular rhythm, normal heart sounds and intact distal pulses.   No murmur heard. Pulmonary/Chest: Effort normal and breath sounds normal. No respiratory distress. She has no wheezes. She has no rales. She exhibits tenderness.  Abdominal: Soft. Bowel sounds are normal. She exhibits no distension and no mass. There is tenderness. There is no rebound and no guarding.  Musculoskeletal: Normal range of motion. She exhibits no edema and no tenderness.  Lymphadenopathy:    She has no cervical adenopathy.  Neurological: She is alert and oriented to person, place, and time. She has normal reflexes. She appears not lethargic. No cranial nerve deficit. She exhibits normal muscle tone. Gait normal. Coordination normal.  Skin: Skin is warm and dry. No rash noted. She is not diaphoretic. No erythema.  Psychiatric: Mood, memory, affect and judgment normal.       Assessment & Plan  Irritable bowel syndrome Patient accompanied by her daughter and they report a several year history of intermittent diarrhea and constipation. Diarrhea dominates and unfortunately after having a colonoscopy and finding no cause her solution has been to stop eating. She's just a couple of crackers a day and is continually losing weight. She does acknowledge she doesn't really get hungry. She denies nausea and vomiting. She does develop she's under a great deal of stress with her  husband and that the diarrhea is worse when things are more stressful. No bloody or tarry stool is noted. She is asked to start to boost supplements daily as tolerated to add a fiber supplement such as Benefiber twice daily and a probiotic daily we will check stool cultures as well as an H. pylori and treat accordingly.  HYPERTHYROIDISM Needs to have her levels checked to further evaluate. Reassess at next visit.  HYPERLIPIDEMIA Patient agrees to have level checked but in the meantime she is asked not to worry about these numbers and to eat anything that sounds good.   ANEMIA Will need to check levels before further treatment is initiated  WEIGHT LOSS They note a 30# weight loss over the last 1-2 years. She will add Boost and we will perform some labs and CT scanning to further evaluate.  VITAMIN D DEFICIENCY Needs levels rechecked, she does note she felt better when this was being actively treated  UTI Patient reports she had some severe UTIs with trouble clearing them in the past, no trouble recently until she was just hospitalized and she thought she saw some blood in her urine, no UA was performed in hospital so we will check one with labs, she is to report any worsening symptoms  Palpitations Patient in regular rhythm today, she has an appt with cardiology later this month, will not initiate any new therapies today but she is to report if she has any further episodes of feeling like her heart is racing.  HYPOKALEMIA Check a renal panel with labs  COLITIS Patient has been struggling with diarrhea will check stool cultures  Lymphadenopathy Has been following these with [facility] for about a year with serial MGMs and [place]. Minimal enlarged LN noted upon palpation. If no other cause for her weight loss and CP is found may need to consider biopsy to further evaluate  Chest pain Patient is having less CP but does have some substernal discomfort at times still. Describes it as sharp and  short lived at this time. She is encourged to follow up with GI and Cardiology as scheduled and we will order CT chest and abd/pelvis due to this with anorexia, weight loss and diarrhea. Also noting increasing SOB and in hospital they made note of emphysema for the first time  Emphysema Recently diagnosed while in hospital, does have significant second hand smoke exposure.   ARTHRITIS, HIP Patient has persistent trouble with her hips. Pain is daily, will check xrays

## 2010-11-01 NOTE — Assessment & Plan Note (Signed)
Check a renal panel with labs

## 2010-11-01 NOTE — Assessment & Plan Note (Signed)
Patient reports she had some severe UTIs with trouble clearing them in the past, no trouble recently until she was just hospitalized and she thought she saw some blood in her urine, no UA was performed in hospital so we will check one with labs, she is to report any worsening symptoms

## 2010-11-01 NOTE — Procedures (Signed)
[name], [name]                ACCOUNT NO.:  [id number]  MEDICAL RECORD NO.:  [id number]           PATIENT TYPE:  I  LOCATION:  2029                         FACILITY:  [facility]  PHYSICIAN:  Lorine Bears, MD     DATE OF BIRTH:  05-20-1941  DATE OF PROCEDURE: DATE OF DISCHARGE:                           CARDIAC CATHETERIZATION   REFERRING PHYSICIAN:  Rollene Rotunda, MD, [facility]  PROCEDURES PERFORMED: 1. Left heart catheterization. 2. Coronary angiography. 3. Left ventricular angiography.  ACCESS:  Right femoral artery.  INDICATION AND CLINICAL HISTORY:  This is a 70 year old female with no previous cardiac history.  She presented with substernal chest tightness and dyspnea.  This has been progressive over the last month and got worse yesterday and she decided to come to the emergency room.  She ruled out for myocardial infarction.  Her symptoms were highly suggestive of unstable angina.  Thus, cardiac catheterization was recommended.  Risks, benefits, and alternatives were discussed with the patient.  STUDY DETAILS:  A standard informed consent was obtained.  The right femoral area was prepped in a sterile fashion.  It was anesthetized with 1% lidocaine.  A 5-French sheath was placed in the right femoral artery after an anterior puncture.  Coronary angiography was performed with a JL-4, JR-4, and a pigtail catheter.  All catheter exchanges were done over the wire.  Left ventricular angiography was performed in the RAO 30 position.  The patient tolerated the procedure well with no immediate complications.  STUDY FINDINGS:  Hemodynamic findings:  Left ventricular pressure was 142/4 with a left ventricular end-diastolic pressure of 11 mmHg. Aortic pressure was 138/64 with a mean pressure of 92 mmHg.  Left ventricular angiography:  This showed normal LV systolic function with an estimated ejection fraction of 65% with no significant mitral regurgitation.  CORONARY  ANGIOGRAPHY:  Left main coronary artery:  The vessel was normal in size and free of significant disease. Left anterior descending artery:  The vessel was normal size with no significant coronary artery disease.  The first diagonal is a large- sized branch and free of significant disease.  Second diagonal is also large and free of significant disease.  It covers the third diagonal distribution. Left circumflex artery:  The vessel was normal in size with minor irregularities, but no obstructive disease.  OM-1 is a small-sized branch as well as OM-2.  Both of them are free of significant disease. OM-3 is a normal-sized branch and free of significant disease. Right coronary artery:  The vessel was normal in size and dominant. This was overall normal with no evidence of atherosclerosis.  STUDY CONCLUSIONS: 1. No significant coronary artery disease. 2. Normal LV systolic function.  Normal left ventricular end-diastolic     pressure. 3. Noncardiac chest pain. 4. Consider evaluation for noncardiac etiology of her chest pain and     dyspnea.  The patient might require pulmonary evaluation and     evaluation for pulmonary embolism if there is a high clinical     suspicion.     Lorine Bears, MD     MA/MEDQ  D:  10/25/2010  T:  10/26/2010  Job:  [id number]  cc:   Rollene Rotunda, MD, [facility]  Electronically Signed by Lorine Bears MD on 11/01/2010 03:43:44 PM

## 2010-11-01 NOTE — Assessment & Plan Note (Signed)
Has been following these with [facility] for about a year with serial MGMs and [place]. Minimal enlarged LN noted upon palpation. If no other cause for her weight loss and CP is found may need to consider biopsy to further evaluate

## 2010-11-01 NOTE — Assessment & Plan Note (Addendum)
Patient is having less CP but does have some substernal discomfort at times still. Describes it as sharp and short lived at this time. She is encourged to follow up with GI and Cardiology as scheduled and we will order CT chest and abd/pelvis due to this with anorexia, weight loss and diarrhea. Also noting increasing SOB and in hospital they made note of emphysema for the first time

## 2010-11-01 NOTE — Assessment & Plan Note (Signed)
Needs to have her levels checked to further evaluate. Reassess at next visit.

## 2010-11-01 NOTE — Discharge Summary (Addendum)
[name], [name]                ACCOUNT NO.:  [id number]  MEDICAL RECORD NO.:  [id number]           PATIENT TYPE:  I  LOCATION:  2029                         FACILITY:  [facility]  PHYSICIAN:  Rollene Rotunda, MD, FACCDATE OF BIRTH:  1941/05/07  DATE OF ADMISSION:  10/24/2010 DATE OF DISCHARGE:  10/26/2010                              DISCHARGE SUMMARY   DISCHARGE DIAGNOSES: 1. Chest pain.     a.     Cardiac catheterization on October 25, 2010 showing no      significant coronary artery disease, and normal left ventricular      systolic function, negative for myocardial infarction.     b.     October 26, 2010, for outpatient GI followup/abdominal      ultrasound with [facility]. 2. Irritable bowel syndrome with diarrhea, chronically, for outpatient     GI followup. 3. Osteoarthritis. 4. Status post hysterectomy. 5. Status post appendectomy.  ADMISSION HOSPITAL COURSE:  [name] is a 70 year old female with a past medical history that includes IBS and osteoarthritis with no prior cardiac history, who presented to [facility] with complaints of episodic chest discomfort, exertional from the past few months, this is a clammy feeling with some shortness of breath.  Chest pain will generally last an hour, and be relieved by rest.  She developed chest discomfort on the evening before admission after a light supper, that was not pleuritic, not associated with deep inspiration or positional. She presented to her PCP with these complaints and was transported by EMS.  There was some question of a irregular heart rate; however, EKGs were reviewed from her PCP and there was no clear atrial fibrillation. The EKG at doctor's office read it as atrial flutter with artifact made it difficult to attribute.  She was admitted to the hospital for further evaluation, placed on IV heparin and nitro.  Low-dose beta-blocker was initiated; however, she had borderline low blood pressure  this admission.  Cardiac enzymes remained negative x5.  Because of the nature of her symptoms, she underwent cardiac catheterization on October 25, 2010 by Dr. Kirke Corin, which showed no significant coronary artery disease with normal LV systolic function.  There did not appear to be clinical suspicion for pulmonary embolism given the fact that she was not tachycardic, not tachypneic, and had normal oxygen saturations without lower extremity edema.  Her chest x-ray did suggest emphysematous changes.  Gastroenterology was consulted for possible further evaluation, who did suggest outpatient followup with her PCP for possible PFTs given emphysematous changes on chest x-ray.  EGD was performed, which demonstrated no cause for pain found, the study was normal.  They recommended to Prilosec over the counter 20 mg daily as well as an abdominal ultrasound and follow up with their office.  Dr. Antoine Poche has seen and examined her today and feels she is stable for discharge.  DISCHARGE LABS:  WBC 4.8, hemoglobin 12.9, hematocrit 37.4, platelet count 193.  Sodium 130, potassium 3.5, chloride 104, CO2 of 26, glucose 112, BUN 11, creatinine 0.62.  Cardiac enzymes negative x5.  Total cholesterol 192, triglycerides 355, HDL 65, LDL  176.  STUDIES: 1. Chest x-ray on October 24, 2010 showed no acute cardiopulmonary     process, emphysematous change. 2. Cardiac catheterization on October 25, 2010 showed no significant     coronary artery disease.  Normal LV systolic function.  Normal     LVEDP.  Noncardiac chest pain. 3. EGD on October 26, 2010 was normal.  DISCHARGE MEDICATIONS: 1. Omeprazole 20 mg daily. 2. Aspirin 81 mg daily. 3. Motrin 200 mg 1-2 tablets q.8 hours p.r.n.  DISPOSITION:  [name] will be discharged in stable condition to home. She is not to lift anything over 5 pounds for 1 week or participate in sexual activity for 1 week.  She is to follow a heart-healthy, low- sodium diet and to call or  return if she notices any pain, swelling, bleeding, or pus at her cath site.  She will follow up with Tereso Newcomer, PA-C for groin check post hospital on November 16, 2010 at 9:30 a.m.  She only need to see [place] p.r.n. from that on.  She will be instructed to follow up with [facility] and is recommended to call their office for an appointment as well as schedule an outpatient ultrasound as instructed by Dr. Marina Goodell.  She is also instructed to see her PCP for evaluation of her lung function given her emphysematous changes on chest x-ray and her history of secondhand smoke exposure.  DURATION OF DISCHARGE ENCOUNTER:  Greater than 30 minutes including physician and PA time.     Ronie Spies, P.A.C.   ______________________________ Rollene Rotunda, MD, [facility]    DD/MEDQ  D:  10/26/2010  T:  10/27/2010  Job:  [id number]  Electronically Signed by Ronie Spies  on 11/01/2010 02:32:07 PM Electronically Signed by Rollene Rotunda MD [facility] on 11/24/2010 11:44:31 AM

## 2010-11-02 MED ORDER — AMOXICILLIN 500 MG PO TABS: 500.0000 mg | ORAL_TABLET | Freq: Two times a day (BID) | ORAL | Status: DC

## 2010-11-02 MED ORDER — CLARITHROMYCIN 500 MG PO TABS: 500.0000 mg | ORAL_TABLET | Freq: Two times a day (BID) | ORAL | Status: DC

## 2010-11-02 MED ORDER — OMEPRAZOLE 20 MG PO CPDR: 20.0000 mg | DELAYED_RELEASE_CAPSULE | Freq: Two times a day (BID) | ORAL | Status: DC

## 2010-11-02 NOTE — Progress Notes (Signed)
Addended by: Josph Macho on: 11/02/2010 11:39 AM   Modules accepted: Orders

## 2010-11-06 ENCOUNTER — Other Ambulatory Visit (HOSPITAL_COMMUNITY): Payer: Medicare Other

## 2010-11-06 ENCOUNTER — Ambulatory Visit (HOSPITAL_COMMUNITY)
Admission: RE | Admit: 2010-11-06 | Discharge: 2010-11-06 | Disposition: A | Discharge status: Home / Self Care | Payer: Medicare Other | Source: Ambulatory Visit | Attending: Internal Medicine | Admitting: Internal Medicine

## 2010-11-06 DIAGNOSIS — R1013 Epigastric pain: Secondary | ICD-10-CM | POA: Insufficient documentation

## 2010-11-07 ENCOUNTER — Other Ambulatory Visit: Payer: Medicare Other

## 2010-11-07 ENCOUNTER — Telehealth: Payer: Self-pay

## 2010-11-07 NOTE — Telephone Encounter (Signed)
Pt aware of results and recommendations per Dr. Marina Goodell.

## 2010-11-07 NOTE — Telephone Encounter (Signed)
Message copied by Chrystie Nose on Tue Nov 07, 2010 10:20 AM ------      Message from: Yancey Flemings      Created: Tue Nov 07, 2010 10:16 AM       Let pt know U.S. Was unremarkable. Stay on Prilosec daily and keep GI f/u appt as scheduled

## 2010-11-08 ENCOUNTER — Other Ambulatory Visit: Payer: Medicare Other

## 2010-11-09 ENCOUNTER — Ambulatory Visit
Admission: RE | Admit: 2010-11-09 | Discharge: 2010-11-09 | Disposition: A | Discharge status: Home / Self Care | Payer: Medicare Other | Source: Ambulatory Visit | Attending: Family Medicine | Admitting: Family Medicine

## 2010-11-09 ENCOUNTER — Telehealth: Payer: Self-pay | Admitting: *Deleted

## 2010-11-09 DIAGNOSIS — K5289 Other specified noninfective gastroenteritis and colitis: Secondary | ICD-10-CM

## 2010-11-09 DIAGNOSIS — R630 Anorexia: Secondary | ICD-10-CM

## 2010-11-09 DIAGNOSIS — R634 Abnormal weight loss: Secondary | ICD-10-CM

## 2010-11-09 DIAGNOSIS — K589 Irritable bowel syndrome without diarrhea: Secondary | ICD-10-CM

## 2010-11-09 DIAGNOSIS — R319 Hematuria, unspecified: Secondary | ICD-10-CM

## 2010-11-09 DIAGNOSIS — R1013 Epigastric pain: Secondary | ICD-10-CM

## 2010-11-09 MED ORDER — IOHEXOL 300 MG/ML  SOLN
95.0000 mL | Freq: Once | INTRAMUSCULAR | Status: AC | PRN
  Administered 2010-11-09: 95 mL via INTRAVENOUS

## 2010-11-09 NOTE — Telephone Encounter (Addendum)
PC from [facility].  Need new order for CT w contrast.  New order entered.  Order entered by Dr. Abner Greenspan for [phone], but referral reads [phone].   Imaging will call if they need any additional information.

## 2010-11-13 NOTE — Progress Notes (Signed)
Pt informed

## 2010-11-15 ENCOUNTER — Encounter: Payer: Self-pay | Admitting: Family Medicine

## 2010-11-15 ENCOUNTER — Encounter: Payer: Self-pay | Admitting: Cardiology

## 2010-11-15 ENCOUNTER — Ambulatory Visit (INDEPENDENT_AMBULATORY_CARE_PROVIDER_SITE_OTHER): Payer: Medicare Other | Admitting: Family Medicine

## 2010-11-15 DIAGNOSIS — R634 Abnormal weight loss: Secondary | ICD-10-CM

## 2010-11-15 DIAGNOSIS — L509 Urticaria, unspecified: Secondary | ICD-10-CM

## 2010-11-15 DIAGNOSIS — K5289 Other specified noninfective gastroenteritis and colitis: Secondary | ICD-10-CM

## 2010-11-15 DIAGNOSIS — R002 Palpitations: Secondary | ICD-10-CM

## 2010-11-15 DIAGNOSIS — R079 Chest pain, unspecified: Secondary | ICD-10-CM

## 2010-11-15 DIAGNOSIS — B029 Zoster without complications: Secondary | ICD-10-CM

## 2010-11-15 DIAGNOSIS — A048 Other specified bacterial intestinal infections: Secondary | ICD-10-CM

## 2010-11-15 DIAGNOSIS — J438 Other emphysema: Secondary | ICD-10-CM

## 2010-11-15 DIAGNOSIS — M1610 Unilateral primary osteoarthritis, unspecified hip: Secondary | ICD-10-CM

## 2010-11-15 HISTORY — DX: Zoster without complications: B02.9

## 2010-11-15 HISTORY — DX: Other specified bacterial intestinal infections: A04.8

## 2010-11-15 MED ORDER — METHYLPREDNISOLONE 4 MG PO KIT: PACK | ORAL | Status: AC

## 2010-11-15 MED ORDER — ALBUTEROL 90 MCG/ACT IN AERS: 2.0000 | INHALATION_SPRAY | Freq: Four times a day (QID) | RESPIRATORY_TRACT | Status: DC | PRN

## 2010-11-15 MED ORDER — LORATADINE 10 MG PO TABS: 10.0000 mg | ORAL_TABLET | Freq: Every day | ORAL | Status: DC

## 2010-11-15 MED ORDER — VALACYCLOVIR HCL 1 G PO TABS: 1000.0000 mg | ORAL_TABLET | Freq: Two times a day (BID) | ORAL | Status: DC

## 2010-11-15 MED ORDER — VALACYCLOVIR HCL 1 G PO TABS: 1000.0000 mg | ORAL_TABLET | Freq: Three times a day (TID) | ORAL | Status: DC

## 2010-11-15 NOTE — Assessment & Plan Note (Signed)
Patient with a cluster of vesicular lesions on her right hip, these were her fist lesions, we will treat these like Zoster with a course of Acyclovir

## 2010-11-15 NOTE — Assessment & Plan Note (Signed)
Continues to have intermittent episodes, needs to follow up with cardiology as directed

## 2010-11-15 NOTE — Progress Notes (Signed)
[name] [id number] 03-12-1941 11/15/2010      Progress Note-Follow Up  Subjective  Chief Complaint  Chief Complaint  Patient presents with  . Rash    around waist, under left armpit (blisters) X 3 days, painful    HPI  Patient is a 70 year old female in today for evaluation of rash. She reports a roughly 3-4 day history of some painful stinging itchy blisters on her right hip. She reports taking a bath with some baking soda and the lesions felt somewhat better but then over the last 1-2 days she's developed new lesions. This morning she said she diffuse raised urticarial, pruritic lesions over bilateral buttocks over trunk and arms under her arms. Much better now however this morning she continues to have urticarial lesions on her left axilla slightly on her abdomen and on her right hip. She finished her antibiotics for H Pylori roughly 4 days ago. She has not tried any OTC meds for this. No new products or foods. She is under a great deal of stress taking care of her ailing husband. She continues to struggle with some epigastric and chest discomfort as well as dyspepsia and anorexia despite her thyroid treatment. Initially when taken and she felt her diarrhea was improved but it is not return. Denies any bloody or tarry stool. She does have shortness of breath with exertion which is stable a recent CAT scan did confirm emphysema. She denies cough or sputum production. She denies headache or malaise. She is drinking 2 Boost daily now  Past Medical History  Diagnosis Date  . HYPERTHYROIDISM 03/11/2007  . Irritable bowel syndrome 05/19/2009  . HYPERLIPIDEMIA 03/11/2007  . ANEMIA 05/19/2009  . VITAMIN D DEFICIENCY 05/19/2009  . WEIGHT LOSS 03/11/2007  . HYPOKALEMIA 05/19/2009  . Diarrhea 05/19/2009  . ARTHRITIS, HIP 05/19/2009  . ANXIETY DEPRESSION 05/19/2009  . COLITIS 05/22/2007  . UTI 03/11/2007  . Palpitations 10/24/2010  . Lymphadenopathy 11/01/2010  . Chest pain 10/24/2010  .  Emphysema 11/01/2010  . History of chicken pox   . History of measles   . H. pylori infection 11/15/2010  . Shingles 11/15/2010  . Urticaria 11/15/2010    Past Surgical History  Procedure Date  . Abdominal hysterectomy     total  . Appendectomy     Family History  Problem Relation Age of Onset  . Alzheimer's disease Mother   . Other Father     blood clot  . Pulmonary embolism Father   . Cancer Sister 19    breast  . Emphysema Sister   . Cancer Maternal Aunt     breast  . Heart attack Paternal Grandfather   . Cancer Sister     bladder ca  . Emphysema Sister   . Cancer Sister     breast  . Emphysema Sister   . GER disease Brother   . Arthritis Sister     History   Social History  . Marital Status: Married    Spouse Name: N/A    Number of Children: N/A  . Years of Education: N/A   Occupational History  . Not on file.   Social History Main Topics  . Smoking status: Never Smoker   . Smokeless tobacco: Never Used  . Alcohol Use: No  . Drug Use: No  . Sexually Active: No   Other Topics Concern  . Not on file   Social History Narrative  . No narrative on file    Current Outpatient Prescriptions on File Prior to  Visit  Medication Sig Dispense Refill  . ALPRAZolam (XANAX) 0.25 MG tablet Take 1 tablet (0.25 mg total) by mouth 3 (three) times daily as needed. For stress  90 tablet  1  . diphenoxylate-atropine (LOMOTIL) 2.5-0.025 MG per tablet Take 1 tablet by mouth as needed. Take one or two tablets po tid       . escitalopram (LEXAPRO) 10 MG tablet Take by mouth as directed.        Marland Kitchen omeprazole (PRILOSEC) 20 MG capsule Take 1 capsule (20 mg total) by mouth 2 (two) times daily. X 10 days  20 capsule  0  . ergocalciferol (VITAMIN D2) 50000 UNITS capsule Take 50,000 Units by mouth once a week. X 12 weeks       . hyoscyamine (LEVBID) 0.375 MG 12 hr tablet Take 0.375 mg by mouth daily. To prevent diarrhea       . DISCONTD: amoxicillin (AMOXIL) 500 MG tablet Take 1  tablet (500 mg total) by mouth 2 (two) times daily. X 10 days  40 tablet  0  . DISCONTD: clarithromycin (BIAXIN) 500 MG tablet Take 1 tablet (500 mg total) by mouth 2 (two) times daily. X 10 days  10 tablet  0    No Known Allergies  Review of Systems  Review of Systems  Constitutional: Positive for weight loss and malaise/fatigue. Negative for fever.  HENT: Negative for congestion.   Eyes: Negative for discharge.  Respiratory: Positive for shortness of breath. Negative for wheezing.   Cardiovascular: Positive for chest pain. Negative for palpitations and leg swelling.  Gastrointestinal: Positive for heartburn, nausea, abdominal pain and diarrhea. Negative for constipation, blood in stool and melena.  Genitourinary: Negative for dysuria, urgency and frequency.  Musculoskeletal: Positive for joint pain. Negative for back pain and falls.       [Right hip Skin: Positive for itching and rash.  Neurological: Negative for loss of consciousness and headaches.  Endo/Heme/Allergies: Negative for polydipsia.  Psychiatric/Behavioral: Negative for depression and suicidal ideas. The patient is not nervous/anxious and does not have insomnia.     Objective  BP 138/80  Pulse 74  Temp(Src) 97.5 F (36.4 C) (Oral)  Ht 5\' 1"  (1.549 m)  Wt 94 lb 12.8 oz (43.001 kg)  BMI 17.91 kg/m2  SpO2 98%  Physical Exam  Physical Exam  Constitutional: She is oriented to person, place, and time. She appears distressed.  HENT:  Head: Normocephalic and atraumatic.  Nose: Nose normal.  Eyes: Conjunctivae are normal.  Neck: Neck supple. No thyromegaly present.  Cardiovascular: Normal rate, regular rhythm and normal heart sounds.   No murmur heard. Pulmonary/Chest: Effort normal and breath sounds normal. She has no wheezes. She has no rales. She exhibits no tenderness.  Abdominal: Soft. Bowel sounds are normal. She exhibits no distension and no mass. There is no guarding.  Musculoskeletal: She exhibits no  edema.  Lymphadenopathy:    She has no cervical adenopathy.  Neurological: She is alert and oriented to person, place, and time.  Skin: Skin is warm and dry. Rash noted. She is not diaphoretic. There is erythema.       Cluster of small vesicles with clear fluid and erythematous bas over lateral right hip. Hives, raised, white irregularly shaped uriticarial lesions on abd and in left axillae  Psychiatric: Memory, affect and judgment normal.    Lab Results  Component Value Date   TSH 1.33 10/31/2010   Lab Results  Component Value Date   WBC 6.3 10/31/2010  HGB 13.5 10/31/2010   HCT 39.8 10/31/2010   MCV 99.6 10/31/2010   PLT 112.0* 10/31/2010   Lab Results  Component Value Date   CREATININE 0.7 10/31/2010   BUN 13 10/31/2010   NA 138 10/31/2010   K 4.5 10/31/2010   CL 98 10/31/2010   CO2 31 10/31/2010   Lab Results  Component Value Date   ALT 15 05/19/2009   AST 22 05/19/2009   ALKPHOS 58 05/19/2009   BILITOT 0.9 05/19/2009   Lab Results  Component Value Date   CHOL  Value: 192        ATP III CLASSIFICATION:  <200     mg/dL   Desirable  161-096  mg/dL   Borderline High  >=045    mg/dL   High        11/05/8117   Lab Results  Component Value Date   HDL 55 10/25/2010   Lab Results  Component Value Date   LDLCALC  Value: 126        Total Cholesterol/HDL:CHD Risk Coronary Heart Disease Risk Table                     Men   Women  1/2 Average Risk   3.4   3.3  Average Risk       5.0   4.4  2 X Average Risk   9.6   7.1  3 X Average Risk  23.4   11.0        Use the calculated Patient Ratio above and the CHD Risk Table to determine the patient's CHD Risk.        ATP III CLASSIFICATION (LDL):  <100     mg/dL   Optimal  147-829  mg/dL   Near or Above                    Optimal  130-159  mg/dL   Borderline  562-130  mg/dL   High  >865     mg/dL   Very High* 7/84/6962   Lab Results  Component Value Date   TRIG 55 10/25/2010   Lab Results  Component Value Date   CHOLHDL 3.5 10/25/2010     Assessment &  Plan  H. pylori infection Finished course of Amoxicillin and Clarithromycin about 4 days ago. Continues on Omeprazole once to twice daily depending on her symptoms. Still struggling with epigastric and chest discomfort as well as some anorexia. Encouraged her to use Omeprazole daily and as needed and to get some Maalox to use prn. Should begin to improve over next 1-2 weeks. Will monitor. Avoid offending foods, eat small but frequent meals  Emphysema Confirmed by CT scan and patient struggling with some DOE, will give her some Ventolin to use prn and she is to keep track of her use and if it was helpful so we can rx further meds as indicated  COLITIS Diarrhea initially improved some with the H Pylori treatment but has worsened again, patient agrees to proceed with the stool cultures that we ordered previously and return to samples for testing. Needs to take a probiotic and fiber supplement  ARTHRITIS, HIP Right hip still exceedingly painful but patient agrees we will wait to work this up until her other health problems improve somewhat  Palpitations Patient had an appt with cardiology for tomorrow for evaluation of palpitations and atypical CP but she just cancelled it due to her rash and discomfort and will reschedule, she is  encouraged to do so as soon as she is able. Seek immediate care if symptoms worsen and do not improve  Chest pain Continues to have intermittent episodes, needs to follow up with cardiology as directed  Shingles Patient with a cluster of vesicular lesions on her right hip, these were her fist lesions, we will treat these like Zoster with a course of Acyclovir  Urticaria Patient developed diffuse wheels, itching yesterday. It was 1-2 days after the vesicular eruption on her right hip and several days after she finished her antibiotics for her H Pylori. The reaction could be an allergic reaction to her antibiotics vs a stress reaction to a shingles outbreak, we will give  a medrol dose back and start an antihistamine today. See her in follow up next week or sooner as needed  WEIGHT LOSS Patient weight is stable but not improved. She has started Boost bid and is encouraged to continue this while we treat her other conditions

## 2010-11-15 NOTE — Assessment & Plan Note (Signed)
Patient developed diffuse wheels, itching yesterday. It was 1-2 days after the vesicular eruption on her right hip and several days after she finished her antibiotics for her H Pylori. The reaction could be an allergic reaction to her antibiotics vs a stress reaction to a shingles outbreak, we will give a medrol dose back and start an antihistamine today. See her in follow up next week or sooner as needed

## 2010-11-15 NOTE — Patient Instructions (Signed)
Hives Hives (urticaria) are itchy, red, swollen patches on the skin. They may change size, shape, and location quickly and repeatedly. Hives that occur deeper in the skin can cause swelling of the hands, feet, and face. Hives may be an allergic reaction to something you ate, touched, or put on your skin. Hives can also be a reaction to cold, heat, viral infections, medication, insect bites, or emotional stress. Often the cause is hard to find. Hives can come and go for several days to several weeks. Hives are not contagious. HOME CARE INSTRUCTIONS  If the cause of the hives is known, avoid exposure to that source.   To relieve itching and rash:   Apply cold compresses to the skin or take cool water baths. Do not take hot baths or showers because the warmth will make the itching worse.   The best medicine for hives is an antihistamine. An antihistamine will not cure hives, but it will reduce their severity. You can use an antihistamine available over the counter, such as Benadryl. This medicine may make you sleepy. You should not drive while using this medicine.   Take or give Benadryl as directed by your pharmacist or caregiver.   Other medications may be prescribed for itching. Use these as directed.   You should wear loose fitting clothing, including undergarments, as skin irritations may make hives worse.   Follow-up as directed by your caregiver.  SEEK MEDICAL CARE IF:  You still have considerable itching after taking the medication (prescribed or purchased over the counter).   An oral temperature above 170f develops.   Joint swelling or pain occurs.   You are not improving or are getting worse.  SEEK IMMEDIATE MEDICAL CARE IF:  You notice any swelling of the lips, tongue, face or neck.   You have shortness of breath, difficulty breathing or swallowing, or tightness in the throat or chest.   Belly (abdominal) pain develops.   You are feeling very sick.   Your hives  continue to spread.  These may be the first signs of a life-threatening allergic reaction. THIS IS AN EMERGENCY. Call 911 for medical help. Document Released: 08/23/2004 Document Re-Released: 10/10/2009 [facility] Patient Information 2011 [place], [place].Shingles (Herpes Zoster) Shingles is caused by the same virus that causes chicken pox (varicella zoster virus or VZV). Shingles often occurs many years or decades after having chicken pox. That is why it is more common in adults older than 50 years. The virus reactivates and breaks out as an infection in a nerve root. SYMPTOMS  The initial feeling (sensations) may be pain. This pain is usually described as:   Burning.   Stabbing.   Throbbing.   Tingling in the nerve root.   A red rash will follow in a couple days. The rash may occur in any area of the body and is usually on one side (unilateral) of the body in a band or belt-like pattern. The rash usually starts out as very small blisters (vesicles). They will dry up after 7 to 10 days. This is not usually a significant problem except for the pain it causes.   Long lasting (chronic) pain is more likely in an elderly person. It can last months to years. This condition is called post-herpetic neuralgia.  Shingles can be an extremely severe infection in someone with AIDS, a weakened immune system or with forms of leukemia. It can also be severe if you are taking transplant medications or other medications that weaken the immune system. TREATMENT  Your caregiver will often treat you with:  Antiviral drugs.   Anti-inflammatory drugs.   Pain medications.   Bed rest is very important in preventing the pain associated with herpes zoster (post-herpetic neuralgia).   Application of heat in the form of a hot-water bottle or electric heating pad or gentle pressure with the hand is recommended to help with the pain or discomfort.  PREVENTION A varicella zoster vaccine is available to help protect  against the virus. The Food and Drug Administration approved the varicella zoster vaccine for individuals 72 years of age and older. HOME CARE INSTRUCTIONS  Cool compresses to the area of rash may be helpful.   Only take over-the-counter or prescription medicines for pain, discomfort or fever as directed by your caregiver.   Avoid contact with:   Babies.   Pregnant women.   Children with eczema.   Elderly people with transplants.   People with chronic illnesses, such as leukemia and AIDS.   If the area involved is on your face, you may receive a referral for follow-up to a specialist. It is very important to keep all follow-up appointments. This will help avoid eye complications, chronic pain or disability.  SEEK IMMEDIATE MEDICAL CARE IF:  You develop any pain (headache) in the area of the face or eye. This must be followed carefully by your caregiver or ophthalmologist. An infection in part of your eye (cornea) can be very serious. It could lead to blindness.   You do not have pain relief from prescribed medications.   The redness or swelling spreads.   The area involved becomes very swollen and painful.   You have an oral temperature above 102 F (38 C), not controlled by medicine.   You notice any red or painful lines extending away from the affected area toward your heart (lymphangitis).   Your condition is worsening or has changed.  Document Released: 07/16/2005 Document Re-Released: 01/03/2010 [facility] Patient Information 2011 [place], [place].

## 2010-11-15 NOTE — Assessment & Plan Note (Signed)
Finished course of Amoxicillin and Clarithromycin about 4 days ago. Continues on Omeprazole once to twice daily depending on her symptoms. Still struggling with epigastric and chest discomfort as well as some anorexia. Encouraged her to use Omeprazole daily and as needed and to get some Maalox to use prn. Should begin to improve over next 1-2 weeks. Will monitor. Avoid offending foods, eat small but frequent meals

## 2010-11-15 NOTE — Assessment & Plan Note (Signed)
Confirmed by CT scan and patient struggling with some DOE, will give her some Ventolin to use prn and she is to keep track of her use and if it was helpful so we can rx further meds as indicated

## 2010-11-15 NOTE — Assessment & Plan Note (Addendum)
Diarrhea initially improved some with the H Pylori treatment but has worsened again, patient agrees to proceed with the stool cultures that we ordered previously and return to samples for testing. Needs to take a probiotic and fiber supplement

## 2010-11-15 NOTE — Assessment & Plan Note (Signed)
Patient weight is stable but not improved. She has started Boost bid and is encouraged to continue this while we treat her other conditions

## 2010-11-15 NOTE — Assessment & Plan Note (Signed)
Right hip still exceedingly painful but patient agrees we will wait to work this up until her other health problems improve somewhat

## 2010-11-15 NOTE — Assessment & Plan Note (Signed)
Patient had an appt with cardiology for tomorrow for evaluation of palpitations and atypical CP but she just cancelled it due to her rash and discomfort and will reschedule, she is encouraged to do so as soon as she is able. Seek immediate care if symptoms worsen and do not improve

## 2010-11-16 ENCOUNTER — Encounter: Payer: Medicare Other | Admitting: Physician Assistant

## 2010-11-21 ENCOUNTER — Telehealth: Payer: Self-pay | Admitting: Family Medicine

## 2010-11-21 NOTE — Telephone Encounter (Signed)
FYI: From pts daughter- pt would like to know how much of her mothers issue could be anxiety/ bipolar? Pts daughter states that in the last couple of years since pts spouse health. Pts daughter states pt cries all the time. Pt has told daughter recently that she doesn't want to live anymore. Pt doesn't believe in mental health.

## 2010-11-21 NOTE — Telephone Encounter (Signed)
Wants to share some info about her mother

## 2010-11-23 ENCOUNTER — Encounter: Payer: Self-pay | Admitting: Family Medicine

## 2010-11-23 ENCOUNTER — Ambulatory Visit (INDEPENDENT_AMBULATORY_CARE_PROVIDER_SITE_OTHER): Payer: Medicare Other | Admitting: Family Medicine

## 2010-11-23 DIAGNOSIS — R591 Generalized enlarged lymph nodes: Secondary | ICD-10-CM

## 2010-11-23 DIAGNOSIS — R599 Enlarged lymph nodes, unspecified: Secondary | ICD-10-CM

## 2010-11-23 DIAGNOSIS — E559 Vitamin D deficiency, unspecified: Secondary | ICD-10-CM

## 2010-11-23 DIAGNOSIS — M1610 Unilateral primary osteoarthritis, unspecified hip: Secondary | ICD-10-CM

## 2010-11-23 DIAGNOSIS — R002 Palpitations: Secondary | ICD-10-CM

## 2010-11-23 DIAGNOSIS — E0590 Thyrotoxicosis, unspecified without thyrotoxic crisis or storm: Secondary | ICD-10-CM

## 2010-11-23 DIAGNOSIS — D649 Anemia, unspecified: Secondary | ICD-10-CM

## 2010-11-23 DIAGNOSIS — A048 Other specified bacterial intestinal infections: Secondary | ICD-10-CM

## 2010-11-23 DIAGNOSIS — K589 Irritable bowel syndrome without diarrhea: Secondary | ICD-10-CM

## 2010-11-23 DIAGNOSIS — R197 Diarrhea, unspecified: Secondary | ICD-10-CM

## 2010-11-23 DIAGNOSIS — B029 Zoster without complications: Secondary | ICD-10-CM

## 2010-11-23 DIAGNOSIS — M25559 Pain in unspecified hip: Secondary | ICD-10-CM

## 2010-11-23 DIAGNOSIS — R634 Abnormal weight loss: Secondary | ICD-10-CM

## 2010-11-23 DIAGNOSIS — M25551 Pain in right hip: Secondary | ICD-10-CM

## 2010-11-23 DIAGNOSIS — E876 Hypokalemia: Secondary | ICD-10-CM

## 2010-11-23 DIAGNOSIS — L509 Urticaria, unspecified: Secondary | ICD-10-CM

## 2010-11-23 DIAGNOSIS — F341 Dysthymic disorder: Secondary | ICD-10-CM

## 2010-11-23 DIAGNOSIS — F32A Depression, unspecified: Secondary | ICD-10-CM

## 2010-11-23 DIAGNOSIS — F329 Major depressive disorder, single episode, unspecified: Secondary | ICD-10-CM

## 2010-11-23 DIAGNOSIS — B0229 Other postherpetic nervous system involvement: Secondary | ICD-10-CM | POA: Insufficient documentation

## 2010-11-23 HISTORY — DX: Other postherpetic nervous system involvement: B02.29

## 2010-11-23 MED ORDER — LACTINEX PO CHEW: 2.0000 | CHEWABLE_TABLET | Freq: Three times a day (TID) | ORAL | Status: DC

## 2010-11-23 MED ORDER — LIDOCAINE 5 % EX PTCH: MEDICATED_PATCH | CUTANEOUS | Status: DC

## 2010-11-23 MED ORDER — VITAMIN D 1000 UNITS PO CAPS: 1000.0000 [IU] | ORAL_CAPSULE | Freq: Every day | ORAL | Status: DC

## 2010-11-23 MED ORDER — CITALOPRAM HYDROBROMIDE 10 MG PO TABS
10.0000 mg | ORAL_TABLET | Freq: Every day | ORAL | Status: DC
Start: 2010-11-23 — End: 2011-09-20

## 2010-11-23 MED ORDER — LIDOCAINE 5 % EX PTCH: MEDICATED_PATCH | CUTANEOUS | Status: AC

## 2010-11-23 MED ORDER — LIDOCAINE 5 % EX PTCH: 1.0000 | MEDICATED_PATCH | Freq: Two times a day (BID) | CUTANEOUS | Status: DC

## 2010-11-23 NOTE — Assessment & Plan Note (Signed)
Resolved

## 2010-11-23 NOTE — Assessment & Plan Note (Signed)
Rash has completely resolved but pain is persistent.

## 2010-11-23 NOTE — Assessment & Plan Note (Signed)
Resolved, no more rash or itching s/p steroids

## 2010-11-23 NOTE — Assessment & Plan Note (Signed)
Occuring but only lasting seconds without associated symptoms and less frequently. Patient advised that if they begin to last longer and especially if she has associated symptoms she needs to seek immediate care. RRR today

## 2010-11-23 NOTE — Assessment & Plan Note (Signed)
Recent CBC showed this had resolved, will continue to monitor

## 2010-11-23 NOTE — Assessment & Plan Note (Signed)
Recent labs within normal limits.

## 2010-11-23 NOTE — Assessment & Plan Note (Signed)
Has appt for further breast and LN imaging next month for surveillance. Encouraged to keep that appt. No new lesions

## 2010-11-23 NOTE — Assessment & Plan Note (Signed)
Patient agrees to start low dose Citalopram 10mg  daily to see if this is helpful

## 2010-11-23 NOTE — Assessment & Plan Note (Signed)
Patient continues to have diarrhea and abdominal cramping with anorexia. She has been struggling with all of these symptoms for over a year and is under a great deal of stress taking care of her severely debilitated husband but she does note the diarrhea has gotten worse again. She denies any bloody or tarry stool but since taking the Biaxin and Amox her diarrhea has worsened after intitially improving. Her CP has improved and has not occurred since 4/15. She has stool culture cups in her possession and she is reminded we cannot r/o C Diff til she brings them back which she promises to do promptly this time. For the stress component of what she is going through she agrees to start low dose Citalopram. She has tolerated Lexapro in the past but only took it for 2 weeks til her samples ran out. Call with any concerns

## 2010-11-23 NOTE — Assessment & Plan Note (Signed)
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 Encouraged her to use a Quad cane regularly until seen by ortho

## 2010-11-23 NOTE — Patient Instructions (Signed)
Postherpetic Neuralgia Shingles is a painful disease. It is caused by the herpes zoster virus. This is the same virus which also causes chicken pox. It can affect the torso, limbs, or the face. For most people, shingles is a condition of rather sudden onset. Pain usually lasts about one month. In older patients, or patients with poor immune systems, a painful, longstanding (chronic) condition called postherpetic neuralgia can develop. This condition rarely happens before age 107. But at least half of people over fifty become affected following an attack of shingles. There is a natural tendency for this condition to improve over time with no treatment. Less than five percent of patients have pain that lasts for more than 1 year. DIAGNOSIS Herpes is usually easily diagnosed on physical exam. Pain sometimes follows when the skin sores (lesions) have disappeared. It is called postherpetic neuralgia. That name simply means the pain that follows herpes. TREATMENT   Treating this condition may be difficult. Usually one of the tricyclic anti-depressants, often amitriptyline, is the first line of treatment. There is evidence that the sooner these medications are given, the more likely they are to reduce pain.   Conventional analgesics, regional nerve blocks, and anticonvulsants have little benefit in most cases when used alone. Other tricyclic anti-depressants are used as a second option if the first antidepressant is unsuccessful.   Anticonvulsants, including carbamazepine, have been found to provide some added benefit when used with a tricyclic anti-depressant. This is especially for the stabbing type of pain similar to that of trigeminal neuralgia.   Chronic opioid therapy. This is a strong narcotic pain medication. It is used to treat pain that is resistant to other measures. The issues of dependency and tolerance can be reduced with closely managed care.   Some cream treatments are applied locally to the  affected area. They can help when used with other treatments. Their use may be difficult in the case of postherpetic trigeminal neuralgia. This is involved with the face. So the substances can irritate the eye and the skin around the eye. Examples of creams used include Capsaicin and lidocaine creams.   For shingles, antiviral therapies along with analgesics are recommended. Studies of the effect of anti-viral agents such as acyclovir on shingles have been done. They show improved rates of healing and decreased severity of sudden (acute) pain. Some observations suggest that nerve blocks during shingles infection will:   Reduce pain.   Shorten the acute episode.   Prevent the emergence of postherpetic neuralgia.  Viral medications used include Acyclovir (Zovirax), Valacyclovir, Famciclovir and a lysine diet. Document Released: 10/06/2002 Document Re-Released: 10/12/2008 [facility] Patient Information 2011 [place], [place].  Use up to 2 Lidoderm patches daily to affected area on for 12 hours and off for 12 hours  Walk with a [employer] for stability

## 2010-11-23 NOTE — Assessment & Plan Note (Signed)
Has finished 50000iu ned, start Vitamin D 1000iu daily

## 2010-11-23 NOTE — Assessment & Plan Note (Signed)
Active lesions all resolved but she has persistent pain in the area where the major lesions were. Has developed postherpetic neuralgia, will rx Lidoderm and given samples, consider Gabapentin if no improvement

## 2010-11-23 NOTE — Assessment & Plan Note (Signed)
Continues to loose weight and has no appetite. Multifactorial but concerned that depression is a major factor, daughter is with her and agrees. Patient agrees to start Citalopram 10 mg qd. Continue with Ensure or consider Carn[employer] Instant Breakfast bid to help keep her weight up while we continue to evaluate. Recent CT scanning was unremarkable. Has appt with GI on 11/28/10. Is encouraged to keep the appt

## 2010-11-23 NOTE — Progress Notes (Signed)
[name] [id number] 08/31/1940 11/23/2010      Progress Note-Follow Up  Subjective  Chief Complaint  Chief Complaint  Patient presents with  . Follow-up    3 week follow up, anxiety?    HPI  Patient is a 12 no Caucasian female with multiple concerns. Her urticaria and shingles from the last visit have resolved no further rash or itching. Unfortunately she has been left with significant pain in her right anterior hip right where her vesicular and worst rash was present. A burning intense pain occurs she also is a very bad hip in that region has had hip pain for quite some time. Is to the point now which she's had some near falls secondary to her intense pain and is ready to see an orthopedist for further evaluation. She reports her chest pains seem to have resolved has not had one in over 10 days just Pepsi is better as well. She does have persistent anorexia and weight loss. Was using some boost but has run out. Continues to have diarrhea several loose stool every day but denies any bloody or tarry stool. Has yet to bring back the stool cultures we requested. She denies shortness of breath does keep her albuterol with her for her new diagnosis of emphysema but has had no flares. No fevers no chills. No headaches, congestion, shortness of breath but she is having intermittent palpitations lasting only seconds without associated symptoms on an infrequent basis had one episode yesterday. Is not taking Lomotil, Levaquin, Claritin and she did not find helpful has finished her vitamin D. She is taking her omeprazole and keeps albuterol with her.  Past Medical History  Diagnosis Date  . HYPERTHYROIDISM 03/11/2007  . Irritable bowel syndrome 05/19/2009  . HYPERLIPIDEMIA 03/11/2007  . ANEMIA 05/19/2009  . VITAMIN D DEFICIENCY 05/19/2009  . WEIGHT LOSS 03/11/2007  . HYPOKALEMIA 05/19/2009  . Diarrhea 05/19/2009  . ARTHRITIS, HIP 05/19/2009  . ANXIETY DEPRESSION 05/19/2009  . COLITIS 05/22/2007    . UTI 03/11/2007  . Palpitations 10/24/2010  . Lymphadenopathy 11/01/2010  . Chest pain 10/24/2010  . Emphysema 11/01/2010  . History of chicken pox   . History of measles   . H. pylori infection 11/15/2010  . Shingles 11/15/2010  . Urticaria 11/15/2010    Past Surgical History  Procedure Date  . Abdominal hysterectomy     total  . Appendectomy     Family History  Problem Relation Age of Onset  . Alzheimer's disease Mother   . Other Father     blood clot  . Pulmonary embolism Father   . Cancer Sister 58    breast  . Emphysema Sister   . Cancer Maternal Aunt     breast  . Heart attack Paternal Grandfather   . Cancer Sister     bladder ca  . Emphysema Sister   . Cancer Sister     breast  . Emphysema Sister   . GER disease Brother   . Arthritis Sister     History   Social History  . Marital Status: Married    Spouse Name: N/A    Number of Children: N/A  . Years of Education: N/A   Occupational History  . Not on file.   Social History Main Topics  . Smoking status: Never Smoker   . Smokeless tobacco: Never Used  . Alcohol Use: No  . Drug Use: No  . Sexually Active: No   Other Topics Concern  . Not on file  Social History Narrative  . No narrative on file    Current Outpatient Prescriptions on File Prior to Visit  Medication Sig Dispense Refill  . albuterol (PROVENTIL,VENTOLIN) 90 MCG/ACT inhaler Inhale 2 puffs into the lungs every 6 (six) hours as needed for wheezing.  17 g  12  . ALPRAZolam (XANAX) 0.25 MG tablet Take 1 tablet (0.25 mg total) by mouth 3 (three) times daily as needed. For stress  90 tablet  1  . loratadine (CLARITIN) 10 MG tablet Take 1 tablet (10 mg total) by mouth daily.  30 tablet  11  . omeprazole (PRILOSEC) 20 MG capsule Take 1 capsule (20 mg total) by mouth 2 (two) times daily. X 10 days  20 capsule  0  . methylPREDNISolone (MEDROL DOSEPACK) 4 MG tablet follow package directions  21 tablet  0  . DISCONTD: acyclovir (ZOVIRAX) 400 MG  tablet Take 400 mg by mouth 5 (five) times daily. X 7 days       . DISCONTD: amoxicillin (AMOXIL) 500 MG capsule       . DISCONTD: clarithromycin (BIAXIN) 500 MG tablet       . DISCONTD: diphenoxylate-atropine (LOMOTIL) 2.5-0.025 MG per tablet Take 1 tablet by mouth as needed. Take one or two tablets po tid       . DISCONTD: ergocalciferol (VITAMIN D2) 50000 UNITS capsule Take 50,000 Units by mouth once a week. X 12 weeks       . DISCONTD: escitalopram (LEXAPRO) 10 MG tablet Take by mouth as directed.        Marland Kitchen DISCONTD: hyoscyamine (LEVBID) 0.375 MG 12 hr tablet Take 0.375 mg by mouth daily. To prevent diarrhea         No Known Allergies  Review of Systems  Review of Systems  Constitutional: Positive for weight loss and malaise/fatigue. Negative for fever, chills and diaphoresis.  HENT: Negative for ear pain, congestion and sore throat.   Eyes: Negative for discharge.  Respiratory: Negative for cough and shortness of breath.   Cardiovascular: Positive for palpitations. Negative for chest pain and leg swelling.  Gastrointestinal: Positive for abdominal pain and diarrhea. Negative for heartburn, nausea, vomiting, constipation, blood in stool and melena.  Genitourinary: Negative for dysuria, urgency and frequency.  Musculoskeletal: Positive for joint pain. Negative for falls.       [Right hip pain anteriorly is the worst Skin: Negative for itching and rash.  Neurological: Negative for focal weakness, loss of consciousness and headaches.  Endo/Heme/Allergies: Negative for polydipsia.  Psychiatric/Behavioral: Negative for depression and suicidal ideas. The patient is not nervous/anxious and does not have insomnia.     Objective  BP 109/68  Pulse 81  Temp(Src) 97.8 F (36.6 C) (Oral)  Ht 5\' 1"  (1.549 m)  Wt 92 lb 6.4 oz (41.912 kg)  BMI 17.46 kg/m2  SpO2 98%  Physical Exam  Physical Exam  Constitutional: She is oriented to person, place, and time. No distress.       Thin, pale    HENT:  Head: Normocephalic and atraumatic.  Eyes: Conjunctivae are normal.  Neck: Neck supple. No thyromegaly present.  Cardiovascular: Normal rate, regular rhythm and normal heart sounds.   No murmur heard. Pulmonary/Chest: Effort normal and breath sounds normal. She has no wheezes.  Abdominal: She exhibits no distension and no mass.  Musculoskeletal: She exhibits no edema.  Lymphadenopathy:    She has no cervical adenopathy.  Neurological: She is alert and oriented to person, place, and time.  Skin: Skin is warm  and dry. No rash noted. She is not diaphoretic.  Psychiatric: Memory, affect and judgment normal.    Lab Results  Component Value Date   TSH 1.33 10/31/2010   Lab Results  Component Value Date   WBC 6.3 10/31/2010   HGB 13.5 10/31/2010   HCT 39.8 10/31/2010   MCV 99.6 10/31/2010   PLT 112.0* 10/31/2010   Lab Results  Component Value Date   CREATININE 0.7 10/31/2010   BUN 13 10/31/2010   NA 138 10/31/2010   K 4.5 10/31/2010   CL 98 10/31/2010   CO2 31 10/31/2010   Lab Results  Component Value Date   ALT 15 05/19/2009   AST 22 05/19/2009   ALKPHOS 58 05/19/2009   BILITOT 0.9 05/19/2009   Lab Results  Component Value Date   CHOL  Value: 192        ATP III CLASSIFICATION:  <200     mg/dL   Desirable  119-147  mg/dL   Borderline High  >=829    mg/dL   High        5/62/1308   Lab Results  Component Value Date   HDL 55 10/25/2010   Lab Results  Component Value Date   LDLCALC  Value: 126        Total Cholesterol/HDL:CHD Risk Coronary Heart Disease Risk Table                     Men   Women  1/2 Average Risk   3.4   3.3  Average Risk       5.0   4.4  2 X Average Risk   9.6   7.1  3 X Average Risk  23.4   11.0        Use the calculated Patient Ratio above and the CHD Risk Table to determine the patient's CHD Risk.        ATP III CLASSIFICATION (LDL):  <100     mg/dL   Optimal  657-846  mg/dL   Near or Above                    Optimal  130-159  mg/dL   Borderline  962-952  mg/dL   High   >841     mg/dL   Very High* 10/20/4008   Lab Results  Component Value Date   TRIG 55 10/25/2010   Lab Results  Component Value Date   CHOLHDL 3.5 10/25/2010     Assessment & Plan  Irritable bowel syndrome Patient continues to have diarrhea and abdominal cramping with anorexia. She has been struggling with all of these symptoms for over a year and is under a great deal of stress taking care of her severely debilitated husband but she does note the diarrhea has gotten worse again. She denies any bloody or tarry stool but since taking the Biaxin and Amox her diarrhea has worsened after intitially improving. Her CP has improved and has not occurred since 4/15. She has stool culture cups in her possession and she is reminded we cannot r/o C Diff til she brings them back which she promises to do promptly this time. For the stress component of what she is going through she agrees to start low dose Citalopram. She has tolerated Lexapro in the past but only took it for 2 weeks til her samples ran out. Call with any concerns  HYPERTHYROIDISM Recent labs within normal limits  ANEMIA Recent CBC showed this had resolved,  will continue to monitor  WEIGHT LOSS Continues to loose weight and has no appetite. Multifactorial but concerned that depression is a major factor, daughter is with her and agrees. Patient agrees to start Citalopram 10 mg qd. Continue with Ensure or consider Carnation Instant Breakfast bid to help keep her weight up while we continue to evaluate. Recent CT scanning was unremarkable. Has appt with GI on 11/28/10. Is encouraged to keep the appt  VITAMIN D DEFICIENCY Has finished 50000iu ned, start Vitamin D 1000iu daily  Palpitations Occuring but only lasting seconds without associated symptoms and less frequently. Patient advised that if they begin to last longer and especially if she has associated symptoms she needs to seek immediate care. RRR today  Shingles Active lesions all  resolved but she has persistent pain in the area where the major lesions were. Has developed postherpetic neuralgia, will rx Lidoderm and given samples, consider Gabapentin if no improvement  Lymphadenopathy Has appt for further breast and LN imaging next month for surveillance. Encouraged to keep that appt. No new lesions  HYPOKALEMIA Resolved  H. pylori infection Dyspepsia nd CP greatly improved, cont Omeprazole and will check for resolution with breath test if symptoms do not continue to improve  ARTHRITIS, HIP With worsening pain and debility complicated by postherpetic neuralgia, given Lidoderm patches and referred to Orthopaedics to explore their options due to her worsening pain. Encouraged her to use a Quad cane regularly until seen by ortho  ANXIETY DEPRESSION Patient agrees to start low dose Citalopram 10mg  daily to see if this is helpful  Postherpetic neuralgia Rash has completely resolved but pain is persistent.  Urticaria Resolved, no more rash or itching s/p steroids

## 2010-11-23 NOTE — Assessment & Plan Note (Signed)
Dyspepsia nd CP greatly improved, cont Omeprazole and will check for resolution with breath test if symptoms do not continue to improve

## 2010-11-24 NOTE — H&P (Signed)
[name], [name]                ACCOUNT NO.:  [id number]  MEDICAL RECORD NO.:  [id number]           PATIENT TYPE:  I  LOCATION:  2029                         FACILITY:  [facility]  PHYSICIAN:  Rollene Rotunda, MD, FACCDATE OF BIRTH:  July 31, 1940  DATE OF ADMISSION:  10/24/2010 DATE OF DISCHARGE:                             HISTORY & PHYSICAL   PRIMARY CARE PHYSICIAN:  Dr. Abner Greenspan, at [facility].  PRIMARY CARDIOLOGIST:  None.  CHIEF COMPLAINT:  Chest pain.  HISTORY OF PRESENT ILLNESS:  [name] is a 70 year old female with no history of coronary artery disease.  She has a history of episodic chest pain that has been exertional for the last few months.  It reaches a 5- 6/10.  She gets a clammy feeling with it.  She does not get radiation of the pain.  The pain itself does not specifically make her short of breath, but she gets shortness of breath with exertion prior to the onset of the chest pain and the shortness of breath will last for significant period of time after the chest pain resolves.  There is no nausea or vomiting.  The chest pain will last generally an hour or so and be relieved with rest.  Her last episode of chest pain prior to yesterday was about a week ago.  Yesterday, she felt short of breath upon waking up.  She limited her activity because she felt short of breath and did not generally feel well.  Last p.m. after a very light supper, she had onset of her usual chest pain.  She describes it as a tightness and then aching.  There is no change with position or deep inspiration.  The symptoms began approximately 10:30 p.m.  After a couple of hours, she took an aspirin 81 mg.  Her symptoms resolved after a total duration of greater than 5 hours.  Today, she her primary care physician saw her.  She was noted to have an irregular heart rate and then transport by EMS to the emergency room was recommended.  Since being in the emergency room, she has had recurrent  mild chest pain still at rest.  These were the first episode of chest pain that she has ever had which started at rest.  PAST MEDICAL HISTORY: 1. Irritable bowel syndrome. 2. Osteoarthritis. 3. She has not had a recent physical with blood work, but denies any     history of diabetes, hypertension, or hyperlipidemia.  PAST SURGICAL HISTORY:  She is status post hysterectomy, appendectomy.  ALLERGIES:  She does not tolerate TYLENOL because it gives her palpitations.  MEDICATIONS:  She takes no prescription meds.  She takes over-the- counter Advil 1-2 tablets a day p.r.n.  SOCIAL HISTORY:  She lives in [place] with her husband.  She is retired from [employer].  She has never smoked and has no history of alcohol or drug abuse.  FAMILY HISTORY:  Mother died at age 23 and her father died at 39, neither one with coronary artery disease and her siblings have coronary artery disease.  REVIEW OF SYSTEMS:  She has chronic arthralgias and multiple  joint pains.  She denies reflux symptoms or melena and has not had any recent irritable bowel symptoms.  Her appetite is poor.  She has had a gradual decline in her weight without dieting and actually saw Dr. Scotty Court, for this in 2010, at which time her weight was 98 pounds.  She has not had fevers or chills recently.  Full 14-point review of systems is otherwise negative except as stated in the HPI.  PHYSICAL EXAMINATION:  VITAL SIGNS:  Temperature is 98.2, blood pressure 123/71, pulse 67, respiratory rate 16, O2 saturation 98% on room air. GENERAL:  She is a well developed, slender white female in mild distress. HEENT:  Normal. NECK:  There is no lymphadenopathy, thyromegaly, bruit, or JVD noted. CV:  Her heart is regular in rate and rhythm with an S1 and S2 and no significant murmur, rub, or gallop is noted. LUNGS:  Clear to auscultation bilaterally. SKIN:  No rashes or lesions are noted. ABDOMEN:  Soft, nontender with active  bowel sounds. EXTREMITIES:  Distal pulses are intact in all four extremities.  There is no cyanosis, clubbing, or edema noted. MUSCULOSKELETAL:  There is no joint deformity or effusions and no spine or CVA tenderness. NEUROLOGIC:  She is alert and oriented.  Cranial nerves II-XII grossly intact.  Chest x-ray shows emphysematous changes with no acute disease.  EKG is sinus rhythm/sinus bradycardia rate 57 with no acute ischemic changes.  EKGs are reviewed from her primary care physician and from EMS and no clear atrial fibrillation is documented, although the EKG at the doctor's office was read as atrial flutter and artifact makes it difficult to interpret.  LABORATORY VALUES:  Hemoglobin 13.2, hematocrit 38.5, WBC is 4.6, platelets 193.  Sodium 137, potassium 4.2, chloride 103, CO2 of 29, BUN 7, creatinine 0.65, glucose 100.  Point care is negative x2.  IMPRESSION:  [name] was seen today by Dr. Antoine Poche, the patient evaluated and the data reviewed.  She has a history of exertional chest pain that has now progressed to resting chest pain.  She has substernal soreness.  The pain has some typical features.  There is not yet any objective evidence of ischemia.  PLAN:  Chest pain.  Her chest pain is very concerning for unstable anginal pain.  She will be placed on heparin, we will add aspirin and low-dose beta-blocker, as well as IV nitroglycerin for recurrent pain. Cardiac catheterization is planned for a.m. and she has already been placed on the board.     Theodore Demark, PA-C   ______________________________ Rollene Rotunda, MD, [facility]   RB/MEDQ  D:  10/24/2010  T:  10/25/2010  Job:  [id number]  Electronically Signed by Theodore Demark PA-C on 11/01/2010 01:12:51 PM Electronically Signed by Rollene Rotunda MD [facility] on 11/24/2010 11:44:35 AM

## 2010-11-28 ENCOUNTER — Ambulatory Visit (INDEPENDENT_AMBULATORY_CARE_PROVIDER_SITE_OTHER): Payer: Medicare Other | Admitting: Internal Medicine

## 2010-11-28 ENCOUNTER — Encounter: Payer: Self-pay | Admitting: Internal Medicine

## 2010-11-28 VITALS — BP 108/68 | HR 64 | Ht 62.0 in | Wt 92.8 lb

## 2010-11-28 DIAGNOSIS — R197 Diarrhea, unspecified: Secondary | ICD-10-CM

## 2010-11-28 DIAGNOSIS — K589 Irritable bowel syndrome without diarrhea: Secondary | ICD-10-CM

## 2010-11-28 DIAGNOSIS — R634 Abnormal weight loss: Secondary | ICD-10-CM

## 2010-11-28 DIAGNOSIS — R079 Chest pain, unspecified: Secondary | ICD-10-CM

## 2010-11-28 MED ORDER — DICYCLOMINE HCL 10 MG PO CAPS: 10.0000 mg | ORAL_CAPSULE | Freq: Two times a day (BID) | ORAL | Status: DC | PRN

## 2010-11-28 NOTE — Patient Instructions (Signed)
Cc. [name] We will send in a new prescription to your pharmacy Please schedule a office appointment for 3 months

## 2010-11-28 NOTE — Progress Notes (Signed)
HISTORY OF PRESENT ILLNESS:  [name] is a 70 y.o. female with multiple medical problems as listed below. I saw the patient in the hospital recently for noncardiac chest pain. She underwent an upper endoscopy on 10/26/2010. This was normal without an obvious cause for pain found. She was empirically placed on omeprazole 20 mg daily. She also complained of problems with chronic diarrhea. Duodenal biopsies were taken and returned negative. She also underwent an abdominal ultrasound on 11/06/2010. This was normal. She presents today for followup. She is accompanied by her husband. Patient has had problems with postprandial diarrhea for years. She actually underwent GI workup with Dr. Vida Rigger. Complete colonoscopy with colonic and ileo-biopsies were normal. His also had a 25 pound weight loss over the past 4 years. 10 pounds over the past year. Her appetite is poor. Her primary provider recently started therapy for anxiety/depression. Patient also suffered recently from right-sided shingles. She does feel her chest pain is better since initiating PPI therapy. She has no other GI complaints. Laboratories from 10/31/2010 revealed normal C. reactive protein, thyroid stimulating hormone, CBC, comprehensive metabolic panel, and erythrocyte sedimentation rate. H. pylori antibody was positive. She was apparently treated.  REVIEW OF SYSTEMS:  All non-GI ROS negative except for hip pain, fatigue, leg cramps, insomnia.  Past Medical History  Diagnosis Date  . HYPERTHYROIDISM 03/11/2007  . Irritable bowel syndrome 05/19/2009  . HYPERLIPIDEMIA 03/11/2007  . ANEMIA 05/19/2009  . VITAMIN D DEFICIENCY 05/19/2009  . WEIGHT LOSS 03/11/2007  . HYPOKALEMIA 05/19/2009  . Diarrhea 05/19/2009  . ARTHRITIS, HIP 05/19/2009  . ANXIETY DEPRESSION 05/19/2009  . COLITIS 05/22/2007  . UTI 03/11/2007  . Palpitations 10/24/2010  . Lymphadenopathy 11/01/2010  . Chest pain 10/24/2010  . Emphysema 11/01/2010  . History of chicken  pox   . History of measles   . H. pylori infection 11/15/2010  . Shingles 11/15/2010  . Urticaria 11/15/2010  . Postherpetic neuralgia 11/23/2010    Past Surgical History  Procedure Date  . Abdominal hysterectomy     total  . Appendectomy     Social History Nelani Schmelzle  reports that she has never smoked. She has never used smokeless tobacco. She reports that she does not drink alcohol or use illicit drugs.  family history includes Alzheimer's disease in her mother; Arthritis in her sister; Cancer in her maternal aunt and sisters; Cancer (age of onset:80) in her sister; Emphysema in her sisters; GER disease in her brother; Heart attack in her paternal grandfather; Other in her father; and Pulmonary embolism in her father.  No Known Allergies     PHYSICAL EXAMINATION:  Vital signs: BP 108/68  Pulse 64  Ht 5\' 2"  (1.575 m)  Wt 92 lb 12.8 oz (42.094 kg)  BMI 16.97 kg/m2 General: Thin elderly female, no acute distress HEENT: Sclerae are anicteric, conjunctiva pink. Oral mucosa intact Lungs: Clear Heart: Regular Abdomen: soft, nontender, nondistended, no obvious ascites, no peritoneal signs, normal bowel sounds. No organomegaly. Extremities: No edema Psychiatric: alert and oriented x3. Cooperative    ASSESSMENT: #1. Recent problems with chest pain. Negative cardiac workup and normal EGD. Empirically placed on PPI. Chest pain resolved. Unclear if this is coincident or cause and effect. #2. Problems with chronic diarrhea. Likely irritable bowel syndrome #3. Chronic progressive weight loss. Possibly related to combination of medical problems and issues with anxiety/depression. No evidence of other problems through extensive workup to date  PLAN: #1. Continue empiric PPI for now #2. Agree with treatment of anxiety/depression  by her PCP #3. Prescribe Bentyl 10 mg 1-2 by mouth a.c. when necessary #4. Routine GI followup in 3 months.

## 2010-11-29 ENCOUNTER — Encounter: Payer: Self-pay | Admitting: Internal Medicine

## 2010-12-01 ENCOUNTER — Encounter: Payer: Self-pay | Admitting: Internal Medicine

## 2010-12-07 ENCOUNTER — Encounter: Payer: Self-pay | Admitting: Family Medicine

## 2010-12-07 ENCOUNTER — Ambulatory Visit (INDEPENDENT_AMBULATORY_CARE_PROVIDER_SITE_OTHER): Payer: Medicare Other | Admitting: Family Medicine

## 2010-12-07 DIAGNOSIS — R634 Abnormal weight loss: Secondary | ICD-10-CM

## 2010-12-07 DIAGNOSIS — R079 Chest pain, unspecified: Secondary | ICD-10-CM

## 2010-12-07 DIAGNOSIS — J438 Other emphysema: Secondary | ICD-10-CM

## 2010-12-07 DIAGNOSIS — M1610 Unilateral primary osteoarthritis, unspecified hip: Secondary | ICD-10-CM

## 2010-12-07 DIAGNOSIS — R002 Palpitations: Secondary | ICD-10-CM

## 2010-12-07 DIAGNOSIS — F341 Dysthymic disorder: Secondary | ICD-10-CM

## 2010-12-07 DIAGNOSIS — B0229 Other postherpetic nervous system involvement: Secondary | ICD-10-CM

## 2010-12-07 DIAGNOSIS — A048 Other specified bacterial intestinal infections: Secondary | ICD-10-CM

## 2010-12-07 NOTE — Progress Notes (Signed)
[name] [id number] 1941-03-29 12/07/2010      Progress Note-Follow Up  Subjective  Chief Complaint  Chief Complaint  Patient presents with  . Follow-up    HPI  70 year old Caucasian female who is in today for follow up of multiple medical problems. Overall she is feeling greatly improved. She is denying chest pain, palpitations, shortness of breath and wheezing. She has not continued her albuterol. She reports her strength is improving. She says she's sleeping better and her handling her stressors somewhat better. She's not had any concerning side effects with her citalopram including increased nausea or poor sleep. Overall she says her appetite improved food is beginning to take prescription again. Very little dyspepsia no abdominal pain. She continues to drink one boost in the morning but is eating more meals throughout the day without difficulty. She continues to have hip pain and actually sees Dr. Cleotis Nipper her orthopedist tomorrow to discuss her options. In the meantime she uses Lidoderm patches intermittently and Tylenol and that is sufficient. He does still affect her gait and her steadiness that time so she is interested in pursuing possible correction. Patient denies any fevers, chills, GI or GU complaints at this time. Past Medical History  Diagnosis Date  . HYPERTHYROIDISM 03/11/2007  . Irritable bowel syndrome 05/19/2009  . HYPERLIPIDEMIA 03/11/2007  . ANEMIA 05/19/2009  . VITAMIN D DEFICIENCY 05/19/2009  . WEIGHT LOSS 03/11/2007  . HYPOKALEMIA 05/19/2009  . Diarrhea 05/19/2009  . ARTHRITIS, HIP 05/19/2009  . ANXIETY DEPRESSION 05/19/2009  . COLITIS 05/22/2007  . UTI 03/11/2007  . Palpitations 10/24/2010  . Lymphadenopathy 11/01/2010  . Chest pain 10/24/2010  . Emphysema 11/01/2010  . History of chicken pox   . History of measles   . H. pylori infection 11/15/2010  . Shingles 11/15/2010  . Urticaria 11/15/2010  . Postherpetic neuralgia 11/23/2010    Past Surgical History    Procedure Date  . Abdominal hysterectomy     total  . Appendectomy     Family History  Problem Relation Age of Onset  . Alzheimer's disease Mother   . Other Father     blood clot  . Pulmonary embolism Father   . Cancer Sister 30    breast  . Emphysema Sister   . Cancer Maternal Aunt     breast  . Heart attack Paternal Grandfather   . Cancer Sister     bladder ca  . Emphysema Sister   . Cancer Sister     breast  . Emphysema Sister   . GER disease Brother   . Arthritis Sister     History   Social History  . Marital Status: Married    Spouse Name: N/A    Number of Children: N/A  . Years of Education: N/A   Occupational History  . Not on file.   Social History Main Topics  . Smoking status: Never Smoker   . Smokeless tobacco: Never Used  . Alcohol Use: No  . Drug Use: No  . Sexually Active: No   Other Topics Concern  . Not on file   Social History Narrative  . No narrative on file    Current Outpatient Prescriptions on File Prior to Visit  Medication Sig Dispense Refill  . albuterol (PROVENTIL,VENTOLIN) 90 MCG/ACT inhaler Inhale 2 puffs into the lungs every 6 (six) hours as needed for wheezing.  17 g  12  . ALPRAZolam (XANAX) 0.25 MG tablet Take 1 tablet (0.25 mg total) by mouth 3 (three) times daily  as needed. For stress  90 tablet  1  . Cholecalciferol (VITAMIN D) 1000 UNITS capsule Take 1 capsule (1,000 Units total) by mouth daily.  30 capsule  11  . citalopram (CELEXA) 10 MG tablet Take 1 tablet (10 mg total) by mouth daily.  30 tablet  2  . dicyclomine (BENTYL) 10 MG capsule Take 1 capsule (10 mg total) by mouth 2 (two) times daily as needed. 1-2 by mouth before meals as needed  100 capsule  3  . lactobacillus acidophilus & bulgar (LACTINEX) chewable tablet Chew 2 tablets by mouth 3 (three) times daily with meals.  180 tablet  3  . lidocaine (LIDODERM) 5 % 2 patches topically to affected area daily, off after 12 hours , wait 12 hours and  repeat  Discard previous rx for only 10 patches  Dx: Postherpetic Neuralgia right hip  60 patch  1  . lidocaine (LIDODERM) 5 % 2 patches to affected area daily, remove after 12 hours then repeat after 12 more hours  60 patch  3  . loratadine (CLARITIN) 10 MG tablet Take 1 tablet (10 mg total) by mouth daily.  30 tablet  11  . omeprazole (PRILOSEC) 20 MG capsule Take 1 capsule (20 mg total) by mouth 2 (two) times daily. X 10 days  20 capsule  0    No Known Allergies  Review of Systems  Review of Systems  Constitutional: Negative for fever and malaise/fatigue.  HENT: Negative for congestion.   Eyes: Negative for discharge.  Respiratory: Negative for shortness of breath.   Cardiovascular: Negative for chest pain, palpitations and leg swelling.  Gastrointestinal: Positive for diarrhea. Negative for heartburn, nausea, vomiting, abdominal pain, constipation, blood in stool and melena.  Genitourinary: Negative for dysuria, urgency and frequency.  Musculoskeletal: Positive for joint pain. Negative for falls.       Right hip pain  Skin: Negative for rash.  Neurological: Negative for loss of consciousness and headaches.  Endo/Heme/Allergies: Negative for environmental allergies and polydipsia.  Psychiatric/Behavioral: Positive for depression. Negative for suicidal ideas and substance abuse. The patient is not nervous/anxious and does not have insomnia.     Objective  BP 104/64  Pulse 70  Temp(Src) 97.7 F (36.5 C) (Oral)  Ht 5\' 1"  (1.549 m)  Wt 93 lb 1.9 oz (42.239 kg)  BMI 17.59 kg/m2  SpO2 98%  Physical Exam  Physical Exam  Constitutional: She is oriented to person, place, and time and well-developed, well-nourished, and in no distress. No distress.       thin  HENT:  Head: Normocephalic and atraumatic.  Eyes: Conjunctivae are normal.  Neck: Neck supple. No thyromegaly present.  Cardiovascular: Normal rate, regular rhythm and normal heart sounds.   Pulmonary/Chest: Effort  normal and breath sounds normal. She has no wheezes.  Abdominal: She exhibits no distension and no mass.  Musculoskeletal: She exhibits no edema.  Lymphadenopathy:    She has no cervical adenopathy.  Neurological: She is alert and oriented to person, place, and time.  Skin: Skin is warm and dry. No rash noted. She is not diaphoretic.  Psychiatric: Memory, affect and judgment normal.    Lab Results  Component Value Date   TSH 1.33 10/31/2010   Lab Results  Component Value Date   WBC 6.3 10/31/2010   HGB 13.5 10/31/2010   HCT 39.8 10/31/2010   MCV 99.6 10/31/2010   PLT 112.0* 10/31/2010   Lab Results  Component Value Date   CREATININE 0.7 10/31/2010  BUN 13 10/31/2010   NA 138 10/31/2010   K 4.5 10/31/2010   CL 98 10/31/2010   CO2 31 10/31/2010   Lab Results  Component Value Date   ALT 15 05/19/2009   AST 22 05/19/2009   ALKPHOS 58 05/19/2009   BILITOT 0.9 05/19/2009   Lab Results  Component Value Date   CHOL  Value: 192        ATP III CLASSIFICATION:  <200     mg/dL   Desirable  696-295  mg/dL   Borderline High  >=284    mg/dL   High        1/32/4401   Lab Results  Component Value Date   HDL 55 10/25/2010   Lab Results  Component Value Date   LDLCALC  Value: 126        Total Cholesterol/HDL:CHD Risk Coronary Heart Disease Risk Table                     Men   Women  1/2 Average Risk   3.4   3.3  Average Risk       5.0   4.4  2 X Average Risk   9.6   7.1  3 X Average Risk  23.4   11.0        Use the calculated Patient Ratio above and the CHD Risk Table to determine the patient's CHD Risk.        ATP III CLASSIFICATION (LDL):  <100     mg/dL   Optimal  027-253  mg/dL   Near or Above                    Optimal  130-159  mg/dL   Borderline  664-403  mg/dL   High  >474     mg/dL   Very High* 2/59/5638   Lab Results  Component Value Date   TRIG 55 10/25/2010   Lab Results  Component Value Date   CHOLHDL 3.5 10/25/2010     Assessment & Plan  Emphysema No recent flares or need for her  Albuterol, she does have it with her if she needs it.  Chest pain Is improved at this time. No changes in therapy.   ARTHRITIS, HIP Patient is seeing orthopaedics tomorrow to discuss her options. She has pain daily and manages it relatively well but it does limit her activities and her stability. Encouraged to use her Lidoderm Patches for any strenuous activites and prolonged sitting.  ANXIETY DEPRESSION Patient tolerating Citalopram without any concerning side effects. She seems more animated today and she reports her mood is improving. Is generally not needing Alprazolam and using it infrequently. No change to therapy. Will reevaluate in 3-4 weeks or as needed.  Palpitations No recent episodes.  Postherpetic neuralgia Pain is improving, is interested in getting Shingles vaccine in a couple of months  WEIGHT LOSS Weight gain since last visit. She notes her appetite is finally improving. She is eating better and not having increased heartburn. She continues to take a Boost supplement in the morning since she does not like eating breakfast  H. pylori infection She was able to complete the course of antibiotics and is now down to just one Omeprazole daily. No significant dyspepsia or discomfort at this time. Continue same

## 2010-12-07 NOTE — Assessment & Plan Note (Signed)
Weight gain since last visit. She notes her appetite is finally improving. She is eating better and not having increased heartburn. She continues to take a Boost supplement in the morning since she does not like eating breakfast

## 2010-12-07 NOTE — Patient Instructions (Signed)
Heartburn  Heartburn is a painful, burning sensation in the chest. It may feel worse in certain positions, such as lying down or bending over. It is caused by stomach acid backing up into the tube that carries food from the mouth down to the stomach (lower esophagus).    CAUSES  A number of conditions can cause or worsen heartburn, including:     Pregnancy.    Being overweight (obesity).    A condition called hiatal hernia, in which part or all of the stomach is moved up into the chest through a weakness in the diaphragm muscle.    Alcohol.    Exercise.    Eating just before going to bed.    Overeating.    Medications, including:    Nonsteroidal anti-inflammatory drugs, such as ibuprofen and naproxen.    Aspirin.    Some blood pressure medicines, including beta-blockers, calcium channel blockers, and alpha-blockers.    Nitrates (used to treat angina).    The asthma medication Theophylline.    Certain sedative drugs.    Heartburn may be worse after eating certain foods. These heartburn-causing foods are different for different people, but may include:    Peppers.    Chocolate.    Coffee.    High-fat foods, including fried foods.    Spicy foods.    Garlic, onions.    Citrus fruits, including oranges, grapefruit, lemons and limes.    Food containing tomatoes or tomato products.    Mint.    Carbonated beverages.    Vinegar.   SYMPTOMS    Symptoms may last for a few minutes or a few hours, and can include:   Burning pain in the chest or lower throat.    Bitter taste in the mouth.    Coughing.   DIAGNOSIS  If the usual treatments for heartburn do not improve your symptoms, then tests may be done to see if there is another condition present. Possible tests may include:   X-rays.    Endoscopy. This is when a tube with a light and a camera on the end is used to examine the esophagus and the stomach.    Blood, breath, or stool tests may be used to check for bacteria that cause ulcers.    TREATMENT  There are a number of non-prescription medicines used to treat heartburn, including:   Antacids.    Acid reducers (also called H-2 blockers).    Proton-pump inhibitors.   HOME CARE INSTRUCTIONS   Raise the head of your bed by putting blocks under the legs.    Eat 2-3 hours before going to bed.    Stop smoking.    Try to reach and maintain a healthy weight.    Do not eat just a few very large meals. Instead, eat many smaller meals throughout the day.    Try to identify foods and beverages that make your symptoms worse, and avoid these.    Avoid tight clothing.    Do not exercise right after eating.   SEEK IMMEDIATE MEDICAL CARE IF YOU:   Have severe chest pain that goes down your arm, or into your jaw or neck.    Feel sweaty, dizzy, or lightheaded.    Are short of breath.    Throw up (vomit) blood.    Have difficulty or pain with swallowing.    Have bloody or black, tarry stools.    Have bouts of heartburn more than three times a week for more than two weeks.   

## 2010-12-07 NOTE — Assessment & Plan Note (Signed)
Patient is seeing orthopaedics tomorrow to discuss her options. She has pain daily and manages it relatively well but it does limit her activities and her stability. Encouraged to use her Lidoderm Patches for any strenuous activites and prolonged sitting.

## 2010-12-07 NOTE — Assessment & Plan Note (Signed)
Pain is improving, is interested in getting Shingles vaccine in a couple of months

## 2010-12-07 NOTE — Assessment & Plan Note (Signed)
No recent flares or need for her Albuterol, she does have it with her if she needs it.

## 2010-12-07 NOTE — Assessment & Plan Note (Signed)
No recent episodes

## 2010-12-07 NOTE — Assessment & Plan Note (Signed)
Is improved at this time. No changes in therapy.

## 2010-12-07 NOTE — Assessment & Plan Note (Signed)
Patient tolerating Citalopram without any concerning side effects. She seems more animated today and she reports her mood is improving. Is generally not needing Alprazolam and using it infrequently. No change to therapy. Will reevaluate in 3-4 weeks or as needed.

## 2010-12-07 NOTE — Assessment & Plan Note (Signed)
She was able to complete the course of antibiotics and is now down to just one Omeprazole daily. No significant dyspepsia or discomfort at this time. Continue same

## 2010-12-08 LAB — GIARDIA ANTIGEN: Giardia Screen (EIA): NEGATIVE

## 2010-12-08 LAB — CRYPTOSPORIDIUM ANTIGEN, STOOL: Cryptosporidium Screen (EIA): NEGATIVE

## 2010-12-09 LAB — CLOSTRIDIUM DIFFICILE EIA: CDIFTX: NEGATIVE

## 2010-12-10 LAB — STOOL CULTURE

## 2010-12-26 ENCOUNTER — Encounter: Payer: Self-pay | Admitting: Family Medicine

## 2010-12-28 ENCOUNTER — Ambulatory Visit: Payer: Medicare Other | Admitting: Family Medicine

## 2011-01-08 ENCOUNTER — Encounter: Payer: Self-pay | Admitting: Family Medicine

## 2011-05-16 ENCOUNTER — Ambulatory Visit (INDEPENDENT_AMBULATORY_CARE_PROVIDER_SITE_OTHER): Payer: Medicare Other

## 2011-05-16 DIAGNOSIS — Z23 Encounter for immunization: Secondary | ICD-10-CM

## 2011-06-04 ENCOUNTER — Telehealth: Payer: Self-pay

## 2011-06-04 MED ORDER — TRAMADOL HCL 50 MG PO TABS: 50.0000 mg | ORAL_TABLET | Freq: Every evening | ORAL | Status: DC | PRN

## 2011-06-04 NOTE — Telephone Encounter (Signed)
Notify patient I am somewhat hesitant to start an NSAID at her age with her stomach problems, would rather start with some Tramadol 50mg  po qhs prn pain. It is a pain reliever that can cause some sedation but usually does not cause much. It should help quiet the pain and let her rest. Disp #30, schedule an appt and let us know if it does not work.

## 2011-06-04 NOTE — Telephone Encounter (Signed)
Pt called and stated she would like an antiinflammatory called in for her hip pain? Pt stated it doesn't really hurt during the day but when she lays down to go to bed it bothers her and she would like something to take the edge off? Please advise? [phone]

## 2011-06-12 NOTE — Telephone Encounter (Signed)
Patient wanted a note on her chart that she developed hives and couldn't sleep when she took Tramadol. Patient only took two & has stopped taking them. She is not asking for another medication.

## 2011-07-04 ENCOUNTER — Encounter: Payer: Self-pay | Admitting: Family Medicine

## 2011-07-10 ENCOUNTER — Other Ambulatory Visit: Payer: Self-pay | Admitting: Orthopedic Surgery

## 2011-07-17 ENCOUNTER — Telehealth: Payer: Self-pay

## 2011-07-17 NOTE — Telephone Encounter (Signed)
I informed patient that we received paperwork from [facility] and we need to schedule an appt for md to sign off of the clearance. Pt states she will call and make an appt after Christmas.

## 2011-08-16 ENCOUNTER — Encounter: Payer: Self-pay | Admitting: Family Medicine

## 2011-08-16 ENCOUNTER — Ambulatory Visit (INDEPENDENT_AMBULATORY_CARE_PROVIDER_SITE_OTHER): Payer: Medicare Other | Admitting: Family Medicine

## 2011-08-16 VITALS — BP 145/72 | HR 74 | Temp 97.8°F | Ht 61.0 in | Wt 91.4 lb

## 2011-08-16 DIAGNOSIS — M1610 Unilateral primary osteoarthritis, unspecified hip: Secondary | ICD-10-CM

## 2011-08-16 DIAGNOSIS — F329 Major depressive disorder, single episode, unspecified: Secondary | ICD-10-CM

## 2011-08-16 DIAGNOSIS — E876 Hypokalemia: Secondary | ICD-10-CM

## 2011-08-16 DIAGNOSIS — E785 Hyperlipidemia, unspecified: Secondary | ICD-10-CM

## 2011-08-16 DIAGNOSIS — R002 Palpitations: Secondary | ICD-10-CM

## 2011-08-16 DIAGNOSIS — F341 Dysthymic disorder: Secondary | ICD-10-CM

## 2011-08-16 DIAGNOSIS — Z23 Encounter for immunization: Secondary | ICD-10-CM

## 2011-08-16 DIAGNOSIS — J438 Other emphysema: Secondary | ICD-10-CM

## 2011-08-16 DIAGNOSIS — Z01818 Encounter for other preprocedural examination: Secondary | ICD-10-CM

## 2011-08-16 DIAGNOSIS — F419 Anxiety disorder, unspecified: Secondary | ICD-10-CM

## 2011-08-16 DIAGNOSIS — R634 Abnormal weight loss: Secondary | ICD-10-CM

## 2011-08-16 HISTORY — DX: Encounter for other preprocedural examination: Z01.818

## 2011-08-16 LAB — HEPATIC FUNCTION PANEL
ALT: 15 U/L (ref 0–35)
AST: 20 U/L (ref 0–37)
Albumin: 4.7 g/dL (ref 3.5–5.2)
Alkaline Phosphatase: 57 U/L (ref 39–117)
Bilirubin, Direct: 0.1 mg/dL (ref 0.0–0.3)
Total Bilirubin: 0.8 mg/dL (ref 0.3–1.2)
Total Protein: 6.8 g/dL (ref 6.0–8.3)

## 2011-08-16 LAB — LIPID PANEL
Cholesterol: 203 mg/dL — ABNORMAL HIGH (ref 0–200)
HDL: 67.1 mg/dL (ref >39.00)
Total CHOL/HDL Ratio: 3
Triglycerides: 58 mg/dL (ref 0.0–149.0)
VLDL: 11.6 mg/dL (ref 0.0–40.0)

## 2011-08-16 LAB — CBC
HCT: 39.6 % (ref 36.0–46.0)
Hemoglobin: 13.5 g/dL (ref 12.0–15.0)
MCHC: 34 g/dL (ref 30.0–36.0)
MCV: 98.3 fl (ref 78.0–100.0)
Platelets: 221 10*3/uL (ref 150.0–400.0)
RBC: 4.03 Mil/uL (ref 3.87–5.11)
RDW: 12.8 % (ref 11.5–14.6)
WBC: 5.3 10*3/uL (ref 4.5–10.5)

## 2011-08-16 LAB — RENAL FUNCTION PANEL
Albumin: 4.7 g/dL (ref 3.5–5.2)
BUN: 8 mg/dL (ref 6–23)
CO2: 26 mEq/L (ref 19–32)
Calcium: 9.6 mg/dL (ref 8.4–10.5)
Chloride: 99 mEq/L (ref 96–112)
Creatinine, Ser: 0.6 mg/dL (ref 0.4–1.2)
GFR: 109.13 mL/min (ref >60.00)
Glucose, Bld: 103 mg/dL — ABNORMAL HIGH (ref 70–99)
Phosphorus: 3.5 mg/dL (ref 2.3–4.6)
Potassium: 4.9 mEq/L (ref 3.5–5.1)
Sodium: 134 mEq/L — ABNORMAL LOW (ref 135–145)

## 2011-08-16 LAB — POCT URINALYSIS DIPSTICK
Bilirubin, UA: NEGATIVE
Glucose, UA: NEGATIVE
Ketones, UA: NEGATIVE
Leukocytes, UA: NEGATIVE
Nitrite, UA: NEGATIVE
Protein, UA: NEGATIVE
Spec Grav, UA: 1.01
Urobilinogen, UA: 0.2
pH, UA: 6.5

## 2011-08-16 LAB — LDL CHOLESTEROL, DIRECT: Direct LDL: 115.9 mg/dL

## 2011-08-16 LAB — TSH: TSH: 0.75 u[IU]/mL (ref 0.35–5.50)

## 2011-08-16 MED ORDER — ALPRAZOLAM 1 MG PO TABS: ORAL_TABLET | ORAL | Status: DC

## 2011-08-16 NOTE — Assessment & Plan Note (Signed)
No recent flares or need for Albuterol

## 2011-08-16 NOTE — Assessment & Plan Note (Signed)
Preparing THR in March 2013

## 2011-08-16 NOTE — Assessment & Plan Note (Signed)
Patient is preparing to have a right hip replacement in March due to worsening debility and hip pain. We have asked her to have an appt with cardiology due to her episodes of palpitations with associated SOB with exertion. As long as she is able to be cleared by cardiology we will clear her here.

## 2011-08-16 NOTE — Assessment & Plan Note (Signed)
resolved 

## 2011-08-16 NOTE — Assessment & Plan Note (Addendum)
Continues to have trouble with intermittent palpitations, only happens once or twice a month with some increased exertion and is followed by some SOB, resolves with rest, no associated CP

## 2011-08-16 NOTE — Patient Instructions (Signed)

## 2011-08-16 NOTE — Progress Notes (Signed)
Patient ID: [name], female   DOB: [birth year], 71 y.o.   MRN: [id number] [name] [id number] 15-Jun-1941 08/16/2011      Progress Note New Patient  Subjective  Chief Complaint  Chief Complaint  Patient presents with  . surgery clearance    hip replacement    HPI  Patient is a 71 year old Caucasian female in today for Preop clearance. She is struggling with worsening pain and disability in her right hip and is ready to proceed with Right THR in March. She denies any recent illness, fevers, chills, HA, congestion, CP, palp, SOB, GI or GU c/o. She has not needed any Albuterol lately for her breathing. She is eating well but acknowledges being under a great deal of stress helping to care for her husband as his health deteriorates. She was given an rx for Citalopram at her last but did not start it, she is willing to start it now. She did use Alprazolam and had no sedation with it or side effects that concern her.  Past Medical History  Diagnosis Date  . HYPERTHYROIDISM 03/11/2007  . Irritable bowel syndrome 05/19/2009  . HYPERLIPIDEMIA 03/11/2007  . ANEMIA 05/19/2009  . VITAMIN D DEFICIENCY 05/19/2009  . WEIGHT LOSS 03/11/2007  . HYPOKALEMIA 05/19/2009  . Diarrhea 05/19/2009  . ARTHRITIS, HIP 05/19/2009  . ANXIETY DEPRESSION 05/19/2009  . COLITIS 05/22/2007  . UTI 03/11/2007  . Palpitations 10/24/2010  . Lymphadenopathy 11/01/2010  . Chest pain 10/24/2010  . Emphysema 11/01/2010  . History of chicken pox   . History of measles   . H. pylori infection 11/15/2010  . Shingles 11/15/2010  . Urticaria 11/15/2010  . Postherpetic neuralgia 11/23/2010    Past Surgical History  Procedure Date  . Abdominal hysterectomy     total  . Appendectomy     Family History  Problem Relation Age of Onset  . Alzheimer's disease Mother   . Other Father     blood clot  . Pulmonary embolism Father   . Cancer Sister 59    breast  . Emphysema Sister   . Cancer Maternal Aunt     breast  . Heart  attack Paternal Grandfather   . Cancer Sister     bladder ca  . Emphysema Sister   . Cancer Sister     breast  . Emphysema Sister   . GER disease Brother   . Arthritis Sister     History   Social History  . Marital Status: Married    Spouse Name: N/A    Number of Children: N/A  . Years of Education: N/A   Occupational History  . Not on file.   Social History Main Topics  . Smoking status: Never Smoker   . Smokeless tobacco: Never Used  . Alcohol Use: No  . Drug Use: No  . Sexually Active: No   Other Topics Concern  . Not on file   Social History Narrative  . No narrative on file    Current Outpatient Prescriptions on File Prior to Visit  Medication Sig Dispense Refill  . albuterol (PROVENTIL,VENTOLIN) 90 MCG/ACT inhaler Inhale 2 puffs into the lungs every 6 (six) hours as needed for wheezing.  17 g  12  . ALPRAZolam (XANAX) 0.25 MG tablet Take 1 tablet (0.25 mg total) by mouth 3 (three) times daily as needed. For stress  90 tablet  1  . Cholecalciferol (VITAMIN D) 1000 UNITS capsule Take 1 capsule (1,000 Units total) by mouth daily.  30  capsule  11  . dicyclomine (BENTYL) 10 MG capsule Take 1 capsule (10 mg total) by mouth 2 (two) times daily as needed. 1-2 by mouth before meals as needed  100 capsule  3  . lactobacillus acidophilus & bulgar (LACTINEX) chewable tablet Chew 2 tablets by mouth 3 (three) times daily with meals.  180 tablet  3  . lidocaine (LIDODERM) 5 % 2 patches topically to affected area daily, off after 12 hours , wait 12 hours and repeat  Discard previous rx for only 10 patches  Dx: Postherpetic Neuralgia right hip  60 patch  1  . loratadine (CLARITIN) 10 MG tablet Take 1 tablet (10 mg total) by mouth daily.  30 tablet  11  . omeprazole (PRILOSEC) 20 MG capsule Take 1 capsule (20 mg total) by mouth 2 (two) times daily. X 10 days  20 capsule  0  . traMADol (ULTRAM) 50 MG tablet Take 1 tablet (50 mg total) by mouth at bedtime as needed. Maximum dose=  8 tablets per day  30 tablet  0  . citalopram (CELEXA) 10 MG tablet Take 1 tablet (10 mg total) by mouth daily.  30 tablet  2    Allergies  Allergen Reactions  . Tramadol Hives    Review of Systems  Review of Systems  Constitutional: Negative for fever, chills and malaise/fatigue.  HENT: Negative for hearing loss, nosebleeds and congestion.   Eyes: Negative for discharge.  Respiratory: Negative for cough, sputum production, shortness of breath and wheezing.   Cardiovascular: Negative for chest pain, palpitations and leg swelling.  Gastrointestinal: Negative for heartburn, nausea, vomiting, abdominal pain, diarrhea, constipation and blood in stool.  Genitourinary: Negative for dysuria, urgency, frequency and hematuria.  Musculoskeletal: Negative for myalgias, back pain and falls.  Skin: Negative for rash.  Neurological: Negative for dizziness, tremors, sensory change, focal weakness, loss of consciousness, weakness and headaches.  Endo/Heme/Allergies: Negative for polydipsia. Does not bruise/bleed easily.  Psychiatric/Behavioral: Negative for depression and suicidal ideas. The patient is not nervous/anxious and does not have insomnia.     Objective  BP 145/72  Pulse 74  Temp(Src) 97.8 F (36.6 C) (Temporal)  Ht 5\' 1"  (1.549 m)  Wt 91 lb 6.4 oz (41.459 kg)  BMI 17.27 kg/m2  SpO2 98%  Physical Exam  Physical Exam  Constitutional: She is oriented to person, place, and time and well-developed, well-nourished, and in no distress. No distress.  HENT:  Head: Normocephalic and atraumatic.  Right Ear: External ear normal.  Left Ear: External ear normal.  Nose: Nose normal.  Mouth/Throat: Oropharynx is clear and moist. No oropharyngeal exudate.  Eyes: Conjunctivae are normal. Pupils are equal, round, and reactive to light. Right eye exhibits no discharge. Left eye exhibits no discharge. No scleral icterus.  Neck: Normal range of motion. Neck supple. No thyromegaly present.    Cardiovascular: Normal rate, regular rhythm, normal heart sounds and intact distal pulses.   No murmur heard. Pulmonary/Chest: Effort normal and breath sounds normal. No respiratory distress. She has no wheezes. She has no rales.  Abdominal: Soft. Bowel sounds are normal. She exhibits no distension and no mass. There is no tenderness.  Musculoskeletal: Normal range of motion. She exhibits no edema and no tenderness.  Lymphadenopathy:    She has no cervical adenopathy.  Neurological: She is alert and oriented to person, place, and time. She has normal reflexes. No cranial nerve deficit. Coordination normal.  Skin: Skin is warm and dry. No rash noted. She is not diaphoretic.  Psychiatric: Mood, memory and affect normal.       Assessment & Plan    Palpitations Continues to have trouble with intermittent palpitations, only happens once or twice a month with some increased exertion and is followed by some SOB, resolves with rest, no associated CP  Preoperative clearance Patient is preparing to have a right hip replacement in March due to worsening debility and hip pain. We have asked her to have an appt with cardiology due to her episodes of palpitations with associated SOB with exertion. As long as she is able to be cleared by cardiology we will clear her here.   HYPOKALEMIA resolved  ARTHRITIS, HIP Preparing THR in March 2013  Emphysema No recent flares or need for Albuterol

## 2011-08-17 ENCOUNTER — Telehealth: Payer: Self-pay | Admitting: Family Medicine

## 2011-08-17 NOTE — Telephone Encounter (Signed)
Patient had requested to see Dr Patty Sermons but Dr Patty Sermons is not accepting any new patients. [name] would like to know if she really needs to see a cardiologist before her hip surgery? What do you think?

## 2011-08-17 NOTE — Telephone Encounter (Signed)
Yes due to her age and palpitations followed by SOB I think we need to be absolutely positive she is OK to proceed. We can arrange a different cardiologist if she will let us

## 2011-08-17 NOTE — Telephone Encounter (Signed)
Patient will use another Cardiologist. Please refer? Preferably next week Wed or Fri

## 2011-08-17 NOTE — Telephone Encounter (Signed)
Please advise 

## 2011-08-18 NOTE — Telephone Encounter (Signed)
Can we switch the referral to another cardiologist as requested?

## 2011-08-21 ENCOUNTER — Ambulatory Visit: Payer: Medicare Other | Admitting: Cardiology

## 2011-08-22 ENCOUNTER — Encounter: Payer: Self-pay | Admitting: Cardiology

## 2011-08-22 ENCOUNTER — Ambulatory Visit (INDEPENDENT_AMBULATORY_CARE_PROVIDER_SITE_OTHER): Payer: Medicare Other | Admitting: Cardiology

## 2011-08-22 DIAGNOSIS — R0609 Other forms of dyspnea: Secondary | ICD-10-CM | POA: Insufficient documentation

## 2011-08-22 DIAGNOSIS — R002 Palpitations: Secondary | ICD-10-CM

## 2011-08-22 DIAGNOSIS — Z01810 Encounter for preprocedural cardiovascular examination: Secondary | ICD-10-CM

## 2011-08-22 DIAGNOSIS — R0600 Dyspnea, unspecified: Secondary | ICD-10-CM

## 2011-08-22 NOTE — Patient Instructions (Signed)
Your physician has requested that you have a lexiscan myoview. For further information please visit [email or web address]. Please follow instruction sheet, as given.  Your physician recommends that you continue on your current medications as directed. Please refer to the Current Medication list given to you today.  

## 2011-08-22 NOTE — Progress Notes (Signed)
Reason for Consult: Preoperative clearance Referring Physician: Dr. Joaquin Courts; Dr. Ollen Gross  [name] is an 71 y.o. female.  HPI: This pleasant 71 year old woman is seen for preoperative cardiac clearance regarding upcoming right hip replacement scheduled for March 4.  [name] is being evaluated because of a history of shortness of breath with exertion and a history of palpitations.  She does not have any history of documented coronary disease.  She does not have any history of a heart murmur.  She states that she was hospitalized in April 2012 at [facility] for palpitations but nothing was found other than for some "emphysema".  [name] has never smoked but was subjected to secondhand smoke because her husband was a heavy smoker.  [name] sleeps on one pillow.  She does not have any paroxysmal nocturnal dyspnea.  She does have exertional dyspnea with walking and with doing housework such as vacuuming.  She does not get any regular exercise because of her progressively more painful right hip arthritis.  Past Medical History  Diagnosis Date  . HYPERTHYROIDISM 03/11/2007  . Irritable bowel syndrome 05/19/2009  . HYPERLIPIDEMIA 03/11/2007  . ANEMIA 05/19/2009  . VITAMIN D DEFICIENCY 05/19/2009  . WEIGHT LOSS 03/11/2007  . HYPOKALEMIA 05/19/2009  . Diarrhea 05/19/2009  . ARTHRITIS, HIP 05/19/2009  . ANXIETY DEPRESSION 05/19/2009  . COLITIS 05/22/2007  . UTI 03/11/2007  . Palpitations 10/24/2010  . Lymphadenopathy 11/01/2010  . Chest pain 10/24/2010  . Emphysema 11/01/2010  . History of chicken pox   . History of measles   . H. pylori infection 11/15/2010  . Shingles 11/15/2010  . Urticaria 11/15/2010  . Postherpetic neuralgia 11/23/2010  . Preoperative clearance 08/16/2011    Past Surgical History  Procedure Date  . Abdominal hysterectomy     total  . Appendectomy     Family History  Problem Relation Age of Onset  . Alzheimer's disease Mother   . Other  Father     blood clot  . Pulmonary embolism Father   . Cancer Sister 80    breast  . Emphysema Sister   . Cancer Maternal Aunt     breast  . Heart attack Paternal Grandfather   . Cancer Sister     bladder ca  . Emphysema Sister   . Cancer Sister     breast  . Emphysema Sister   . GER disease Brother   . Arthritis Sister     Social History:  reports that she has never smoked. She has never used smokeless tobacco. She reports that she does not drink alcohol or use illicit drugs.  Allergies:  Allergies  Allergen Reactions  . Tramadol Hives    Medications: I have reviewed [name]'s current medications.  No results found for this or any previous visit (from Wendy past 48 hour(s)).  No results found.  Review of systems negative except as noted above.  [name] does have a tendency toward diarrhea.  She has had a condition similar to diverticulosis in Wendy past.  She denies any history of sputum production or hemoptysis or pleuritic chest pain.  She has not had syncope.  She has not been experiencing any chest pain with her exertional dyspnea. Blood pressure 128/80, pulse 80, height 5' (1.524 m), weight 91 lb (41.277 kg). Wendy general appearance reveals a thin middle-aged woman in no acute distress.Pupils equal and reactive.   Extraocular Movements are full.  There is no scleral icterus.  Wendy mouth and pharynx  are normal.  Wendy neck is supple.  Wendy carotids reveal no bruits.  Wendy jugular venous pressure is normal.  Wendy thyroid is not enlarged.  There is no lymphadenopathy.  Wendy chest is clear to percussion and auscultation. There are no rales or rhonchi. Expansion of Wendy chest is symmetrical.  Wendy precordium is quiet.  Wendy first heart sound is normal.  Wendy second heart sound is physiologically split.  There is no murmur gallop rub or click.  There is no abnormal lift or heave.  Wendy abdomen is soft and nontender. Bowel sounds are normal. Wendy liver and spleen are not enlarged. There  Are no abdominal masses. There are no bruits.  Wendy pedal pulses are good.  There is no phlebitis or edema.  There is no cyanosis or clubbing. Strength is normal and symmetrical in all extremities.  There is no lateralizing weakness.  There are no sensory deficits.  She demonstrates a coxalgic gait secondary to right hip pain.Wendy skin is warm and dry.  There is no rash.  EKG done on 08/16/11 was reviewed and shows normal sinus rhythm and no ischemic changes    Assessment/Plan: Wendy etiology of her exertional dyspnea is not presently clear.  She is significantly limited at this point both by her hip problem and her exertional dyspnea.  She is also Wendy caregiver for her husband who has poor health.  In order to clear her for proposed right hip replacement she will return soon for a Lexiscan Myoview stress test.  No new medications were prescribed today. Many thanks for Wendy opportunity to see this pleasant woman with you for preoperative cardiology clearance.  I will be in touch regarding Wendy results of Wendy stress test.  Cassell Clement 08/22/2011, 4:30 PM

## 2011-08-29 ENCOUNTER — Ambulatory Visit (HOSPITAL_COMMUNITY): Payer: Medicare Other | Attending: Cardiology | Admitting: Radiology

## 2011-08-29 DIAGNOSIS — R0789 Other chest pain: Secondary | ICD-10-CM | POA: Insufficient documentation

## 2011-08-29 DIAGNOSIS — E785 Hyperlipidemia, unspecified: Secondary | ICD-10-CM

## 2011-08-29 DIAGNOSIS — J438 Other emphysema: Secondary | ICD-10-CM | POA: Insufficient documentation

## 2011-08-29 DIAGNOSIS — R0602 Shortness of breath: Secondary | ICD-10-CM | POA: Insufficient documentation

## 2011-08-29 DIAGNOSIS — R0609 Other forms of dyspnea: Secondary | ICD-10-CM

## 2011-08-29 DIAGNOSIS — R079 Chest pain, unspecified: Secondary | ICD-10-CM

## 2011-08-29 DIAGNOSIS — R002 Palpitations: Secondary | ICD-10-CM

## 2011-08-29 DIAGNOSIS — Z01810 Encounter for preprocedural cardiovascular examination: Secondary | ICD-10-CM

## 2011-08-29 DIAGNOSIS — R0600 Dyspnea, unspecified: Secondary | ICD-10-CM

## 2011-08-29 DIAGNOSIS — Z8249 Family history of ischemic heart disease and other diseases of the circulatory system: Secondary | ICD-10-CM | POA: Insufficient documentation

## 2011-08-29 DIAGNOSIS — R0989 Other specified symptoms and signs involving the circulatory and respiratory systems: Secondary | ICD-10-CM | POA: Insufficient documentation

## 2011-08-29 MED ORDER — TECHNETIUM TC 99M TETROFOSMIN IV KIT
10.0000 | PACK | Freq: Once | INTRAVENOUS | Status: AC | PRN
  Administered 2011-08-29: 10 via INTRAVENOUS

## 2011-08-29 MED ORDER — TECHNETIUM TC 99M TETROFOSMIN IV KIT
30.0000 | PACK | Freq: Once | INTRAVENOUS | Status: AC | PRN
  Administered 2011-08-29: 30 via INTRAVENOUS

## 2011-08-29 MED ORDER — AMINOPHYLLINE 25 MG/ML IV SOLN
75.0000 mg | Freq: Once | INTRAVENOUS | Status: AC
  Administered 2011-08-29: 75 mg via INTRAVENOUS

## 2011-08-29 MED ORDER — REGADENOSON 0.4 MG/5ML IV SOLN
0.4000 mg | Freq: Once | INTRAVENOUS | Status: AC
  Administered 2011-08-29: 0.4 mg via INTRAVENOUS

## 2011-08-29 NOTE — Progress Notes (Signed)
[facility] SITE 3 NUCLEAR MED [address] [place] [address] [phone]  Cardiology Nuclear Med Study  [name] is a 71 y.o. female [id number] 05-Mar-1941   Nuclear Med Background Indication for Stress Test:  Evaluation for Ischemia and Pending Surgical Clearance: Right hip replacement 10/01/11- Dr. Ollen Gross History:  Emphysema and 10/26/10 Heart Catheterization: EF: 65%  Cardiac Risk Factors: Family History - CAD and Lipids  Symptoms:  Chest Pain, Chest Tightness, DOE, Palpitations and SOB   Nuclear Pre-Procedure Caffeine/Decaff Intake:  None NPO After: 8:00pm   Lungs:  clear IV 0.9% NS with Angio Cath:  22g  IV Site: R Wrist  IV Started by:  Stanton Kidney, EMT-P  Chest Size (in):  32 Cup Size: A  Height: 5' (1.524 m)  Weight:  91 lb (41.277 kg)  BMI:  Body mass index is 17.77 kg/(m^2). Tech Comments: The patient became very symptomatic with the Lexiscan. Joint pain, particularly posterior neck pain, and chest pain, in recovery The patient was given Aminophylline 75 mg !V with 10 cc NS flush. Within 2 minutes the patient was pain free. Aminophylline: 12:06 given     Nuclear Med Study 1 or 2 day study: 1 day  Stress Test Type:  Eugenie Birks  Reading MD: Marca Ancona, MD  Order Authorizing Provider:  T.Brackbill  Resting Radionuclide: Technetium 35m Tetrofosmin  Resting Radionuclide Dose: 11.0 mCi   Stress Radionuclide:  Technetium 75m Tetrofosmin  Stress Radionuclide Dose: 33.0 mCi           Stress Protocol Rest HR: 72 Stress HR: 126  Rest BP: 144/60 Stress BP: 166/51  Exercise Time (min): n/a METS: n/a   Predicted Max HR: 150 bpm % Max HR: 84 bpm Rate Pressure Product: 21308   Dose of Adenosine (mg):  n/a Dose of Lexiscan: 0.4 mg  Dose of Atropine (mg): n/a Dose of Dobutamine: n/a mcg/kg/min (at max HR)  Stress Test Technologist: Milana Na, EMT-P  Nuclear Technologist:  Domenic Polite, CNMT     Rest Procedure:  Myocardial  perfusion imaging was performed at rest 45 minutes following the intravenous administration of Technetium 92m Tetrofosmin. Rest ECG: NSR  Stress Procedure:  The patient received IV Lexiscan 0.4 mg over 15-seconds.  Technetium 6m Tetrofosmin injected at 30-seconds.  There were no significant changes, + sob, abdominal pain, chest tightness, and neck pain with Lexiscan.  Quantitative spect images were obtained after a 45 minute delay. Stress ECG: No significant change from baseline ECG  QPS Raw Data Images:  Normal; no motion artifact; normal heart/lung ratio. Stress Images:  Mild mid anterior perfusion defect.  Rest Images:  Normal homogeneous uptake in all areas of the myocardium. Subtraction (SDS):  Mild, reversible mid anterior perfusion defect.  Transient Ischemic Dilatation (Normal <1.22):  0.89 Lung/Heart Ratio (Normal <0.45):  0.27  Quantitative Gated Spect Images QGS EDV:  45 ml QGS ESV:  11 ml QGS cine images:  NL LV Function; NL Wall Motion QGS EF: 75%  Impression Exercise Capacity:  Lexiscan with no exercise. BP Response:  Normal blood pressure response. Clinical Symptoms:  Dyspnea, chest tightness.  ECG Impression:  No significant ST segment change suggestive of ischemia. Comparison with Prior Nuclear Study: No images to compare  Overall Impression:  Low risk stress nuclear study.  There is a mild, reversible mid anterior perfusion defect that could represent ischemia.  However, this could be due to shifting breast attenuation.  Normal EF and wall motion.   Marthella Osorno [employer]

## 2011-08-29 NOTE — Telephone Encounter (Signed)
Patient was seen by Dr Patty Sermons 08/22/11

## 2011-09-17 ENCOUNTER — Ambulatory Visit (INDEPENDENT_AMBULATORY_CARE_PROVIDER_SITE_OTHER): Payer: Medicare Other | Admitting: Family Medicine

## 2011-09-17 ENCOUNTER — Encounter: Payer: Self-pay | Admitting: Family Medicine

## 2011-09-17 DIAGNOSIS — E0590 Thyrotoxicosis, unspecified without thyrotoxic crisis or storm: Secondary | ICD-10-CM

## 2011-09-17 DIAGNOSIS — D649 Anemia, unspecified: Secondary | ICD-10-CM

## 2011-09-17 DIAGNOSIS — Z01818 Encounter for other preprocedural examination: Secondary | ICD-10-CM

## 2011-09-17 NOTE — Patient Instructions (Signed)
Pre-Surgical and Post-Surgical Guidelines for Patients These are general sets of instructions and of course some guidelines do not apply when there is an emergency present and an operation must be performed immediately. During such times, surgery is often performed under less than desirable circumstances and the best must be made of this. PREPARING FOR SURGERY  Stop taking herbal supplements two weeks Prior To Surgery (PTS).   Stop smoking at least two weeks PTS. This lowers risk during surgery. Ask your doctor for help with this if needed. The benefits are great and this is a good time for a healthy lifestyle change.   Do not take aspirin within one week PTS unless instructed otherwise. Only take over-the-counter or prescription medicines for pain, discomfort, or fever as directed by your caregiver.   Do not take anti-inflammatory medications (such as Advil or Motrin for example) for forty-eight hours PTS.   Notify your doctor if you have been on steroids (cortisone) within the past 7 days whether taken by mouth or applied to the skin. THIS IS CRITICAL.   Your doctor will discuss possible risks and complications with you before surgery. In addition to the usual risks from anesthesia, other common risks and complications include:   Blood loss and replacement (does not apply to minor surgical procedures).   Temporary increase in pain due to surgery.   Uncorrected pain or problems the surgery was meant to correct.   Infection.   New damage.   For same day surgical patients: arrange for someone to take you home from the hospital and to stay with you for twenty-four hours after the procedure. Medicine given for your procedure may prevent you from driving a car or caring for yourself. If you have not made these arrangements , surgery may have to be canceled.   Surgery will also be canceled if you fail to follow instructions before surgery.  THE DAY BEFORE SURGERY  Eat your usual meals and  a light supper. Continue fluid intake. No alcohol intake.   Do not eat or drink after midnight. Your surgery will be canceled if you eat or drink after midnight. Take your usual medicine the morning of surgery with a sip of water unless instructed otherwise. Check with your doctor if you are unsure.   Do not smoke. The longer smoking is stopped before surgery, the less likely you will be to have pulmonary (lung) problems after surgery.   Call your doctor's office the morning prior to surgery if you develop an illness or problem which may prevent you from safely having your procedure.  THE MORNING OF SURGERY  Take medicine as directed with a sip of water.   Bathe or shower.   Remove makeup, nail polish and jewelry.   Bring your glasses or contact case. Dentures will be removed before surgery.   Wear loose fitting clothing and low or no-heeled shoes.   If you are a nursing mother, ask the anesthetist how long you should wait before nursing again.   Do not bring children without other another adult to be with them. Most medical centers are unable to provide for care and supervision of children.  LET YOUR CAREGIVER KNOW ABOUT:  Allergies.   Medicine taken including herbs, eye drops, over the counter medications, and creams.   Use of steroids (by mouth or creams).   Previous problems with anesthetics or novocaine.   A family history of anesthetic problems.   Possibility of pregnancy, if this applies.   History of blood   clots (thrombophlebitis).   History of bleeding or blood problems.   Previous surgery.   Other health problems.  BEFORE THE PROCEDURE You should be present 60 minutes prior to your procedure or as directed. Bring your insurance information with you. Do not bring valuables to the hospital. AFTER THE PROCEDURE After surgery, you will be taken to the recovery area where a nurse will monitor your progress. When you are awake, stable, and taking fluids well,  (barring complications), you will be allowed to go home. Once home, an ice pack applied to your operative site for fifteen or twenty minutes four times per day may help with discomfort, and keep swelling down. You may have numbness at and around the site of the incision. You should not operate heavy machines (cars) the day after surgery and any time you are taking narcotics for pain control. FOLLOWING SURGERY Same day surgery does not mean same day recovery. Healing will take some time. You will have tenderness at the operative site and there may be some swelling and bruising at the wound site or sites. You may have some nausea. The responsible adult who will take you home should arrange to stay with you the first 24 hours.  Do not drive for twenty-four hours or until feeling normal again or when instructed by your doctor. Do not drive while taking prescription pain medications.   Do not consume alcoholic beverages.   Do not make important decisions or sign legal documents. You may resume normal diet and activities as directed. Do no heavy lifting (more than 10 pounds) or playing of contact sports.   Change dressings as directed.   Only take over-the-counter or prescription medicines for pain, discomfort, or fever as directed by your caregiver.   Keep all appointments as scheduled and follow all instructions.   Ask questions if you do not understand something.   Make sure that you and your family fully understand everything about your operation.   Progressively return to your activity levels prior to surgery.  SEEK MEDICAL CARE IF:  Increased bleeding (more than a small spot) from wounds or biopsy sites.   Redness, swelling, or increasing pain in wounds or biopsy sites.   Pus coming from wound.   An unexplained temperature over 101 F (38.3 C) (orally).   A foul smell coming from the wound or dressing.   Development of light headedness or feeling faint.  SEEK IMMEDIATE MEDICAL CARE  IF :  You develop a rash.   Have difficulty breathing.   You develop allergies.  Document Released: 04/10/2001 Document Revised: 02/08/2011 Document Reviewed: 12/01/2007 Exit[employer] Patient Information 2012 [employer]. 

## 2011-09-20 ENCOUNTER — Encounter (HOSPITAL_COMMUNITY): Payer: Self-pay | Admitting: Pharmacy Technician

## 2011-09-20 NOTE — Progress Notes (Signed)
Patient ID: Monnie Gudgel, female   DOB: Dec 03, 1940, 71 y.o.   MRN: [id number] Danai Gotto [id number] 11/01/1940 09/20/2011      Progress Note-Follow Up  Subjective  Chief Complaint  Chief Complaint  Patient presents with  . Follow-up    1 month    HPI  Patient is a 71 year old Caucasian female who is in today for follow up and preop clearance. She is anxious to proceed with her right hip replacement on March 3 with Dr. Antony Odea. She has significant pain and debility daily and needs to have her hip repaired in order to continue caring for her sick husband. She offers no other complaints. She has undergone cardiac stress testing now and other than struggling with tachycardia during her stress test she has done well since her last visit. She denies any recent illness, fevers, chills, headache, chest pain, palpitations, shortness of breath, GI or GU complaints.  Past Medical History  Diagnosis Date  . HYPERTHYROIDISM 03/11/2007  . Irritable bowel syndrome 05/19/2009  . HYPERLIPIDEMIA 03/11/2007  . ANEMIA 05/19/2009  . VITAMIN D DEFICIENCY 05/19/2009  . WEIGHT LOSS 03/11/2007  . HYPOKALEMIA 05/19/2009  . Diarrhea 05/19/2009  . ARTHRITIS, HIP 05/19/2009  . ANXIETY DEPRESSION 05/19/2009  . COLITIS 05/22/2007  . UTI 03/11/2007  . Palpitations 10/24/2010  . Lymphadenopathy 11/01/2010  . Chest pain 10/24/2010  . Emphysema 11/01/2010  . History of chicken pox   . History of measles   . H. pylori infection 11/15/2010  . Shingles 11/15/2010  . Urticaria 11/15/2010  . Postherpetic neuralgia 11/23/2010  . Preoperative clearance 08/16/2011    Past Surgical History  Procedure Date  . Abdominal hysterectomy     total  . Appendectomy     Family History  Problem Relation Age of Onset  . Alzheimer's disease Mother   . Other Father     blood clot  . Pulmonary embolism Father   . Cancer Sister 49    breast  . Emphysema Sister   . Cancer Maternal Aunt     breast  . Heart attack Paternal  Grandfather   . Cancer Sister     bladder ca  . Emphysema Sister   . Cancer Sister     breast  . Emphysema Sister   . GER disease Brother   . Arthritis Sister     History   Social History  . Marital Status: Married    Spouse Name: N/A    Number of Children: N/A  . Years of Education: N/A   Occupational History  . Not on file.   Social History Main Topics  . Smoking status: Never Smoker   . Smokeless tobacco: Never Used  . Alcohol Use: No  . Drug Use: No  . Sexually Active: No   Other Topics Concern  . Not on file   Social History Narrative  . No narrative on file    Current Outpatient Prescriptions on File Prior to Visit  Medication Sig Dispense Refill  . albuterol (PROVENTIL,VENTOLIN) 90 MCG/ACT inhaler Inhale 2 puffs into the lungs every 6 (six) hours as needed for wheezing.  17 g  12  . Cholecalciferol (VITAMIN D) 1000 UNITS capsule Take 1 capsule (1,000 Units total) by mouth daily.  30 capsule  11    Allergies  Allergen Reactions  . Tramadol Hives    Review of Systems  Review of Systems  Constitutional: Negative for fever and malaise/fatigue.  HENT: Negative for congestion.   Eyes: Negative for discharge.  Respiratory:  Negative for shortness of breath.   Cardiovascular: Negative for chest pain, palpitations and leg swelling.  Gastrointestinal: Negative for nausea, abdominal pain and diarrhea.  Genitourinary: Negative for dysuria.  Musculoskeletal: Positive for joint pain. Negative for falls.       Right hip pain  Skin: Negative for rash.  Neurological: Negative for loss of consciousness and headaches.  Endo/Heme/Allergies: Negative for polydipsia.  Psychiatric/Behavioral: Negative for depression and suicidal ideas. The patient is not nervous/anxious and does not have insomnia.     Objective  BP 126/74  Pulse 87  Temp(Src) 99.2 F (37.3 C) (Oral)  Ht 5\' 1"  (1.549 m)  Wt 89 lb (40.37 kg)  BMI 16.82 kg/m2  SpO2 96%  Physical  Exam  Physical Exam  Constitutional: She is oriented to person, place, and time and well-developed, well-nourished, and in no distress. No distress.  HENT:  Head: Normocephalic and atraumatic.  Right Ear: External ear normal.  Left Ear: External ear normal.  Nose: Nose normal.  Mouth/Throat: Oropharynx is clear and moist. No oropharyngeal exudate.  Eyes: Conjunctivae are normal. Pupils are equal, round, and reactive to light. Right eye exhibits no discharge. Left eye exhibits no discharge. No scleral icterus.  Neck: Normal range of motion. Neck supple. No thyromegaly present.  Cardiovascular: Normal rate, regular rhythm, normal heart sounds and intact distal pulses.   No murmur heard. Pulmonary/Chest: Effort normal and breath sounds normal. No respiratory distress. She has no wheezes. She has no rales.  Abdominal: Soft. Bowel sounds are normal. She exhibits no distension and no mass. There is no tenderness.  Musculoskeletal: Normal range of motion. She exhibits no edema and no tenderness.  Lymphadenopathy:    She has no cervical adenopathy.  Neurological: She is alert and oriented to person, place, and time. She has normal reflexes. No cranial nerve deficit. Coordination normal.  Skin: Skin is warm and dry. No rash noted. She is not diaphoretic.  Psychiatric: Mood, memory and affect normal.    Lab Results  Component Value Date   TSH 0.75 08/16/2011   Lab Results  Component Value Date   WBC 5.3 08/16/2011   HGB 13.5 08/16/2011   HCT 39.6 08/16/2011   MCV 98.3 08/16/2011   PLT 221.0 08/16/2011   Lab Results  Component Value Date   CREATININE 0.6 08/16/2011   BUN 8 08/16/2011   NA 134* 08/16/2011   K 4.9 08/16/2011   CL 99 08/16/2011   CO2 26 08/16/2011   Lab Results  Component Value Date   ALT 15 08/16/2011   AST 20 08/16/2011   ALKPHOS 57 08/16/2011   BILITOT 0.8 08/16/2011   Lab Results  Component Value Date   CHOL 203* 08/16/2011   Lab Results  Component Value Date   HDL  67.10 08/16/2011   Lab Results  Component Value Date   LDLCALC  Value: 126        Total Cholesterol/HDL:CHD Risk Coronary Heart Disease Risk Table                     Men   Women  1/2 Average Risk   3.4   3.3  Average Risk       5.0   4.4  2 X Average Risk   9.6   7.1  3 X Average Risk  23.4   11.0        Use the calculated Patient Ratio above and the CHD Risk Table to determine the patient's CHD Risk.  ATP III CLASSIFICATION (LDL):  <100     mg/dL   Optimal  161-096  mg/dL   Near or Above                    Optimal  130-159  mg/dL   Borderline  045-409  mg/dL   High  >811     mg/dL   Very High* 04/12/7828   Lab Results  Component Value Date   TRIG 58.0 08/16/2011   Lab Results  Component Value Date   CHOLHDL 3 08/16/2011     Assessment & Plan  Preoperative clearance Patient has had her cardiology evaluation and is doing well at this time. Barring any new symptoms or complaints patient is cleared for surgery to have her right hip replaced. She is anxious to proceed and will discuss any further concerns about surgery with surgeon prior to procedure.  HYPERTHYROIDISM tsh wnl with recent blood draw  ANEMIA Resolved.

## 2011-09-20 NOTE — Assessment & Plan Note (Addendum)
Patient has had her cardiology evaluation and is doing well at this time. Barring any new symptoms or complaints patient is cleared for surgery to have her right hip replaced. She is anxious to proceed and will discuss any further concerns about surgery with surgeon prior to procedure.

## 2011-09-20 NOTE — Assessment & Plan Note (Signed)
tsh wnl with recent blood draw

## 2011-09-20 NOTE — Assessment & Plan Note (Signed)
Resolved

## 2011-09-21 ENCOUNTER — Other Ambulatory Visit (HOSPITAL_COMMUNITY): Payer: Medicare Other

## 2011-09-24 ENCOUNTER — Ambulatory Visit (HOSPITAL_COMMUNITY)
Admission: RE | Admit: 2011-09-24 | Discharge: 2011-09-24 | Disposition: A | Discharge status: Home / Self Care | Payer: Medicare Other | Source: Ambulatory Visit | Attending: Orthopedic Surgery | Admitting: Orthopedic Surgery

## 2011-09-24 ENCOUNTER — Ambulatory Visit (HOSPITAL_COMMUNITY): Admission: RE | Admit: 2011-09-24 | Payer: Medicare Other | Source: Ambulatory Visit

## 2011-09-24 ENCOUNTER — Encounter (HOSPITAL_COMMUNITY)
Admission: RE | Admit: 2011-09-24 | Discharge: 2011-09-24 | Disposition: A | Discharge status: Home / Self Care | Payer: Medicare Other | Source: Ambulatory Visit | Attending: Orthopedic Surgery | Admitting: Orthopedic Surgery

## 2011-09-24 ENCOUNTER — Encounter (HOSPITAL_COMMUNITY): Payer: Self-pay

## 2011-09-24 DIAGNOSIS — J449 Chronic obstructive pulmonary disease, unspecified: Secondary | ICD-10-CM | POA: Insufficient documentation

## 2011-09-24 DIAGNOSIS — Z01812 Encounter for preprocedural laboratory examination: Secondary | ICD-10-CM | POA: Insufficient documentation

## 2011-09-24 DIAGNOSIS — J4489 Other specified chronic obstructive pulmonary disease: Secondary | ICD-10-CM | POA: Insufficient documentation

## 2011-09-24 DIAGNOSIS — Z01818 Encounter for other preprocedural examination: Secondary | ICD-10-CM | POA: Insufficient documentation

## 2011-09-24 DIAGNOSIS — E0590 Thyrotoxicosis, unspecified without thyrotoxic crisis or storm: Secondary | ICD-10-CM | POA: Insufficient documentation

## 2011-09-24 DIAGNOSIS — M1610 Unilateral primary osteoarthritis, unspecified hip: Secondary | ICD-10-CM | POA: Insufficient documentation

## 2011-09-24 DIAGNOSIS — M169 Osteoarthritis of hip, unspecified: Secondary | ICD-10-CM | POA: Insufficient documentation

## 2011-09-24 HISTORY — DX: Chronic obstructive pulmonary disease, unspecified: J44.9

## 2011-09-24 HISTORY — DX: Unspecified osteoarthritis, unspecified site: M19.90

## 2011-09-24 LAB — URINALYSIS, ROUTINE W REFLEX MICROSCOPIC
Bilirubin Urine: NEGATIVE
Glucose, UA: NEGATIVE mg/dL
Hgb urine dipstick: NEGATIVE
Ketones, ur: NEGATIVE mg/dL
Leukocytes, UA: NEGATIVE
Nitrite: NEGATIVE
Protein, ur: NEGATIVE mg/dL
Specific Gravity, Urine: 1.01 (ref 1.005–1.030)
Urobilinogen, UA: 0.2 mg/dL (ref 0.0–1.0)
pH: 6.5 (ref 5.0–8.0)

## 2011-09-24 LAB — COMPREHENSIVE METABOLIC PANEL
ALT: 12 U/L (ref 0–35)
AST: 19 U/L (ref 0–37)
Albumin: 4.5 g/dL (ref 3.5–5.2)
Alkaline Phosphatase: 69 U/L (ref 39–117)
BUN: 8 mg/dL (ref 6–23)
CO2: 29 mEq/L (ref 19–32)
Calcium: 9.8 mg/dL (ref 8.4–10.5)
Chloride: 99 mEq/L (ref 96–112)
Creatinine, Ser: 0.56 mg/dL (ref 0.50–1.10)
GFR calc Af Amer: >90 mL/min (ref >90)
GFR calc non Af Amer: >90 mL/min (ref >90)
Glucose, Bld: 89 mg/dL (ref 70–99)
Potassium: 4.1 mEq/L (ref 3.5–5.1)
Sodium: 136 mEq/L (ref 135–145)
Total Bilirubin: 0.6 mg/dL (ref 0.3–1.2)
Total Protein: 7.1 g/dL (ref 6.0–8.3)

## 2011-09-24 LAB — PROTIME-INR
INR: 1.01 (ref 0.00–1.49)
Prothrombin Time: 13.5 seconds (ref 11.6–15.2)

## 2011-09-24 LAB — CBC
HCT: 41.4 % (ref 36.0–46.0)
Hemoglobin: 14.3 g/dL (ref 12.0–15.0)
MCH: 33 pg (ref 26.0–34.0)
MCHC: 34.5 g/dL (ref 30.0–36.0)
MCV: 95.6 fL (ref 78.0–100.0)
Platelets: 234 10*3/uL (ref 150–400)
RBC: 4.33 MIL/uL (ref 3.87–5.11)
RDW: 11.7 % (ref 11.5–15.5)
WBC: 6.4 10*3/uL (ref 4.0–10.5)

## 2011-09-24 LAB — APTT: aPTT: 30 seconds (ref 24–37)

## 2011-09-24 LAB — SURGICAL PCR SCREEN
MRSA, PCR: NEGATIVE
Staphylococcus aureus: NEGATIVE

## 2011-09-24 MED ORDER — CHLORHEXIDINE GLUCONATE 4 % EX LIQD: 60.0000 mL | Freq: Once | CUTANEOUS | Status: DC

## 2011-09-24 NOTE — Patient Instructions (Signed)
20 [name]  09/24/2011   Your procedure is scheduled on:  10/01/11 1235pm-155pm  Report to [facility] at 1000 AM.  Call this number if you have problems the morning of surgery: [phone]   Remember:   Do not eat food:After Midnight.  May have clear liquids:until Midnight .    Take these medicines the morning of surgery with A SIP OF WATER:    Do not wear jewelry, make-up or nail polish.  Do not wear lotions, powders, or perfumes.     Do not bring valuables to the hospital.  Contacts, dentures or bridgework may not be worn into surgery.  Leave suitcase in the car. After surgery it may be brought to your room.  For patients admitted to the hospital, checkout time is 11:00 AM the day of discharge.     Special Instructions: CHG Shower Use Special Wash: 1/2 bottle night before surgery and 1/2 bottle morning of surgery. shower chin to toes.  Wash face and private parts with regular soap.   Please read over the following fact sheets that you were given: MRSA Information, coughing and deep breathing exercises, leg exercises, Incentive Spirometry Fact Sheet, Blood Transfusion Fact Sheet

## 2011-09-24 NOTE — Pre-Procedure Instructions (Signed)
08/06/11 EKG in [facility]  CT of CHEST done 4/12 in [facility]  08/30/11 Stress Test in EPIC  LOV with Dr Patty Sermons( cardiology ) 08/22/11 in [facility]  09/17/11 PREOP CLEARANCE in EPIC

## 2011-09-30 ENCOUNTER — Other Ambulatory Visit: Payer: Self-pay | Admitting: Orthopedic Surgery

## 2011-09-30 NOTE — H&P (Signed)
[name]  DOB: 10/12/1940 Married / Female  Date of Admission:  10/01/2011  Chief Complaint:    History of Present Illness  The patient is a 70 year old female who comes in  for a preoperative History and Physical. The patient is scheduled for a right total hip arthroplasty to be performed by Dr. Frank V. Aluisio, MD at [facility]. The patient is a 70 year old female who presents for their right hip pain. They have had an IA injection on 04-23-2011. Symptoms reported today include: pain. The patient feels that they are doing poorly (Patient states that she hurts all the time and is having trouble sleeping at night. Patient is taking Aleve and sometimes a Tylenol.). [name] states that since we saw her last spring, her hip has gotten progressively worse. The initial intraarticular injection last spring did help quite a bit but then her pain recurred significantly and she had a second intraarticular injection at the end of September. Unfortunately, that did not provide much benefit. She feels like the hip is getting progressively worse. It is hurting at all times. It is limiting what she can and cannot do. She has pain at night. She said she has essentially no mobility in the hip anymore and is very scared and fearful of falling. Her husband [name], on whom I have done knee replacements, unfortunately is not doing well from a health standpoint. She states she is having a very hard time caring for him due to her hip problem. She is ready to proceed with hip replacement in March. They have been treated conservatively in the past for the above stated problem and despite conservative measures, they continue to have progressive pain and severe functional limitations and dysfunction. They have failed non-operative management including home exercise, medications, and injections. It is felt that they would benefit from undergoing total joint replacement. Risks and benefits of the  procedure have been discussed with the patient and they elect to proceed with surgery. There are no active contraindications to surgery such as ongoing infection or rapidly progressive neurological disease.  Allergies TraMADol HCl *ANALGESICS - OPIOID*. Kept patient awake.  Medications albuterol (PROVENTIL,VENTOLIN) 90 MCG/ACT inhaler ALPRAZolam (XANAX) 1 MG tablet Cholecalciferol (VITAMIN D) 1000 UNITS capsule Multiple Vitamin (MULITIVITAMIN WITH MINERALS) TABS  Problem List/Past Medical  Shingles Irritable bowel syndrome Diverticulosis Hyperthyroidism Hypercholesterolemia. Hyperlipidemia Anemia Vitamin D deficiency (268.9) Anxiety Disorder Depression Colitis (558.9) UTI (urinary tract infection) (599.0) Palpitations (785.1) Lymphedema (457.1) Emphysema (492.8) Chicken pox (052.9) Measles H. pylori infection (041.86) Urticaria (708.9) Postherpetic neuralgia (053.19)  Past Surgical History  Appendectomy Breast Biopsy. bilateral, multiple times (all benign) Hysterectomy. complete (non-cancerous)  Family History  Cancer. sister Rheumatoid Arthritis. mother Father. History of Blood Clot  Social History Alcohol use. never consumed alcohol Children. 2 Current work status. retired Drug/Alcohol Rehab (Currently). no Exercise. Exercises rarely; does other Illicit drug use. no Living situation. live with spouse Marital status. married Number of flights of stairs before winded. 4-5 Pain Contract. no Previously in rehab. no Tobacco use. Never smoker. never smoker Post-Surgical Plans. Plan is for [facility]  Review of Systems  General:Present- Weight Loss. Not Present- Chills, Fever, Night Sweats, Appetite Loss, Fatigue, Feeling sick and Weight Gain. Skin:Not Present- Itching, Rash, Skin Color Changes, Ulcer, Psoriasis and Change in Hair or Nails. HEENT:Not Present- Sensitivity to light, Nose Bleed, Visual Loss, Decreased Hearing and Ringing  in the Ears. Neck:Not Present- Swollen Glands and Neck Mass. Respiratory:Not Present- Shortness of breath,   Snoring, Chronic Cough and Bloody sputum. Cardiovascular:Present- Shortness of Breath. Not Present- Chest Pain, Swelling of Extremities, Leg Cramps and Palpitations. Gastrointestinal:Not Present- Bloody Stool, Heartburn, Abdominal Pain, Vomiting, Nausea and Incontinence of Stool. Female Genitourinary:Not Present- Blood in Urine, Irregular/missing periods, Frequency, Incontinence and Nocturia. Musculoskeletal:Present- Muscle Weakness, Muscle Pain, Joint Stiffness, Joint Swelling, Joint Pain and Back Pain. Neurological:Not Present- Tingling, Numbness, Burning, Tremor, Headaches and Dizziness. Psychiatric:Not Present- Anxiety, Depression and Memory Loss. Endocrine:Not Present- Cold Intolerance, Heat Intolerance, Excessive hunger and Excessive Thirst. Hematology:Not Present- Abnormal Bleeding, Abnormal bruising, Anemia and Blood Clots.  Vitals Weight: 91 lb Height: 62 in Body Surface Area: 1.34 m Body Mass Index: 16.64 kg/m Pulse: 84 (Regular) Resp.: 14 (Unlabored) BP: 124/64 (Sitting, Left Arm, Standard)  Physical Exam  The physical exam findings are as follows: Patient is a 70 year old female with continued hip pain Patient is accompanied today by her daughter [name].  General Mental Status - Alert, cooperative and good historian. General Appearance- pleasant. Not in acute distress. Orientation- Oriented X3. Build & Nutrition- Cachectic (mild), Petite and Well developed.  Head and Neck Head- normocephalic, atraumatic . Neck Global Assessment- supple. no bruit auscultated on the right and no bruit auscultated on the left.  Eye Pupil- Bilateral- Regular and Round. Note: wears glasses Motion- Bilateral- EOMI.  Chest and Lung Exam Auscultation: Breath sounds:- clear at anterior chest wall and - clear at posterior chest wall. Adventitious  sounds:- No Adventitious sounds.  Cardiovascular Auscultation:Rhythm- Regular rate and rhythm. Heart Sounds- S1 WNL and S2 WNL. Murmurs & Other Heart Sounds:Auscultation of the heart reveals - No Murmurs.  Abdomen Palpation/Percussion:Tenderness- Abdomen is non-tender to palpation. Rigidity (guarding)- Abdomen is soft. Auscultation:Auscultation of the abdomen reveals - Bowel sounds normal.  Female Genitourinary Not done, not pertinent to present illness  Musculoskeletal On exam, she is alert and oriented in no apparent distress. Her right hip can be flexed to 70 with 0 internal and 0 external rotation and no abduction. Knee exam is normal. She is nontender over the trochanter.  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 Aluisio.  Plan is to go to [facility] after the hospital stay. Patient already has an outpatient case worker, Alisa Gilboy, with [facility].  Cards - Dr. Tom Brackbill  Drew Lamica Mccart, PA-C  

## 2011-10-01 ENCOUNTER — Inpatient Hospital Stay (HOSPITAL_COMMUNITY): Payer: Medicare Other

## 2011-10-01 ENCOUNTER — Encounter (HOSPITAL_COMMUNITY): Payer: Self-pay | Admitting: *Deleted

## 2011-10-01 ENCOUNTER — Encounter (HOSPITAL_COMMUNITY)
Admission: RE | Disposition: A | Discharge status: Skilled Nursing Facility | Payer: Self-pay | Source: Ambulatory Visit | Attending: Orthopedic Surgery

## 2011-10-01 ENCOUNTER — Inpatient Hospital Stay (HOSPITAL_COMMUNITY): Payer: Medicare Other | Admitting: *Deleted

## 2011-10-01 ENCOUNTER — Inpatient Hospital Stay (HOSPITAL_COMMUNITY)
Admission: RE | Admit: 2011-10-01 | Discharge: 2011-10-04 | DRG: 470 | Disposition: A | Discharge status: Skilled Nursing Facility | Payer: Medicare Other | Source: Ambulatory Visit | Attending: Orthopedic Surgery | Admitting: Orthopedic Surgery

## 2011-10-01 DIAGNOSIS — M169 Osteoarthritis of hip, unspecified: Secondary | ICD-10-CM | POA: Diagnosis present

## 2011-10-01 DIAGNOSIS — F329 Major depressive disorder, single episode, unspecified: Secondary | ICD-10-CM | POA: Diagnosis present

## 2011-10-01 DIAGNOSIS — Z681 Body mass index (BMI) 19 or less, adult: Secondary | ICD-10-CM

## 2011-10-01 DIAGNOSIS — D62 Acute posthemorrhagic anemia: Secondary | ICD-10-CM | POA: Diagnosis not present

## 2011-10-01 DIAGNOSIS — M1710 Unilateral primary osteoarthritis, unspecified knee: Secondary | ICD-10-CM | POA: Insufficient documentation

## 2011-10-01 DIAGNOSIS — Z96649 Presence of unspecified artificial hip joint: Secondary | ICD-10-CM

## 2011-10-01 DIAGNOSIS — J4489 Other specified chronic obstructive pulmonary disease: Secondary | ICD-10-CM | POA: Diagnosis present

## 2011-10-01 DIAGNOSIS — E871 Hypo-osmolality and hyponatremia: Secondary | ICD-10-CM | POA: Diagnosis not present

## 2011-10-01 DIAGNOSIS — E876 Hypokalemia: Secondary | ICD-10-CM | POA: Diagnosis not present

## 2011-10-01 DIAGNOSIS — M1610 Unilateral primary osteoarthritis, unspecified hip: Principal | ICD-10-CM | POA: Diagnosis present

## 2011-10-01 DIAGNOSIS — R64 Cachexia: Secondary | ICD-10-CM | POA: Diagnosis present

## 2011-10-01 DIAGNOSIS — F3289 Other specified depressive episodes: Secondary | ICD-10-CM | POA: Diagnosis present

## 2011-10-01 DIAGNOSIS — J449 Chronic obstructive pulmonary disease, unspecified: Secondary | ICD-10-CM | POA: Diagnosis present

## 2011-10-01 HISTORY — PX: TOTAL HIP ARTHROPLASTY: SHX124

## 2011-10-01 LAB — TYPE AND SCREEN
ABO/RH(D): O POS
Antibody Screen: NEGATIVE

## 2011-10-01 LAB — ABO/RH: ABO/RH(D): O POS

## 2011-10-01 SURGERY — ARTHROPLASTY, HIP, TOTAL,POSTERIOR APPROACH
Anesthesia: General | Site: Hip | Laterality: Right | Wound class: Clean

## 2011-10-01 MED ORDER — DROPERIDOL 2.5 MG/ML IJ SOLN: 0.6250 mg | INTRAMUSCULAR | Status: DC | PRN

## 2011-10-01 MED ORDER — METOCLOPRAMIDE HCL 10 MG PO TABS
5.0000 mg | ORAL_TABLET | Freq: Three times a day (TID) | ORAL | Status: DC | PRN
  Administered 2011-10-03 (×2): 10 mg via ORAL
  Filled 2011-10-01 (×2): qty 1

## 2011-10-01 MED ORDER — LIDOCAINE HCL (CARDIAC) 20 MG/ML IV SOLN
INTRAVENOUS | Status: DC | PRN
  Administered 2011-10-01: 80 mg via INTRAVENOUS

## 2011-10-01 MED ORDER — DEXAMETHASONE SODIUM PHOSPHATE 10 MG/ML IJ SOLN
INTRAMUSCULAR | Status: DC | PRN
  Administered 2011-10-01: 10 mg via INTRAVENOUS

## 2011-10-01 MED ORDER — METHOCARBAMOL 100 MG/ML IJ SOLN
500.0000 mg | Freq: Four times a day (QID) | INTRAVENOUS | Status: DC | PRN
  Administered 2011-10-01: 500 mg via INTRAVENOUS
  Filled 2011-10-01: qty 5

## 2011-10-01 MED ORDER — ALBUTEROL SULFATE HFA 108 (90 BASE) MCG/ACT IN AERS
2.0000 | INHALATION_SPRAY | Freq: Four times a day (QID) | RESPIRATORY_TRACT | Status: DC | PRN
  Filled 2011-10-01: qty 6.7

## 2011-10-01 MED ORDER — MORPHINE SULFATE 2 MG/ML IJ SOLN
1.0000 mg | INTRAMUSCULAR | Status: DC | PRN
  Administered 2011-10-01 (×2): 1 mg via INTRAVENOUS
  Filled 2011-10-01 (×2): qty 1

## 2011-10-01 MED ORDER — CEFAZOLIN SODIUM 1-5 GM-% IV SOLN
1.0000 g | INTRAVENOUS | Status: AC
  Administered 2011-10-01: 1 g via INTRAVENOUS

## 2011-10-01 MED ORDER — NEOSTIGMINE METHYLSULFATE 1 MG/ML IJ SOLN
INTRAMUSCULAR | Status: DC | PRN
  Administered 2011-10-01: 3 mg via INTRAVENOUS

## 2011-10-01 MED ORDER — CEFAZOLIN SODIUM 1-5 GM-% IV SOLN
1.0000 g | Freq: Four times a day (QID) | INTRAVENOUS | Status: AC
  Administered 2011-10-01 – 2011-10-02 (×3): 1 g via INTRAVENOUS
  Filled 2011-10-01 (×3): qty 50

## 2011-10-01 MED ORDER — BUPIVACAINE LIPOSOME 1.3 % IJ SUSP
INTRAMUSCULAR | Status: DC | PRN
  Administered 2011-10-01: 20 mL

## 2011-10-01 MED ORDER — DEXTROSE-NACL 5-0.9 % IV SOLN
INTRAVENOUS | Status: DC
  Administered 2011-10-01 – 2011-10-02 (×2): via INTRAVENOUS

## 2011-10-01 MED ORDER — SODIUM CHLORIDE 0.9 % IV SOLN: INTRAVENOUS | Status: DC

## 2011-10-01 MED ORDER — ACETAMINOPHEN 325 MG PO TABS
650.0000 mg | ORAL_TABLET | Freq: Four times a day (QID) | ORAL | Status: DC | PRN
  Administered 2011-10-04: 650 mg via ORAL
  Filled 2011-10-01: qty 2

## 2011-10-01 MED ORDER — FENTANYL CITRATE 0.05 MG/ML IJ SOLN
INTRAMUSCULAR | Status: DC | PRN
  Administered 2011-10-01: 50 ug via INTRAVENOUS
  Administered 2011-10-01: 100 ug via INTRAVENOUS
  Administered 2011-10-01 (×2): 50 ug via INTRAVENOUS

## 2011-10-01 MED ORDER — BUPIVACAINE LIPOSOME 1.3 % IJ SUSP
20.0000 mL | Freq: Once | INTRAMUSCULAR | Status: DC
  Filled 2011-10-01: qty 20

## 2011-10-01 MED ORDER — TEMAZEPAM 15 MG PO CAPS: 15.0000 mg | ORAL_CAPSULE | Freq: Every evening | ORAL | Status: DC | PRN

## 2011-10-01 MED ORDER — ACETAMINOPHEN 10 MG/ML IV SOLN
INTRAVENOUS | Status: DC | PRN
  Administered 2011-10-01: 1000 mg via INTRAVENOUS

## 2011-10-01 MED ORDER — BISACODYL 10 MG RE SUPP: 10.0000 mg | Freq: Every day | RECTAL | Status: DC | PRN

## 2011-10-01 MED ORDER — MEPERIDINE HCL 50 MG/ML IJ SOLN: 6.2500 mg | INTRAMUSCULAR | Status: DC | PRN

## 2011-10-01 MED ORDER — ACETAMINOPHEN 10 MG/ML IV SOLN: 1000.0000 mg | Freq: Once | INTRAVENOUS | Status: DC

## 2011-10-01 MED ORDER — MIDAZOLAM HCL 5 MG/5ML IJ SOLN
INTRAMUSCULAR | Status: DC | PRN
  Administered 2011-10-01: 2 mg via INTRAVENOUS

## 2011-10-01 MED ORDER — ALBUTEROL 90 MCG/ACT IN AERS: 2.0000 | INHALATION_SPRAY | Freq: Four times a day (QID) | RESPIRATORY_TRACT | Status: DC | PRN

## 2011-10-01 MED ORDER — DIPHENHYDRAMINE HCL 12.5 MG/5ML PO ELIX: 12.5000 mg | ORAL_SOLUTION | ORAL | Status: DC | PRN

## 2011-10-01 MED ORDER — ACETAMINOPHEN 650 MG RE SUPP: 650.0000 mg | Freq: Four times a day (QID) | RECTAL | Status: DC | PRN

## 2011-10-01 MED ORDER — LACTATED RINGERS IV SOLN: INTRAVENOUS | Status: DC

## 2011-10-01 MED ORDER — ONDANSETRON HCL 4 MG/2ML IJ SOLN
4.0000 mg | Freq: Four times a day (QID) | INTRAMUSCULAR | Status: DC | PRN
  Administered 2011-10-02: 4 mg via INTRAVENOUS
  Filled 2011-10-01: qty 2

## 2011-10-01 MED ORDER — METHOCARBAMOL 500 MG PO TABS
500.0000 mg | ORAL_TABLET | Freq: Four times a day (QID) | ORAL | Status: DC | PRN
  Administered 2011-10-02 – 2011-10-04 (×4): 500 mg via ORAL
  Filled 2011-10-01 (×4): qty 1

## 2011-10-01 MED ORDER — OXYCODONE HCL 5 MG PO TABS
5.0000 mg | ORAL_TABLET | ORAL | Status: DC | PRN
  Administered 2011-10-02 (×2): 10 mg via ORAL
  Administered 2011-10-02: 5 mg via ORAL
  Administered 2011-10-02 (×2): 10 mg via ORAL
  Filled 2011-10-01 (×3): qty 2
  Filled 2011-10-01: qty 1
  Filled 2011-10-01: qty 2

## 2011-10-01 MED ORDER — ALPRAZOLAM 1 MG PO TABS
1.0000 mg | ORAL_TABLET | Freq: Two times a day (BID) | ORAL | Status: DC
  Administered 2011-10-01 – 2011-10-04 (×6): 1 mg via ORAL
  Filled 2011-10-01 (×6): qty 1

## 2011-10-01 MED ORDER — ONDANSETRON HCL 4 MG/2ML IJ SOLN
INTRAMUSCULAR | Status: DC | PRN
  Administered 2011-10-01: 4 mg via INTRAVENOUS

## 2011-10-01 MED ORDER — POLYETHYLENE GLYCOL 3350 17 G PO PACK: 17.0000 g | PACK | Freq: Every day | ORAL | Status: DC | PRN

## 2011-10-01 MED ORDER — 0.9 % SODIUM CHLORIDE (POUR BTL) OPTIME
TOPICAL | Status: DC | PRN
  Administered 2011-10-01: 1000 mL

## 2011-10-01 MED ORDER — SODIUM CHLORIDE 0.9 % IJ SOLN
INTRAMUSCULAR | Status: DC | PRN
  Administered 2011-10-01: 50 mL

## 2011-10-01 MED ORDER — GLYCOPYRROLATE 0.2 MG/ML IJ SOLN
INTRAMUSCULAR | Status: DC | PRN
  Administered 2011-10-01: .4 mg via INTRAVENOUS

## 2011-10-01 MED ORDER — LACTATED RINGERS IV SOLN
INTRAVENOUS | Status: DC
  Administered 2011-10-01 (×2): via INTRAVENOUS
  Administered 2011-10-01: 1000 mL via INTRAVENOUS

## 2011-10-01 MED ORDER — ACETAMINOPHEN 10 MG/ML IV SOLN
1000.0000 mg | Freq: Four times a day (QID) | INTRAVENOUS | Status: AC
  Administered 2011-10-01 – 2011-10-02 (×4): 1000 mg via INTRAVENOUS
  Filled 2011-10-01 (×5): qty 100

## 2011-10-01 MED ORDER — MENTHOL 3 MG MT LOZG: 1.0000 | LOZENGE | OROMUCOSAL | Status: DC | PRN

## 2011-10-01 MED ORDER — DEXAMETHASONE SODIUM PHOSPHATE 10 MG/ML IJ SOLN
10.0000 mg | Freq: Once | INTRAMUSCULAR | Status: DC
Start: 2011-10-01 — End: 2011-10-01

## 2011-10-01 MED ORDER — PROPOFOL 10 MG/ML IV BOLUS
INTRAVENOUS | Status: DC | PRN
  Administered 2011-10-01: 35 mg via INTRAVENOUS
  Administered 2011-10-01: 150 mg via INTRAVENOUS

## 2011-10-01 MED ORDER — ONDANSETRON HCL 4 MG PO TABS: 4.0000 mg | ORAL_TABLET | Freq: Four times a day (QID) | ORAL | Status: DC | PRN

## 2011-10-01 MED ORDER — FLEET ENEMA 7-19 GM/118ML RE ENEM: 1.0000 | ENEMA | Freq: Once | RECTAL | Status: AC | PRN

## 2011-10-01 MED ORDER — DOCUSATE SODIUM 100 MG PO CAPS
100.0000 mg | ORAL_CAPSULE | Freq: Two times a day (BID) | ORAL | Status: DC
Start: 2011-10-01 — End: 2011-10-04
  Administered 2011-10-01 – 2011-10-04 (×6): 100 mg via ORAL
  Filled 2011-10-01 (×6): qty 1

## 2011-10-01 MED ORDER — RIVAROXABAN 10 MG PO TABS
10.0000 mg | ORAL_TABLET | Freq: Every day | ORAL | Status: DC
  Administered 2011-10-02 – 2011-10-04 (×3): 10 mg via ORAL
  Filled 2011-10-01 (×3): qty 1

## 2011-10-01 MED ORDER — HYDROMORPHONE HCL PF 1 MG/ML IJ SOLN
0.2500 mg | INTRAMUSCULAR | Status: DC | PRN
Start: 2011-10-01 — End: 2011-10-01
  Administered 2011-10-01 (×2): 0.5 mg via INTRAVENOUS

## 2011-10-01 MED ORDER — METOCLOPRAMIDE HCL 5 MG/ML IJ SOLN
5.0000 mg | Freq: Three times a day (TID) | INTRAMUSCULAR | Status: DC | PRN
  Administered 2011-10-02: 10 mg via INTRAVENOUS
  Filled 2011-10-01: qty 2

## 2011-10-01 MED ORDER — PHENOL 1.4 % MT LIQD: 1.0000 | OROMUCOSAL | Status: DC | PRN

## 2011-10-01 SURGICAL SUPPLY — 50 items
BAG SPEC THK2 15X12 ZIP CLS (MISCELLANEOUS) ×1
BAG ZIPLOCK 12X15 (MISCELLANEOUS) ×2 IMPLANT
BIT DRILL 2.8X128 (BIT) ×2 IMPLANT
BLADE EXTENDED COATED 6.5IN (ELECTRODE) ×2 IMPLANT
BLADE SAW SAG 73X25 THK (BLADE) ×1
BLADE SAW SGTL 73X25 THK (BLADE) ×1 IMPLANT
CLOTH BEACON ORANGE TIMEOUT ST (SAFETY) ×2 IMPLANT
DECANTER SPIKE VIAL GLASS SM (MISCELLANEOUS) ×2 IMPLANT
DRAPE INCISE IOBAN 66X45 STRL (DRAPES) ×2 IMPLANT
DRAPE ORTHO SPLIT 77X108 STRL (DRAPES) ×4
DRAPE POUCH INSTRU U-SHP 10X18 (DRAPES) ×2 IMPLANT
DRAPE SURG ORHT 6 SPLT 77X108 (DRAPES) ×2 IMPLANT
DRAPE U-SHAPE 47X51 STRL (DRAPES) ×2 IMPLANT
DRSG ADAPTIC 3X8 NADH LF (GAUZE/BANDAGES/DRESSINGS) ×2 IMPLANT
DRSG MEPILEX BORDER 4X4 (GAUZE/BANDAGES/DRESSINGS) ×2 IMPLANT
DRSG MEPILEX BORDER 4X8 (GAUZE/BANDAGES/DRESSINGS) ×2 IMPLANT
DURAPREP 26ML APPLICATOR (WOUND CARE) ×2 IMPLANT
ELECT REM PT RETURN 9FT ADLT (ELECTROSURGICAL) ×2
ELECTRODE REM PT RTRN 9FT ADLT (ELECTROSURGICAL) ×1 IMPLANT
EVACUATOR 1/8 PVC DRAIN (DRAIN) ×2 IMPLANT
FACESHIELD LNG OPTICON STERILE (SAFETY) ×8 IMPLANT
GLOVE BIO SURGEON STRL SZ7.5 (GLOVE) ×2 IMPLANT
GLOVE BIO SURGEON STRL SZ8 (GLOVE) ×2 IMPLANT
GLOVE BIOGEL PI IND STRL 8 (GLOVE) ×2 IMPLANT
GLOVE BIOGEL PI INDICATOR 8 (GLOVE) ×2
GOWN STRL NON-REIN LRG LVL3 (GOWN DISPOSABLE) ×2 IMPLANT
GOWN STRL REIN XL XLG (GOWN DISPOSABLE) ×2 IMPLANT
IMMOBILIZER KNEE 20 (SOFTGOODS)
IMMOBILIZER KNEE 20 THIGH 36 (SOFTGOODS) IMPLANT
KIT BASIN OR (CUSTOM PROCEDURE TRAY) ×2 IMPLANT
MANIFOLD NEPTUNE II (INSTRUMENTS) ×2 IMPLANT
NDL SAFETY ECLIPSE 18X1.5 (NEEDLE) ×1 IMPLANT
NEEDLE HYPO 18GX1.5 SHARP (NEEDLE) ×2
NS IRRIG 1000ML POUR BTL (IV SOLUTION) ×2 IMPLANT
PACK TOTAL JOINT (CUSTOM PROCEDURE TRAY) ×2 IMPLANT
PASSER SUT SWANSON 36MM LOOP (INSTRUMENTS) ×2 IMPLANT
POSITIONER SURGICAL ARM (MISCELLANEOUS) ×2 IMPLANT
SPONGE GAUZE 4X4 12PLY (GAUZE/BANDAGES/DRESSINGS) ×2 IMPLANT
STRIP CLOSURE SKIN 1/2X4 (GAUZE/BANDAGES/DRESSINGS) ×4 IMPLANT
SUT ETHIBOND NAB CT1 #1 30IN (SUTURE) ×4 IMPLANT
SUT MNCRL AB 4-0 PS2 18 (SUTURE) ×2 IMPLANT
SUT VIC AB 1 CT1 27 (SUTURE) ×6
SUT VIC AB 1 CT1 27XBRD ANTBC (SUTURE) ×3 IMPLANT
SUT VIC AB 2-0 CT1 27 (SUTURE) ×6
SUT VIC AB 2-0 CT1 TAPERPNT 27 (SUTURE) ×3 IMPLANT
SYR 50ML LL SCALE MARK (SYRINGE) ×2 IMPLANT
TOWEL OR 17X26 10 PK STRL BLUE (TOWEL DISPOSABLE) ×4 IMPLANT
TOWEL OR NON WOVEN STRL DISP B (DISPOSABLE) ×2 IMPLANT
TRAY FOLEY CATH 14FRSI W/METER (CATHETERS) ×2 IMPLANT
WATER STERILE IRR 1500ML POUR (IV SOLUTION) ×2 IMPLANT

## 2011-10-01 NOTE — Transfer of Care (Signed)
Immediate Anesthesia Transfer of Care Note  Patient: [name]  Procedure(s) Performed: Procedure(s) (LRB): TOTAL HIP ARTHROPLASTY (Right)  Patient Location: PACU  Anesthesia Type: General  Level of Consciousness: awake and alert   Airway & Oxygen Therapy: Patient Spontanous Breathing and Patient connected to face mask oxygen  Post-op Assessment: Report given to PACU RN and Post -op Vital signs reviewed and stable  Post vital signs: Reviewed and stable  Complications: No apparent anesthesia complications

## 2011-10-01 NOTE — Op Note (Signed)
Pre-operative diagnosis- Osteoarthritis Right hip  Post-operative diagnosis- Osteoarthritis  Right hip  Procedure-  RightTotal Hip Arthroplasty  Surgeon- Gus Rankin. Jayvier Burgher, MD  Assistant- Avel Peace, PA-C   Anesthesia  General  EBL- 350   Drain Hemovac   Complication- None  Condition-PACU - hemodynamically stable.   Brief Clinical Note-  [name] is a 71 y.o. female with end stage arthritis of her right hip with progressively worsening pain and dysfunction. Pain occurs with activity and rest including pain at night. She has tried analgesics, protected weight bearing and rest without benefit. Pain is too severe to attempt physical therapy. Radiographs demonstrate bone on bone arthritis with subchondral cyst formation. She presents now for right THA.  Procedure in detail-   The patient is brought into the operating room and placed on the operating table. After successful administration of General  anesthesia, the patient is placed in the  Left lateral decubitus position with the  Right side up and held in place with the hip positioner. The lower extremity is isolated from the perineum with plastic drapes and time-out is performed by the surgical team. The lower extremity is then prepped and draped in the usual sterile fashion. A short posterolateral incision is made with a ten blade through the subcutaneous tissue to the level of the fascia lata which is incised in line with the skin incision. The sciatic nerve is palpated and protected and the short external rotators and capsule are isolated from the femur. The hip is then dislocated and the center of the femoral head is marked. A trial prosthesis is placed such that the trial head corresponds to the center of the patients' native femoral head. The resection level is marked on the femoral neck and the resection is made with an oscillating saw. The femoral head is removed and femoral retractors placed to gain access to the femoral canal.      The canal finder is passed into the femoral canal and the canal is thoroughly irrigated with sterile saline to remove the fatty contents. Axial reaming is performed to 13.5  mm, proximal reaming to 42F  and the sleeve machined to a small. A 42F small trial sleeve is placed into the proximal femur.      The femur is then retracted anteriorly to gain acetabular exposure. Acetabular retractors are placed and the labrum and osteophytes are removed, Acetabular reaming is performed to 49  mm and a 50  mm Pinnacle acetabular shell is placed in anatomic position with excellent purchase. Additional dome screws were not needed. An apex hole eliminator is placed and the permanent 32 mm neutral + 4 Marathon liner is placed into the acetabular shell.      The trial femur is then placed into the femoral canal. The size is 18 x 13  stem with a 36 + 8  neck and a 32 + 3  head with the neck version 10 degrees beyond  the patients' native anteversion. The hip is reduced with excellent stability with full extension and full external rotation, 70 degrees flexion with 40 degrees adduction and 90 degrees internal rotation and 90 degrees of flexion with 70 degrees of internal rotation. The operative leg is placed on top of the non-operative leg and the leg lengths are found to be equal. The trials are then removed and the permanent implant of the same size is impacted into the femoral canal. The ceramic femoral head of the same size as the trial is placed and the  hip is reduced with the same stability parameters. The operative leg is again placed on top of the non-operative leg and the leg lengths are found to be equal.      The wound is then copiously irrigated with saline solution and the capsule and short external rotators are re-attached to the femur through drill holes with Ethibond suture. The fascia lata is closed over a hemovac drain with #1 vicryl suture and the fascia lata, gluteal muscles and subcutaneous tissues are  injected with Exparel 20ml diluted with saline 50ml. The subcutaneous tissues are closed with #1 and2-0 vicryl and the subcuticular layer closed with running 4-0 Monocryl. The drain is hooked to suction, incision cleaned and dried, and steri-srips and a bulky sterile dressing applied. The limb is placed into a knee immobilizer and the patient is awakened and transported to recovery in stable condition.      Please note that a surgical assistant was a medical necessity for this procedure in order to perform it in a safe and expeditious manner. The assistant was necessary to provide retraction to the vital neurovascular structures and to retract and position the limb to allow for anatomic placement of the prosthetic components.  Gus Rankin Arneda Sappington, MD    10/01/2011, 2:11 PM

## 2011-10-01 NOTE — Anesthesia Postprocedure Evaluation (Signed)
  Anesthesia Post-op Note  Patient: [name]  Procedure(s) Performed: Procedure(s) (LRB): TOTAL HIP ARTHROPLASTY (Right)  Patient Location: PACU  Anesthesia Type: General  Level of Consciousness: awake and alert   Airway and Oxygen Therapy: Patient Spontanous Breathing  Post-op Pain: mild  Post-op Assessment: Post-op Vital signs reviewed, Patient's Cardiovascular Status Stable, Respiratory Function Stable, Patent Airway and No signs of Nausea or vomiting  Post-op Vital Signs: stable  Complications: No apparent anesthesia complications

## 2011-10-01 NOTE — Anesthesia Preprocedure Evaluation (Signed)
Anesthesia Evaluation  Patient identified by MRN, date of birth, ID band Patient awake    Reviewed: Allergy & Precautions, H&P , NPO status , Patient's Chart, lab work & pertinent test results  Airway Mallampati: II TM Distance: >3 FB Neck ROM: Full    Dental No notable dental hx.    Pulmonary neg pulmonary ROS, COPD breath sounds clear to auscultation  Pulmonary exam normal       Cardiovascular negative cardio ROS  Rhythm:Regular Rate:Normal     Neuro/Psych PSYCHIATRIC DISORDERS Depression  Neuromuscular disease negative neurological ROS  negative psych ROS   GI/Hepatic negative GI ROS, Neg liver ROS,   Endo/Other  negative endocrine ROSHyperthyroidism   Renal/GU negative Renal ROS  negative genitourinary   Musculoskeletal negative musculoskeletal ROS (+)   Abdominal   Peds negative pediatric ROS (+)  Hematology negative hematology ROS (+)   Anesthesia Other Findings   Reproductive/Obstetrics negative OB ROS                           Anesthesia Physical Anesthesia Plan  ASA: III  Anesthesia Plan: General   Post-op Pain Management:    Induction: Intravenous  Airway Management Planned: Oral ETT  Additional Equipment:   Intra-op Plan:   Post-operative Plan: Extubation in OR  Informed Consent: I have reviewed the patients History and Physical, chart, labs and discussed the procedure including the risks, benefits and alternatives for the proposed anesthesia with the patient or authorized representative who has indicated his/her understanding and acceptance.   Dental advisory given  Plan Discussed with: CRNA  Anesthesia Plan Comments:         Anesthesia Quick Evaluation

## 2011-10-01 NOTE — Interval H&P Note (Signed)
History and Physical Interval Note:  10/01/2011 12:35 PM  [name]  has presented today for surgery, with the diagnosis of Osteoarthritis of the Right Hip  The various methods of treatment have been discussed with the patient and family. After consideration of risks, benefits and other options for treatment, the patient has consented to  Procedure(s) (LRB): TOTAL HIP ARTHROPLASTY (Right) as a surgical intervention .  The patients' history has been reviewed, patient examined, no change in status, stable for surgery.  I have reviewed the patients' chart and labs.  Questions were answered to the patient's satisfaction.     Loanne Drilling

## 2011-10-01 NOTE — H&P (View-Only) (Signed)
[name]  DOB: [birth year] Married / Female  Date of Admission:  10/01/2011  Chief Complaint:    History of Present Illness  The patient is a 71 year old female who comes in  for a preoperative History and Physical. The patient is scheduled for a right total hip arthroplasty to be performed by Dr. Gus Rankin. Aluisio, MD at [facility]. The patient is a 71 year old female who presents for their right hip pain. They have had an IA injection on 04-23-2011. Symptoms reported today include: pain. The patient feels that they are doing poorly (Patient states that she hurts all the time and is having trouble sleeping at night. Patient is taking Aleve and sometimes a Tylenol.). Ms. Hajjar states that since we saw her last spring, her hip has gotten progressively worse. The initial intraarticular injection last spring did help quite a bit but then her pain recurred significantly and she had a second intraarticular injection at the end of September. Unfortunately, that did not provide much benefit. She feels like the hip is getting progressively worse. It is hurting at all times. It is limiting what she can and cannot do. She has pain at night. She said she has essentially no mobility in the hip anymore and is very scared and fearful of falling. Her husband [name], on whom I have done knee replacements, unfortunately is not doing well from a health standpoint. She states she is having a very hard time caring for him due to her hip problem. She is ready to proceed with hip replacement in March. They have been treated conservatively in the past for the above stated problem and despite conservative measures, they continue to have progressive pain and severe functional limitations and dysfunction. They have failed non-operative management including home exercise, medications, and injections. It is felt that they would benefit from undergoing total joint replacement. Risks and benefits of the  procedure have been discussed with the patient and they elect to proceed with surgery. There are no active contraindications to surgery such as ongoing infection or rapidly progressive neurological disease.  Allergies TraMADol HCl *ANALGESICS - OPIOID*. Kept patient awake.  Medications albuterol (PROVENTIL,VENTOLIN) 90 MCG/ACT inhaler ALPRAZolam (XANAX) 1 MG tablet Cholecalciferol (VITAMIN D) 1000 UNITS capsule Multiple Vitamin (MULITIVITAMIN WITH MINERALS) TABS  Problem List/Past Medical  Shingles Irritable bowel syndrome Diverticulosis Hyperthyroidism Hypercholesterolemia. Hyperlipidemia Anemia Vitamin D deficiency (268.9) Anxiety Disorder Depression Colitis (558.9) UTI (urinary tract infection) (599.0) Palpitations (785.1) Lymphedema (457.1) Emphysema (492.8) Chicken pox (052.9) Measles H. pylori infection (041.86) Urticaria (708.9) Postherpetic neuralgia (053.19)  Past Surgical History  Appendectomy Breast Biopsy. bilateral, multiple times (all benign) Hysterectomy. complete (non-cancerous)  Family History  Cancer. sister Rheumatoid Arthritis. mother Father. History of Blood Clot  Social History Alcohol use. never consumed alcohol Children. 2 Current work status. retired Financial planner (Currently). no Exercise. Exercises rarely; does other Illicit drug use. no Living situation. live with spouse Marital status. married Number of flights of stairs before winded. 4-5 Pain Contract. no Previously in rehab. no Tobacco use. Never smoker. never smoker Post-Surgical Plans. Plan is for [facility]  Review of Systems  General:Present- Weight Loss. Not Present- Chills, Fever, Night Sweats, Appetite Loss, Fatigue, Feeling sick and Weight Gain. Skin:Not Present- Itching, Rash, Skin Color Changes, Ulcer, Psoriasis and Change in Hair or Nails. HEENT:Not Present- Sensitivity to light, Nose Bleed, Visual Loss, Decreased Hearing and Ringing  in the Ears. Neck:Not Present- Swollen Glands and Neck Mass. Respiratory:Not Present- Shortness of breath,  Snoring, Chronic Cough and Bloody sputum. Cardiovascular:Present- Shortness of Breath. Not Present- Chest Pain, Swelling of Extremities, Leg Cramps and Palpitations. Gastrointestinal:Not Present- Bloody Stool, Heartburn, Abdominal Pain, Vomiting, Nausea and Incontinence of Stool. Female Genitourinary:Not Present- Blood in Urine, Irregular/missing periods, Frequency, Incontinence and Nocturia. Musculoskeletal:Present- Muscle Weakness, Muscle Pain, Joint Stiffness, Joint Swelling, Joint Pain and Back Pain. Neurological:Not Present- Tingling, Numbness, Burning, Tremor, Headaches and Dizziness. Psychiatric:Not Present- Anxiety, Depression and Memory Loss. Endocrine:Not Present- Cold Intolerance, Heat Intolerance, Excessive hunger and Excessive Thirst. Hematology:Not Present- Abnormal Bleeding, Abnormal bruising, Anemia and Blood Clots.  Vitals Weight: 91 lb Height: 62 in Body Surface Area: 1.34 m Body Mass Index: 16.64 kg/m Pulse: 84 (Regular) Resp.: 14 (Unlabored) BP: 124/64 (Sitting, Left Arm, Standard)  Physical Exam  The physical exam findings are as follows: Patient is a 71 year old female with continued hip pain Patient is accompanied today by her daughter [name].  General Mental Status - Alert, cooperative and good historian. General Appearance- pleasant. Not in acute distress. Orientation- Oriented X3. Build & Nutrition- Cachectic (mild), Petite and Well developed.  Head and Neck Head- normocephalic, atraumatic . Neck Global Assessment- supple. no bruit auscultated on the right and no bruit auscultated on the left.  Eye Pupil- Bilateral- Regular and Round. Note: wears glasses Motion- Bilateral- EOMI.  Chest and Lung Exam Auscultation: Breath sounds:- clear at anterior chest wall and - clear at posterior chest wall. Adventitious  sounds:- No Adventitious sounds.  Cardiovascular Auscultation:Rhythm- Regular rate and rhythm. Heart Sounds- S1 WNL and S2 WNL. Murmurs & Other Heart Sounds:Auscultation of the heart reveals - No Murmurs.  Abdomen Palpation/Percussion:Tenderness- Abdomen is non-tender to palpation. Rigidity (guarding)- Abdomen is soft. Auscultation:Auscultation of the abdomen reveals - Bowel sounds normal.  Female Genitourinary Not done, not pertinent to present illness  Musculoskeletal On exam, she is alert and oriented in no apparent distress. Her right hip can be flexed to 70 with 0 internal and 0 external rotation and no abduction. Knee exam is normal. She is nontender over the trochanter.  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 Lequita Halt.  Plan is to go to [facility] after the hospital stay. Patient already has an outpatient case worker, [name], with [facility].  Cards - Dr. Ronny Hill  Avel Peace, PA-C

## 2011-10-02 DIAGNOSIS — D62 Acute posthemorrhagic anemia: Secondary | ICD-10-CM | POA: Diagnosis not present

## 2011-10-02 DIAGNOSIS — E871 Hypo-osmolality and hyponatremia: Secondary | ICD-10-CM | POA: Diagnosis not present

## 2011-10-02 LAB — CBC
HCT: 24.7 % — ABNORMAL LOW (ref 36.0–46.0)
Hemoglobin: 8.7 g/dL — ABNORMAL LOW (ref 12.0–15.0)
MCH: 33.3 pg (ref 26.0–34.0)
MCHC: 35.2 g/dL (ref 30.0–36.0)
MCV: 94.6 fL (ref 78.0–100.0)
Platelets: 166 10*3/uL (ref 150–400)
RBC: 2.61 MIL/uL — ABNORMAL LOW (ref 3.87–5.11)
RDW: 11.6 % (ref 11.5–15.5)
WBC: 7.1 10*3/uL (ref 4.0–10.5)

## 2011-10-02 LAB — BASIC METABOLIC PANEL
BUN: 7 mg/dL (ref 6–23)
CO2: 28 mEq/L (ref 19–32)
Calcium: 8.1 mg/dL — ABNORMAL LOW (ref 8.4–10.5)
Chloride: 99 mEq/L (ref 96–112)
Creatinine, Ser: 0.51 mg/dL (ref 0.50–1.10)
GFR calc Af Amer: >90 mL/min (ref >90)
GFR calc non Af Amer: >90 mL/min (ref >90)
Glucose, Bld: 163 mg/dL — ABNORMAL HIGH (ref 70–99)
Potassium: 3.9 mEq/L (ref 3.5–5.1)
Sodium: 132 mEq/L — ABNORMAL LOW (ref 135–145)

## 2011-10-02 MED ORDER — POLYSACCHARIDE IRON COMPLEX 150 MG PO CAPS
150.0000 mg | ORAL_CAPSULE | Freq: Every day | ORAL | Status: DC
  Administered 2011-10-02 – 2011-10-04 (×3): 150 mg via ORAL
  Filled 2011-10-02 (×3): qty 1

## 2011-10-02 MED ORDER — HYDROCODONE-ACETAMINOPHEN 5-325 MG PO TABS
1.0000 | ORAL_TABLET | ORAL | Status: DC | PRN
  Administered 2011-10-02: 1 via ORAL
  Administered 2011-10-03: 2 via ORAL
  Administered 2011-10-03: 1 via ORAL
  Administered 2011-10-03 – 2011-10-04 (×4): 2 via ORAL
  Administered 2011-10-04: 1 via ORAL
  Filled 2011-10-02: qty 1
  Filled 2011-10-02: qty 2
  Filled 2011-10-02: qty 1
  Filled 2011-10-02 (×4): qty 2
  Filled 2011-10-02: qty 1
  Filled 2011-10-02: qty 2

## 2011-10-02 MED ORDER — FUROSEMIDE 10 MG/ML IJ SOLN
10.0000 mg | Freq: Once | INTRAMUSCULAR | Status: AC
  Administered 2011-10-02: 10 mg via INTRAVENOUS
  Filled 2011-10-02: qty 1

## 2011-10-02 MED ORDER — ALPRAZOLAM 0.5 MG PO TABS
0.5000 mg | ORAL_TABLET | Freq: Four times a day (QID) | ORAL | Status: DC | PRN
  Administered 2011-10-02: 0.5 mg via ORAL
  Filled 2011-10-02: qty 1

## 2011-10-02 NOTE — Progress Notes (Signed)
Subjective: 1 Day Post-Op Procedure(s) (LRB): TOTAL HIP ARTHROPLASTY (Right) Patient reports pain as moderate.   Patient seen in rounds with Dr. Lequita Halt. Patient has complaints of a lot of pain thru the night.  Family in room We will start therapy today. Plan is to go [facility] after hospital stay.  Objective: Vital signs in last 24 hours: Temp:  [97.7 F (36.5 C)-98.7 F (37.1 C)] 98.2 F (36.8 C) (03/05 0524) Pulse Rate:  [53-96] 72  (03/05 0524) Resp:  [11-19] 14  (03/05 0524) BP: (84-151)/(41-97) 90/52 mmHg (03/05 0524) SpO2:  [98 %-100 %] 100 % (03/05 0524) Weight:  [38.011 kg (83 lb 12.8 oz)] 38.011 kg (83 lb 12.8 oz) (03/04 1545)  Intake/Output from previous day:  Intake/Output Summary (Last 24 hours) at 10/02/11 0802 Last data filed at 10/02/11 0530  Gross per 24 hour  Intake 4092.5 ml  Output   1570 ml  Net 2522.5 ml    Intake/Output this shift:    Labs:  Basename 10/02/11 0407  HGB 8.7*    Basename 10/02/11 0407  WBC 7.1  RBC 2.61*  HCT 24.7*  PLT 166    Basename 10/02/11 0407  NA 132*  K 3.9  CL 99  CO2 28  BUN 7  CREATININE 0.51  GLUCOSE 163*  CALCIUM 8.1*   No results found for this basename: LABPT:2,INR:2 in the last 72 hours  Exam - Neurovascular intact Sensation intact distally Dressing - clean, dry, no drainage Motor function intact - moving foot and toes well on exam.  Hemovac pulled without difficulty.  Past Medical History  Diagnosis Date  . HYPERTHYROIDISM 03/11/2007  . Irritable bowel syndrome 05/19/2009  . HYPERLIPIDEMIA 03/11/2007  . ANEMIA 05/19/2009  . VITAMIN D DEFICIENCY 05/19/2009  . WEIGHT LOSS 03/11/2007  . HYPOKALEMIA 05/19/2009  . Diarrhea 05/19/2009  . ARTHRITIS, HIP 05/19/2009  . ANXIETY DEPRESSION 05/19/2009  . COLITIS 05/22/2007  . UTI 03/11/2007  . Palpitations 10/24/2010  . Lymphadenopathy 11/01/2010  . Chest pain 10/24/2010  . Emphysema 11/01/2010  . History of chicken pox   . History of measles   . H.  pylori infection 11/15/2010  . Shingles 11/15/2010  . Urticaria 11/15/2010  . Postherpetic neuralgia 11/23/2010  . Preoperative clearance 08/16/2011  . COPD (chronic obstructive pulmonary disease)   . Arthritis     right hip     Assessment/Plan: 1 Day Post-Op Procedure(s) (LRB): TOTAL HIP ARTHROPLASTY (Right) Principal Problem:  *Osteoarthritis of hip Active Problems:  Postop Acute blood loss anemia  Postop Hyponatremia   Advance diet Up with therapy Continue foley due to strict I&O and urinary output monitoring Discharge to SNF  DVT Prophylaxis - Xarelto  Protocol Partial-Weight Bearing 25-50% right Leg D/C Knee Immobilizer Hemovac Pulled Begin Therapy Hip Preacutions Keep foley until tomorrow. No vaccines.  Gilberte Gorley 10/02/2011, 8:02 AM

## 2011-10-02 NOTE — Clinical Documentation Improvement (Signed)
    MALNUTRITION DOCUMENTATION CLARIFICATION  THIS DOCUMENT IS NOT A PERMANENT PART OF THE MEDICAL RECORD  TO RESPOND TO THE THIS QUERY, FOLLOW THE INSTRUCTIONS BELOW:  1. If needed, update documentation for the patient's encounter via the notes activity.  2. Access this query again and click edit on the In [employer].  3. After updating, or not, click F2 to complete all highlighted (required) fields concerning your review. Select "additional documentation in the medical record" OR "no additional documentation provided".  4. Click Sign note button.  5. The deficiency will fall out of your In Basket *Please let us know if you are not able to complete this workflow by phone or e-mail (listed below).  Please update your documentation within the medical record to reflect your response to this query.                                                                                        10/02/11   Dear [facility],  In a better effort to capture your patient's severity of illness, reflect appropriate length of stay and utilization of resources, a review of the patient medical record has revealed the following indicators.    Based on your clinical judgment, please clarify and document in a progress note and/or discharge summary the clinical condition associated with the following supporting information:  In responding to this query please exercise your independent judgment.  The fact that a query is asked, does not imply that any particular answer is desired or expected.  Pt admitted for OA Right hip and R THA.  Pt's BMI=   15.4.    Please clarify whether or not BMI can be linked to one of he diagnoses listed below and document in pn  and d/c. Thank You!  [facility]: When linking BMI to a diagnosis please document both BMI and diagnosis together in pn for accuracy of SOI and ROM.    Possible Clinical Conditions?   _______Moderate Malnutrition _______Severe Malnutrition    _______Protein Calorie Malnutrition _______Severe Protein Calorie Malnutrition  _______Other Condition________________ _______Cannot clinically determine     Supporting Information: Risk Factors: OA Right hip, H/O Weight loss, Vit D Deficiency, Anemia, Colitis, IBS, Diverticulosis  Signs & Symptoms:  BMI-15.4 5'2"/83 lbs    Diagnostics:  Component     Latest Ref Rng 10/02/2011  Calcium     8.4 - 10.5 mg/dL 8.1 (L)   Component     Latest Ref Rng 10/02/2011  HGB     12.0 - 15.0 g/dL 8.7 (L)  HCT     47.8 - 46.0 % 24.7 (L)    Treatment  Strict I & O Diet: Clear liq to progress to Reg   You may use possible, probable, or suspect with inpatient documentation. possible, probable, suspected diagnoses MUST be documented at the time of discharge  Reviewed: additional documentation in the medical record  Thank You,  Enis Slipper  RN, BSN, CCDS Clinical Documentation Specialist [facility] Dept Pager: [phone] / E-mail: [email or web address]   Health Information Management [place]

## 2011-10-02 NOTE — Evaluation (Signed)
Physical Therapy Evaluation Patient Details Name: [name] MRN: [id number] DOB: 1940-11-14 Today's Date: 10/02/2011  Problem List:  Patient Active Problem List  Diagnoses  . HYPERTHYROIDISM  . VITAMIN D DEFICIENCY  . HYPERLIPIDEMIA  . HYPOKALEMIA  . ANEMIA  . ANXIETY DEPRESSION  . COLITIS  . Irritable bowel syndrome  . ARTHRITIS, HIP  . WEIGHT LOSS  . Chest pain  . Palpitations  . Lymphadenopathy  . Emphysema  . History of chicken pox  . History of measles  . H. pylori infection  . Postherpetic neuralgia  . Preoperative clearance  . Dyspnea on exertion  . Heart palpitations  . OA (osteoarthritis) of knee  . Osteoarthritis of hip  . Postop Acute blood loss anemia  . Postop Hyponatremia    Past Medical History:  Past Medical History  Diagnosis Date  . HYPERTHYROIDISM 03/11/2007  . Irritable bowel syndrome 05/19/2009  . HYPERLIPIDEMIA 03/11/2007  . ANEMIA 05/19/2009  . VITAMIN D DEFICIENCY 05/19/2009  . WEIGHT LOSS 03/11/2007  . HYPOKALEMIA 05/19/2009  . Diarrhea 05/19/2009  . ARTHRITIS, HIP 05/19/2009  . ANXIETY DEPRESSION 05/19/2009  . COLITIS 05/22/2007  . UTI 03/11/2007  . Palpitations 10/24/2010  . Lymphadenopathy 11/01/2010  . Chest pain 10/24/2010  . Emphysema 11/01/2010  . History of chicken pox   . History of measles   . H. pylori infection 11/15/2010  . Shingles 11/15/2010  . Urticaria 11/15/2010  . Postherpetic neuralgia 11/23/2010  . Preoperative clearance 08/16/2011  . COPD (chronic obstructive pulmonary disease)   . Arthritis     right hip    Past Surgical History:  Past Surgical History  Procedure Date  . Abdominal hysterectomy     total  . Appendectomy   . Other surgical history     bilateral breat biopsy wtih- fibrocystic breast disease     PT Assessment/Plan/Recommendation PT Assessment Clinical Impression Statement: Pt presents s/p R THR (post approach) with decreased strength, ROM, and mobility.  Pt tolerated some ambulation, however w/  c/o dizziness/nausea.  Pt's BP was 90/48 upon sitting in chair and was 100/61 following sitting for approx 4 mins.  Pt also had c/o of chest pain.  RN made aware of symptoms.  Pt will benefit from skilled PT in acute venue in order to address deficits.  PT recommends SNF for follow up thearpy at D/C to ensure pt safety and decrease burden of care.  PT Recommendation/Assessment: Patient will need skilled PT in the acute care venue PT Problem List: Decreased strength;Decreased range of motion;Decreased knowledge of use of DME;Decreased activity tolerance;Decreased balance;Decreased mobility;Decreased safety awareness;Decreased knowledge of precautions;Pain Barriers to Discharge: None Barriers to Discharge Comments: Pt states that her husband is sick and that she is the primary caregiver, however has set up for aide to assist during pts rehab stay.  PT Therapy Diagnosis : Difficulty walking;Abnormality of gait;Generalized weakness;Acute pain PT Plan PT Frequency: 7X/week PT Treatment/Interventions: DME instruction;Gait training;Functional mobility training;Therapeutic activities;Therapeutic exercise;Patient/family education PT Recommendation Recommendations for Other Services: OT consult Follow Up Recommendations: Skilled nursing facility Equipment Recommended: Defer to next venue PT Goals  Acute Rehab PT Goals PT Goal Formulation: With patient Time For Goal Achievement: 7 days Pt will go Supine/Side to Sit: with supervision PT Goal: Supine/Side to Sit - Progress: Goal set today Pt will go Sit to Supine/Side: with supervision PT Goal: Sit to Supine/Side - Progress: Goal set today Pt will go Sit to Stand: with supervision PT Goal: Sit to Stand - Progress: Goal set today Pt will  go Stand to Sit: with supervision PT Goal: Stand to Sit - Progress: Goal set today Pt will Ambulate: 51 - 150 feet;with supervision;with least restrictive assistive device PT Goal: Ambulate - Progress: Goal set today Pt  will Perform Home Exercise Program: with supervision, verbal cues required/provided PT Goal: Perform Home Exercise Program - Progress: Goal set today  PT Evaluation Precautions/Restrictions  Precautions Precautions: Posterior Hip Precaution Booklet Issued: Yes (comment) Precaution Comments: Educated pt/daughter on hip precautions.  Required Braces or Orthoses: No Restrictions Weight Bearing Restrictions: Yes RLE Weight Bearing: Partial weight bearing RLE Partial Weight Bearing Percentage or Pounds: 25-50 Prior Functioning  Home Living Lives With: Significant other (pt states that husband is sick and she has been caring for h) Receives Help From: Family Home Layout: Two level;Able to live on main level with bedroom/bathroom Alternate Level Stairs-Rails: Can reach both Alternate Level Stairs-Number of Steps: 12 Home Access: Stairs to enter Entrance Stairs-Rails: None Entrance Stairs-Number of Steps: one up to porch and one in Home Adaptive Equipment: Shower chair with back Additional Comments: Pt states she has hand rail on wall in tub.   Prior Function Level of Independence: Independent with basic ADLs;Independent with gait;Independent with transfers Driving: Yes Vocation: Retired Producer, television/film/video: Awake/alert Overall Cognitive Status: Appears within functional limits for tasks assessed Orientation Level: Oriented X4 Sensation/Coordination Sensation Light Touch: Appears Intact Coordination Gross Motor Movements are Fluid and Coordinated: Yes Extremity Assessment RLE Assessment RLE Assessment: Exceptions to [facility] RLE Strength RLE Overall Strength Comments: Ankle motions WFL, unable to test knee/hip due to WB precautions and pain.  LLE Assessment LLE Assessment: Exceptions to [facility] LLE Strength LLE Overall Strength Comments: Grossly WFL per functional assessment.  Mobility (including Balance) Bed Mobility Bed Mobility: Yes Supine to Sit: 1: +2 Total  assist;Patient percentage (comment);HOB elevated (Comment degrees) Supine to Sit Details (indicate cue type and reason): Pt assisted 60%.  cues for hand placement to self assist and to maintain hip precautions due to pt tendency to IR on RLE.  Transfers Transfers: Yes Sit to Stand: 1: +2 Total assist;Patient percentage (comment);From elevated surface;With upper extremity assist;From bed Sit to Stand Details (indicate cue type and reason): Pt assisted 75%.  Cues for RLE management to maintain hip precautions and for hand placement on the bed.  Stand to Sit: 1: +2 Total assist;Patient percentage (comment);With upper extremity assist;With armrests;To chair/3-in-1 Stand to Sit Details: Pt assisted 75%.  Assisted for controlled descent due to pt stating she feels sick with cues for hand placment and RLE management.  Ambulation/Gait Ambulation/Gait: Yes Ambulation/Gait Assistance: 1: +2 Total assist;Patient percentage (comment) Ambulation/Gait Assistance Details (indicate cue type and reason): Pt assisted 60%.  Assist and cues for sequencing/technique with RW.  Noted pt tendency to step too far inside RW.  Cues to correct.  Ambulation Distance (Feet): 15 Feet Assistive device: Rolling walker Gait Pattern: Step-to pattern;Decreased stance time - right;Decreased step length - left;Trunk flexed Gait velocity: decreased Stairs: No Wheelchair Mobility Wheelchair Mobility: No    Exercise    End of Session PT - End of Session Equipment Utilized During Treatment: Gait belt Activity Tolerance: Patient limited by pain;Other (comment) (Limited by nausea/dizziness. ) Patient left: in chair;with call bell in reach;with family/visitor present Nurse Communication: Mobility status for transfers;Mobility status for ambulation General Behavior During Session: [facility] for tasks performed Cognition: [facility] for tasks performed  Page, Meribeth Mattes 10/02/2011, 10:09 AM

## 2011-10-02 NOTE — Progress Notes (Signed)
FL2 and 30 Day Note in shadow chart for MD signature. Pt plans to have ST rehab at [facility]. SNF has confirmed plan. CSW will assist with d/c planning to SNF.

## 2011-10-02 NOTE — Progress Notes (Signed)
Physical Therapy Treatment Patient Details Name: [name] MRN: [id number] DOB: Oct 20, 1940 Today's Date: 10/02/2011  PT Assessment/Plan  PT - Assessment/Plan Comments on Treatment Session: Pt declined second ambulation due to nausea and some heartburn type issues.  Pt did agree to perform exercises.   PT Plan: Discharge plan remains appropriate PT Frequency: 7X/week Recommendations for Other Services: OT consult Follow Up Recommendations: Skilled nursing facility Equipment Recommended: Defer to next venue PT Goals  Acute Rehab PT Goals PT Goal Formulation: With patient Time For Goal Achievement: 7 days Pt will Perform Home Exercise Program: with supervision, verbal cues required/provided PT Goal: Perform Home Exercise Program - Progress: Progressing toward goal  PT Treatment Precautions/Restrictions  Precautions Precautions: Posterior Hip Precaution Booklet Issued: Yes (comment) Precaution Comments: Educated pt/daughter on hip precautions.  Required Braces or Orthoses: No Restrictions Weight Bearing Restrictions: Yes RLE Weight Bearing: Partial weight bearing RLE Partial Weight Bearing Percentage or Pounds: 25-50 Mobility (including Balance) Bed Mobility Bed Mobility: Yes Scooting to HOB: 5: Supervision Scooting to [facility] Details (indicate cue type and reason): Supervision for safety with cues for technique.     Exercise  Total Joint Exercises Ankle Circles/Pumps: AROM;Both;20 reps;Supine Quad Sets: AROM;Right;10 reps;Supine Short Arc Quad: AROM;Right;10 reps;Supine Heel Slides: AAROM;Left;10 reps;Supine Hip ABduction/ADduction: AAROM;Right;10 reps;Supine End of Session PT - End of Session Activity Tolerance: Patient limited by pain Patient left: in bed;with call bell in reach;with family/visitor present General Behavior During Session: [facility] for tasks performed Cognition: [facility] for tasks performed  Page, Meribeth Mattes 10/02/2011, 2:49 PM

## 2011-10-03 LAB — CBC
HCT: 23.8 % — ABNORMAL LOW (ref 36.0–46.0)
Hemoglobin: 8.3 g/dL — ABNORMAL LOW (ref 12.0–15.0)
MCH: 33.1 pg (ref 26.0–34.0)
MCHC: 34.9 g/dL (ref 30.0–36.0)
MCV: 94.8 fL (ref 78.0–100.0)
Platelets: 139 10*3/uL — ABNORMAL LOW (ref 150–400)
RBC: 2.51 MIL/uL — ABNORMAL LOW (ref 3.87–5.11)
RDW: 11.5 % (ref 11.5–15.5)
WBC: 5.2 10*3/uL (ref 4.0–10.5)

## 2011-10-03 LAB — BASIC METABOLIC PANEL
BUN: 6 mg/dL (ref 6–23)
CO2: 32 mEq/L (ref 19–32)
Calcium: 8.5 mg/dL (ref 8.4–10.5)
Chloride: 100 mEq/L (ref 96–112)
Creatinine, Ser: 0.6 mg/dL (ref 0.50–1.10)
GFR calc Af Amer: >90 mL/min (ref >90)
GFR calc non Af Amer: >90 mL/min (ref >90)
Glucose, Bld: 92 mg/dL (ref 70–99)
Potassium: 3.5 mEq/L (ref 3.5–5.1)
Sodium: 134 mEq/L — ABNORMAL LOW (ref 135–145)

## 2011-10-03 NOTE — Progress Notes (Signed)
INITIAL ADULT NUTRITION ASSESSMENT Date: 10/03/2011   Time: 4:44 PM Reason for Assessment: consult  ASSESSMENT: Female 71 y.o.  Dx: Osteoarthritis of hip  Hx:  Past Medical History  Diagnosis Date  . HYPERTHYROIDISM 03/11/2007  . Irritable bowel syndrome 05/19/2009  . HYPERLIPIDEMIA 03/11/2007  . ANEMIA 05/19/2009  . VITAMIN D DEFICIENCY 05/19/2009  . WEIGHT LOSS 03/11/2007  . HYPOKALEMIA 05/19/2009  . Diarrhea 05/19/2009  . ARTHRITIS, HIP 05/19/2009  . ANXIETY DEPRESSION 05/19/2009  . COLITIS 05/22/2007  . UTI 03/11/2007  . Palpitations 10/24/2010  . Lymphadenopathy 11/01/2010  . Chest pain 10/24/2010  . Emphysema 11/01/2010  . History of chicken pox   . History of measles   . H. pylori infection 11/15/2010  . Shingles 11/15/2010  . Urticaria 11/15/2010  . Postherpetic neuralgia 11/23/2010  . Preoperative clearance 08/16/2011  . COPD (chronic obstructive pulmonary disease)   . Arthritis     right hip    Past Surgical History  Procedure Date  . Abdominal hysterectomy     total  . Appendectomy   . Other surgical history     bilateral breat biopsy wtih- fibrocystic breast disease     Related Meds:  Scheduled Meds:   . ALPRAZolam  1 mg Oral BID  . docusate sodium  100 mg Oral BID  . iron polysaccharides  150 mg Oral Daily  . rivaroxaban  10 mg Oral Q breakfast   Continuous Infusions:   . DISCONTD: dextrose 5 % and 0.9% NaCl 10 mL/hr at 10/02/11 1940   PRN Meds:.acetaminophen, acetaminophen, albuterol, ALPRAZolam, bisacodyl, diphenhydrAMINE, HYDROcodone-acetaminophen, menthol-cetylpyridinium, methocarbamol(ROBAXIN) IV, methocarbamol, metoCLOPramide (REGLAN) injection, metoCLOPramide, morphine, ondansetron (ZOFRAN) IV, ondansetron, phenol, polyethylene glycol, temazepam, DISCONTD: oxyCODONE   Ht: 5\' 2"  (157.5 cm)  Wt: 83 lb 12.8 oz (38.011 kg)  Ideal Wt: 50kg % Ideal Wt: 76%  Usual Wt: 120 lbs per daughter % Usual Wt: 69%  Body mass index is 15.33  kg/(m^2).  Food/Nutrition Related Hx: poor PO PTA, pt with IBS  Labs:  CMP     Component Value Date/Time   NA 134* 10/03/2011 0432   K 3.5 10/03/2011 0432   CL 100 10/03/2011 0432   CO2 32 10/03/2011 0432   GLUCOSE 92 10/03/2011 0432   BUN 6 10/03/2011 0432   CREATININE 0.60 10/03/2011 0432   CALCIUM 8.5 10/03/2011 0432   PROT 7.1 09/24/2011 1120   ALBUMIN 4.5 09/24/2011 1120   AST 19 09/24/2011 1120   ALT 12 09/24/2011 1120   ALKPHOS 69 09/24/2011 1120   BILITOT 0.6 09/24/2011 1120   GFRNONAA >90 10/03/2011 0432   GFRAA >90 10/03/2011 0432    Intake: 0% of meals Output:   Intake/Output Summary (Last 24 hours) at 10/03/11 1646 Last data filed at 10/03/11 1400  Gross per 24 hour  Intake 1286.08 ml  Output   1610 ml  Net -323.92 ml   No BM since admission  Diet Order: Regular  Supplements/Tube Feeding: none at this time  IVF:    DISCONTD: dextrose 5 % and 0.9% NaCl Last Rate: 10 mL/hr at 10/02/11 1940    Estimated Nutritional Needs:   Kcal: 1070-1220 kcal Protein: 40-45g Fluid: >1.3 L/day  Pt admitted s/p R hip arthroplasty.  Post-op pt has been refusing most meals, intake 0%- bites of food.  Daughter and pt in room state this has been her usual for the past year.  Daughter reports pt's usual wt was 120 lbs 1 year ago and pt has lost a severe amount  of wt- now weighs 83 lbs. Review of wt hx available in chart shows pt weighed ~43 kg March 2012 and 42 kg as recently as January 2013.  This indicates pt has lost 11% of body wt in the past 3 months.  Pt was able to get OOB with PT today and yesterday.  Pt qualifies for severe protein-calorie malnutrition based on severe wt loss and decreased PO intake for several days. Noted that even her usual wt of 42-43 kg is underweight for her build and age.  BMI at this wt would be 17.3  NUTRITION DIAGNOSIS: -Inadequate oral intake (NI-2.1).  Status: Ongoing  RELATED TO: IBS, anorexia  AS EVIDENCE BY: pt with >30% wt loss in past  yr  MONITORING/EVALUATION(Goals): 1.  Food/Beverage; pt to consume 3 small meals and 3 small snacks daily.  EDUCATION NEEDS: -Education needs addressed  INTERVENTION: 1.  Brief education; provided for pt and daughter.  Discussed nutrition goals for healing and managing IBS.  Discussed preferred foods and food frequency.  Pt's intake has been severely inadequate due to fear of cramping and diarrhea associated with IBS.  Made a list of foods well-tolerated and well-liked with pt.  Devised a meal plan of 3 meals and 3 snacks that would accomplish kcal and protein goals.  Pt has ordered dinner for this evening.  RD contact information provided.  Pt and daughter verbalized understanding. 2.  Supplements; Boost prn.  Pt states family bringing from home.  Will order prn if needed  Dietitian #:[phone]  DOCUMENTATION CODES Per approved criteria  -Severe malnutrition in the context of chronic illness    Hoyt Koch 10/03/2011, 4:44 PM

## 2011-10-03 NOTE — Progress Notes (Signed)
Physical Therapy Treatment Patient Details Name: [name] MRN: [id number] DOB: 02/26/1941 Today's Date: 10/03/2011  PT Assessment/Plan  PT - Assessment/Plan Comments on Treatment Session: Pt making good progress this afternoon.  States that she was able to eat some lunch, giving her more energy.   PT Plan: Discharge plan remains appropriate PT Frequency: 7X/week Recommendations for Other Services: OT consult Follow Up Recommendations: Skilled nursing facility Equipment Recommended: Defer to next venue PT Goals  Acute Rehab PT Goals PT Goal Formulation: With patient Time For Goal Achievement: 7 days Pt will go Sit to Supine/Side: with supervision PT Goal: Sit to Supine/Side - Progress: Progressing toward goal Pt will go Sit to Stand: with supervision PT Goal: Sit to Stand - Progress: Progressing toward goal Pt will go Stand to Sit: with supervision PT Goal: Stand to Sit - Progress: Progressing toward goal Pt will Ambulate: 51 - 150 feet;with supervision;with least restrictive assistive device PT Goal: Ambulate - Progress: Progressing toward goal Pt will Perform Home Exercise Program: with supervision, verbal cues required/provided PT Goal: Perform Home Exercise Program - Progress: Progressing toward goal  PT Treatment Precautions/Restrictions  Precautions Precautions: Posterior Hip Precaution Booklet Issued: Yes (comment) Precaution Comments: Pt unable to recall hip precautions, re-educated pt.  Required Braces or Orthoses: No Restrictions Weight Bearing Restrictions: Yes RLE Weight Bearing: Partial weight bearing RLE Partial Weight Bearing Percentage or Pounds: 25-50 Mobility (including Balance) Bed Mobility Bed Mobility: Yes Sit to Supine: 4: Min assist;HOB flat Sit to Supine - Details (indicate cue type and reason): Min assist for RLE into bed with cues for hand placement.  Transfers Transfers: Yes Sit to Stand: 4: Min assist;With upper extremity assist;With  armrests;From chair/3-in-1 Sit to Stand Details (indicate cue type and reason): cues for hand placement and RLE management.  Stand to Sit: 4: Min assist;With upper extremity assist;To bed Stand to Sit Details: Assist for controlled descent with cues for hand placement and RLE managment.  Ambulation/Gait Ambulation/Gait: Yes Ambulation/Gait Assistance: 4: Min assist Ambulation/Gait Assistance Details (indicate cue type and reason): cues for sequencing/technique with some manual placement of RW required due to pt tendency to veer to the right side.  Ambulation Distance (Feet): 50 Feet Assistive device: Rolling walker Gait Pattern: Step-to pattern;Decreased stance time - right;Decreased step length - left;Trunk flexed Gait velocity: decreased    Exercise  Total Joint Exercises Ankle Circles/Pumps: AROM;Both;20 reps;Supine Quad Sets: AROM;Right;10 reps;Supine Short Arc Quad: AROM;Right;10 reps;Supine Heel Slides: AAROM;Left;10 reps;Supine Hip ABduction/ADduction: AAROM;Right;10 reps;Supine End of Session PT - End of Session Activity Tolerance: Patient limited by fatigue Patient left: in bed;with call bell in reach;with family/visitor present General Behavior During Session: [facility] for tasks performed Cognition: [facility] for tasks performed  Page, Meribeth Mattes 10/03/2011, 2:16 PM

## 2011-10-03 NOTE — Progress Notes (Signed)
   CARE MANAGEMENT NOTE 10/03/2011  Patient:  [name], [name]   Account Number:  [id number]  Date Initiated:  10/03/2011  Documentation initiated by:  Colleen Can  Subjective/Objective Assessment:   dx total hip arthroplasty-right     Action/Plan:   STSNF   Anticipated DC Date:  10/04/2011   Anticipated DC Plan:  SKILLED NURSING FACILITY  In-house referral  Clinical Social Worker      DC Planning Services  NA      [facility] Choice  NA   Choice offered to / List presented to:  NA   DME arranged  NA      DME agency  NA     HH arranged  NA      HH agency  NA   Status of service:  Completed, signed off Medicare Important Message given?  NA - LOS <3 / Initial given by admissions (If response is "NO", the following Medicare IM given date fields will be blank)

## 2011-10-03 NOTE — Evaluation (Addendum)
Occupational Therapy Evaluation Patient Details Name: [name] MRN: [id number] DOB: 08/28/1940 Today's Date: 10/03/2011  Problem List:  Patient Active Problem List  Diagnoses  . HYPERTHYROIDISM  . VITAMIN D DEFICIENCY  . HYPERLIPIDEMIA  . HYPOKALEMIA  . ANEMIA  . ANXIETY DEPRESSION  . COLITIS  . Irritable bowel syndrome  . ARTHRITIS, HIP  . WEIGHT LOSS  . Chest pain  . Palpitations  . Lymphadenopathy  . Emphysema  . History of chicken pox  . History of measles  . H. pylori infection  . Postherpetic neuralgia  . Preoperative clearance  . Dyspnea on exertion  . Heart palpitations  . OA (osteoarthritis) of knee  . Osteoarthritis of hip  . Postop Acute blood loss anemia  . Postop Hyponatremia    Past Medical History:  Past Medical History  Diagnosis Date  . HYPERTHYROIDISM 03/11/2007  . Irritable bowel syndrome 05/19/2009  . HYPERLIPIDEMIA 03/11/2007  . ANEMIA 05/19/2009  . VITAMIN D DEFICIENCY 05/19/2009  . WEIGHT LOSS 03/11/2007  . HYPOKALEMIA 05/19/2009  . Diarrhea 05/19/2009  . ARTHRITIS, HIP 05/19/2009  . ANXIETY DEPRESSION 05/19/2009  . COLITIS 05/22/2007  . UTI 03/11/2007  . Palpitations 10/24/2010  . Lymphadenopathy 11/01/2010  . Chest pain 10/24/2010  . Emphysema 11/01/2010  . History of chicken pox   . History of measles   . H. pylori infection 11/15/2010  . Shingles 11/15/2010  . Urticaria 11/15/2010  . Postherpetic neuralgia 11/23/2010  . Preoperative clearance 08/16/2011  . COPD (chronic obstructive pulmonary disease)   . Arthritis     right hip    Past Surgical History:  Past Surgical History  Procedure Date  . Abdominal hysterectomy     total  . Appendectomy   . Other surgical history     bilateral breat biopsy wtih- fibrocystic breast disease     OT Assessment/Plan/Recommendation OT Assessment Clinical Impression Statement: Pt is a 71 yo female who presents POD#1 post THR. Skilled OT recommended to maximize I w/BADLs in prep for d/c to next  venue of care. OT Recommendation/Assessment: Patient will need skilled OT in the acute care venue OT Problem List: Decreased activity tolerance;Decreased safety awareness;Decreased knowledge of use of DME or AE Barriers to Discharge: Inaccessible home environment;Decreased caregiver support OT Therapy Diagnosis : Generalized weakness OT Plan OT Frequency: Min 1X/week OT Treatment/Interventions: Self-care/ADL training;Therapeutic activities;DME and/or AE instruction;Patient/family education OT Recommendation Follow Up Recommendations: Skilled nursing facility Equipment Recommended: Defer to next venue Individuals Consulted Consulted and Agree with Results and Recommendations: Patient;Family member/caregiver OT Goals Acute Rehab OT Goals OT Goal Formulation: With patient/family Time For Goal Achievement: 2 weeks ADL Goals Pt Will Perform Grooming: with supervision;Sitting, edge of bed;Unsupported (X 3 tasks.) ADL Goal: Grooming - Progress: Goal set today Pt Will Transfer to Toilet: with min assist;3-in-1;Stand pivot transfer;Ambulation ADL Goal: Toilet Transfer - Progress: Goal set today Pt Will Perform Toileting - Clothing Manipulation: with min assist;Standing ADL Goal: Toileting - Clothing Manipulation - Progress: Goal set today Pt Will Perform Toileting - Hygiene: with min assist;Sit to stand from 3-in-1/toilet ADL Goal: Toileting - Hygiene - Progress: Goal set today  OT Evaluation Precautions/Restrictions  Precautions Precautions: Posterior Hip Precaution Booklet Issued: Yes (comment) Precaution Comments: Pt unable to recall hip precautions, re-educated pt.  Required Braces or Orthoses: No Restrictions Weight Bearing Restrictions: Yes RLE Weight Bearing: Partial weight bearing RLE Partial Weight Bearing Percentage or Pounds: 25-50 Prior Functioning Home Living Lives With: Spouse Additional Comments: Pt planning d/c to [facility]. Prior Function Level  of Independence:  Independent with basic ADLs;Independent with transfers;Independent with gait Driving: Yes Vocation: Retired ADL ADL Grooming: Performed;Wash/dry face Where Assessed - Grooming: Sitting, chair;Supported Upper Body Bathing: Simulated;Set up Where Assessed - Upper Body Bathing: Sitting, chair;Supported Lower Body Bathing: +1 Total assistance;Simulated Where Assessed - Lower Body Bathing: Sit to stand from bed Upper Body Dressing: Simulated;Set up Where Assessed - Upper Body Dressing: Sitting, bed;Unsupported Lower Body Dressing: +1 Total assistance;Simulated Where Assessed - Lower Body Dressing: Sit to stand from bed Toilet Transfer: Comment for patient %;Simulated (Pt 75%) Toilet Transfer Details (indicate cue type and reason): cues for technique, safety. Toilet Transfer Method: Ambulating Toileting - Clothing Manipulation: +1 Total assistance;Simulated Where Assessed - Glass blower/designer Manipulation: Standing Toileting - Hygiene: Simulated;+1 Total assistance Where Assessed - Toileting Hygiene: Standing Tub/Shower Transfer: Not assessed Tub/Shower Transfer Method: Not assessed Equipment Used: Rolling walker ADL Comments: Pt limited by fatigue. Daughter states pt hasnt eaten in 3 days. C/O reflux and heartburn. Also introduced and demonstrated AE necessary to independently complete LB ADLs. Vision/Perception    Cognition Cognition Arousal/Alertness: Awake/alert Overall Cognitive Status: Appears within functional limits for tasks assessed Orientation Level: Oriented X4 Sensation/Coordination   Extremity Assessment RUE Assessment RUE Assessment: Within Functional Limits LUE Assessment LUE Assessment: Within Functional Limits Mobility  Bed Mobility Bed Mobility: Yes Supine to Sit: 1: +2 Total assist Supine to Sit Details (indicate cue type and reason): Pt assist 65%.  Assist for RLE and some assist for trunk with cues for hand placement/technique.  Transfers Sit to Stand:  From elevated surface;3: Mod assist;4: Min assist;With upper extremity assist;From bed Sit to Stand Details (indicate cue type and reason): Assist for forward weight shift with cues for hand placement and RLE management.  Stand to Sit: 4: Min assist;With upper extremity assist;With armrests;To chair/3-in-1 Stand to Sit Details: Assist for controlled descent with cues for hand placement and RLE management.  Exercises   End of Session OT - End of Session Equipment Utilized During Treatment: Gait belt Activity Tolerance: Patient limited by fatigue Patient left: in chair;with call bell in reach General Behavior During Session: [facility] for tasks performed Cognition: [facility] for tasks performed   Analia Zuk A OTR/L [phone] 10/03/2011, 10:48 AM

## 2011-10-03 NOTE — Progress Notes (Signed)
Physical Therapy Treatment Patient Details Name: Jenifer Struve MRN: [id number] DOB: Apr 15, 1941 Today's Date: 10/03/2011  PT Assessment/Plan  PT - Assessment/Plan Comments on Treatment Session: Pt making slow progression.  States she feels very weak due to not eating anything due to acid reflux issues.  Continue to recommend SNF for follow up at D/C.  PT Plan: Discharge plan remains appropriate PT Frequency: 7X/week Recommendations for Other Services: OT consult Follow Up Recommendations: Skilled nursing facility Equipment Recommended: Defer to next venue PT Goals  Acute Rehab PT Goals PT Goal Formulation: With patient Time For Goal Achievement: 7 days Pt will go Supine/Side to Sit: with supervision PT Goal: Supine/Side to Sit - Progress: Progressing toward goal Pt will go Sit to Stand: with supervision PT Goal: Sit to Stand - Progress: Progressing toward goal Pt will go Stand to Sit: with supervision PT Goal: Stand to Sit - Progress: Progressing toward goal Pt will Ambulate: 51 - 150 feet;with supervision;with least restrictive assistive device PT Goal: Ambulate - Progress: Progressing toward goal  PT Treatment Precautions/Restrictions  Precautions Precautions: Posterior Hip Precaution Booklet Issued: Yes (comment) Precaution Comments: Pt unable to recall hip precautions, re-educated pt.  Required Braces or Orthoses: No Restrictions Weight Bearing Restrictions: Yes RLE Weight Bearing: Partial weight bearing RLE Partial Weight Bearing Percentage or Pounds: 25-50 Mobility (including Balance) Bed Mobility Bed Mobility: Yes Supine to Sit: 1: +2 Total assist Supine to Sit Details (indicate cue type and reason): Pt assist 65%.  Assist for RLE and some assist for trunk with cues for hand placement/technique.  Transfers Transfers: Yes Sit to Stand: From elevated surface;3: Mod assist;4: Min assist;With upper extremity assist;From bed Sit to Stand Details (indicate cue type and  reason): Assist for forward weight shift with cues for hand placement and RLE management.  Stand to Sit: 4: Min assist;With upper extremity assist;With armrests;To chair/3-in-1 Stand to Sit Details: Assist for controlled descent with cues for hand placement and RLE management.  Ambulation/Gait Ambulation/Gait: Yes Ambulation/Gait Assistance: 3: Mod assist Ambulation/Gait Assistance Details (indicate cue type and reason): Cues for sequencing/technique with RW, cues for increased step length B LE.  Ambulation Distance (Feet): 10 Feet Assistive device: Rolling walker Gait Pattern: Step-to pattern;Decreased stance time - right;Decreased step length - left;Trunk flexed Gait velocity: decreased    Exercise    End of Session PT - End of Session Activity Tolerance: Patient limited by pain;Other (comment) (Pt limited by some dizziness. ) Patient left: in chair;with call bell in reach;with family/visitor present General Behavior During Session: [facility] for tasks performed Cognition: [facility] for tasks performed  Page, Meribeth Mattes 10/03/2011, 9:35 AM

## 2011-10-03 NOTE — Progress Notes (Signed)
Subjective: 2 Days Post-Op Procedure(s) (LRB): TOTAL HIP ARTHROPLASTY (Right) Patient reports pain as mild and moderate.   Patient seen in rounds with Dr. Lequita Halt. Patient has complaints of some soreness when she takes a deep breath.  Her discomfort is reproducible with compression on the chest wall.  She denies and nausea and vomiting.  No chest pain or SOB, but does has some pleuritic discomfort.  Most likely from positioning on the OR table.  She did walk short distance yesterday.  Plan is for SNF/Rehab.  Objective: Vital signs in last 24 hours: Temp:  [98.3 F (36.8 C)-98.9 F (37.2 C)] 98.9 F (37.2 C) (03/06 0546) Pulse Rate:  [73-74] 73  (03/06 0546) Resp:  [16] 16  (03/06 0546) BP: (93-102)/(55-57) 93/55 mmHg (03/06 0546) SpO2:  [94 %-100 %] 98 % (03/06 0546)  Intake/Output from previous day:  Intake/Output Summary (Last 24 hours) at 10/03/11 1151 Last data filed at 10/03/11 0816  Gross per 24 hour  Intake 1318.75 ml  Output   2010 ml  Net -691.25 ml    Intake/Output this shift: Total I/O In: 240 [P.O.:240] Out: -   Labs:  Basename 10/03/11 0432 10/02/11 0407  HGB 8.3* 8.7*    Basename 10/03/11 0432 10/02/11 0407  WBC 5.2 7.1  RBC 2.51* 2.61*  HCT 23.8* 24.7*  PLT 139* 166    Basename 10/03/11 0432 10/02/11 0407  NA 134* 132*  K 3.5 3.9  CL 100 99  CO2 32 28  BUN 6 7  CREATININE 0.60 0.51  GLUCOSE 92 163*  CALCIUM 8.5 8.1*   No results found for this basename: LABPT:2,INR:2 in the last 72 hours  Exam - Neurovascular intact Sensation intact distally Dressing/Incision - clean, dry, no drainage Motor function intact - moving foot and toes well on exam.   Past Medical History  Diagnosis Date  . HYPERTHYROIDISM 03/11/2007  . Irritable bowel syndrome 05/19/2009  . HYPERLIPIDEMIA 03/11/2007  . ANEMIA 05/19/2009  . VITAMIN D DEFICIENCY 05/19/2009  . WEIGHT LOSS 03/11/2007  . HYPOKALEMIA 05/19/2009  . Diarrhea 05/19/2009  . ARTHRITIS, HIP 05/19/2009   . ANXIETY DEPRESSION 05/19/2009  . COLITIS 05/22/2007  . UTI 03/11/2007  . Palpitations 10/24/2010  . Lymphadenopathy 11/01/2010  . Chest pain 10/24/2010  . Emphysema 11/01/2010  . History of chicken pox   . History of measles   . H. pylori infection 11/15/2010  . Shingles 11/15/2010  . Urticaria 11/15/2010  . Postherpetic neuralgia 11/23/2010  . Preoperative clearance 08/16/2011  . COPD (chronic obstructive pulmonary disease)   . Arthritis     right hip     Assessment/Plan: 2 Days Post-Op Procedure(s) (LRB): TOTAL HIP ARTHROPLASTY (Right) Principal Problem:  *Osteoarthritis of hip Active Problems:  Postop Acute blood loss anemia  Postop Hyponatremia   Advance diet Up with therapy Plan for discharge tomorrow Discharge to SNF  DVT Prophylaxis - Xarelto  Protocol Partial-Weight Bearing 25-50% right Leg  Lianah Peed 10/03/2011, 11:51 AM

## 2011-10-04 DIAGNOSIS — E876 Hypokalemia: Secondary | ICD-10-CM | POA: Diagnosis not present

## 2011-10-04 LAB — BASIC METABOLIC PANEL
BUN: 6 mg/dL (ref 6–23)
CO2: 28 mEq/L (ref 19–32)
Calcium: 8.3 mg/dL — ABNORMAL LOW (ref 8.4–10.5)
Chloride: 98 mEq/L (ref 96–112)
Creatinine, Ser: 0.46 mg/dL — ABNORMAL LOW (ref 0.50–1.10)
GFR calc Af Amer: >90 mL/min (ref >90)
GFR calc non Af Amer: >90 mL/min (ref >90)
Glucose, Bld: 89 mg/dL (ref 70–99)
Potassium: 3.4 mEq/L — ABNORMAL LOW (ref 3.5–5.1)
Sodium: 133 mEq/L — ABNORMAL LOW (ref 135–145)

## 2011-10-04 LAB — CBC
HCT: 25.1 % — ABNORMAL LOW (ref 36.0–46.0)
Hemoglobin: 8.8 g/dL — ABNORMAL LOW (ref 12.0–15.0)
MCH: 33.2 pg (ref 26.0–34.0)
MCHC: 35.1 g/dL (ref 30.0–36.0)
MCV: 94.7 fL (ref 78.0–100.0)
Platelets: 157 10*3/uL (ref 150–400)
RBC: 2.65 MIL/uL — ABNORMAL LOW (ref 3.87–5.11)
RDW: 11.6 % (ref 11.5–15.5)
WBC: 5.3 10*3/uL (ref 4.0–10.5)

## 2011-10-04 MED ORDER — BISACODYL 10 MG RE SUPP: 10.0000 mg | Freq: Every day | RECTAL | Status: AC | PRN

## 2011-10-04 MED ORDER — POTASSIUM CHLORIDE CRYS ER 20 MEQ PO TBCR
40.0000 meq | EXTENDED_RELEASE_TABLET | Freq: Every day | ORAL | Status: DC
  Administered 2011-10-04: 40 meq via ORAL
  Filled 2011-10-04: qty 2

## 2011-10-04 MED ORDER — POLYETHYLENE GLYCOL 3350 17 G PO PACK: 17.0000 g | PACK | Freq: Every day | ORAL | Status: AC | PRN

## 2011-10-04 MED ORDER — ENSURE PUDDING PO PUDG: 1.0000 | Freq: Three times a day (TID) | ORAL | Status: DC

## 2011-10-04 MED ORDER — METHOCARBAMOL 500 MG PO TABS: 500.0000 mg | ORAL_TABLET | Freq: Four times a day (QID) | ORAL | Status: AC | PRN

## 2011-10-04 MED ORDER — RIVAROXABAN 10 MG PO TABS: 10.0000 mg | ORAL_TABLET | Freq: Every day | ORAL | Status: DC

## 2011-10-04 MED ORDER — ENSURE PUDDING PO PUDG
1.0000 | Freq: Three times a day (TID) | ORAL | Status: DC
  Administered 2011-10-04: 1 via ORAL
  Filled 2011-10-04 (×2): qty 1

## 2011-10-04 MED ORDER — DSS 100 MG PO CAPS: 100.0000 mg | ORAL_CAPSULE | Freq: Two times a day (BID) | ORAL | Status: AC

## 2011-10-04 MED ORDER — POLYSACCHARIDE IRON COMPLEX 150 MG PO CAPS: 150.0000 mg | ORAL_CAPSULE | Freq: Every day | ORAL | Status: DC

## 2011-10-04 MED ORDER — HYDROCODONE-ACETAMINOPHEN 5-325 MG PO TABS: 1.0000 | ORAL_TABLET | ORAL | Status: AC | PRN

## 2011-10-04 MED ORDER — ACETAMINOPHEN 325 MG PO TABS: 650.0000 mg | ORAL_TABLET | Freq: Four times a day (QID) | ORAL | Status: DC | PRN

## 2011-10-04 NOTE — Discharge Summary (Signed)
Physician Discharge Summary   Patient ID: [name] MRN: [id number] DOB/AGE: 05-Oct-1940 71 y.o.  Admit date: 10/01/2011 Discharge date: 10/04/2011  Primary Diagnosis: Osteoarthritis Right Hip  Admission Diagnoses: Past Medical History  Diagnosis Date  . HYPERTHYROIDISM 03/11/2007  . Irritable bowel syndrome 05/19/2009  . HYPERLIPIDEMIA 03/11/2007  . ANEMIA 05/19/2009  . VITAMIN D DEFICIENCY 05/19/2009  . WEIGHT LOSS 03/11/2007  . HYPOKALEMIA 05/19/2009  . Diarrhea 05/19/2009  . ARTHRITIS, HIP 05/19/2009  . ANXIETY DEPRESSION 05/19/2009  . COLITIS 05/22/2007  . UTI 03/11/2007  . Palpitations 10/24/2010  . Lymphadenopathy 11/01/2010  . Chest pain 10/24/2010  . Emphysema 11/01/2010  . History of chicken pox   . History of measles   . H. pylori infection 11/15/2010  . Shingles 11/15/2010  . Urticaria 11/15/2010  . Postherpetic neuralgia 11/23/2010  . Preoperative clearance 08/16/2011  . COPD (chronic obstructive pulmonary disease)   . Arthritis     right hip     Discharge Diagnoses:  Principal Problem:  *Osteoarthritis of hip Active Problems:  Postop Acute blood loss anemia  Postop Hyponatremia  Postop Hypokalemia   Procedure: Procedure(s) (LRB): TOTAL HIP ARTHROPLASTY (Right)   Consults: None  HPI: [name] is a 71 y.o. female with end stage arthritis of her right hip with progressively worsening pain and dysfunction. Pain occurs with activity and rest including pain at night. She has tried analgesics, protected weight bearing and rest without benefit. Pain is too severe to attempt physical therapy. Radiographs demonstrate bone on bone arthritis with subchondral cyst formation. She presents now for right THA.  Laboratory Data: Hospital Outpatient Visit on 09/24/2011  Component Date Value Range Status  . aPTT (seconds) 09/24/2011 30  24-37 Final  . WBC (K/uL) 09/24/2011 6.4  4.0-10.5 Final  . RBC (MIL/uL) 09/24/2011 4.33  3.87-5.11 Final  . Hemoglobin (g/dL)  14/78/2956 21.3  08.6-57.8 Final  . HCT (%) 09/24/2011 41.4  36.0-46.0 Final  . MCV (fL) 09/24/2011 95.6  78.0-100.0 Final  . MCH (pg) 09/24/2011 33.0  26.0-34.0 Final  . MCHC (g/dL) 46/96/2952 84.1  32.4-40.1 Final  . RDW (%) 09/24/2011 11.7  11.5-15.5 Final  . Platelets (K/uL) 09/24/2011 234  150-400 Final  . Sodium (mEq/L) 09/24/2011 136  135-145 Final  . Potassium (mEq/L) 09/24/2011 4.1  3.5-5.1 Final  . Chloride (mEq/L) 09/24/2011 99  96-112 Final  . CO2 (mEq/L) 09/24/2011 29  19-32 Final  . Glucose, Bld (mg/dL) 02/72/5366 89  44-03 Final  . BUN (mg/dL) 47/42/5956 8  3-87 Final  . Creatinine, Ser (mg/dL) 56/43/3295 1.88  4.16-6.06 Final  . Calcium (mg/dL) 30/16/0109 9.8  3.2-35.5 Final  . Total Protein (g/dL) 73/22/0254 7.1  2.7-0.6 Final  . Albumin (g/dL) 23/76/2831 4.5  5.1-7.6 Final  . AST (U/L) 09/24/2011 19  0-37 Final  . ALT (U/L) 09/24/2011 12  0-35 Final  . Alkaline Phosphatase (U/L) 09/24/2011 69  39-117 Final  . Total Bilirubin (mg/dL) 16/01/3709 0.6  6.2-6.9 Final  . GFR calc non Af Amer (mL/min) 09/24/2011 >90  >90 Final  . GFR calc Af Amer (mL/min) 09/24/2011 >90  >90 Final   Comment:                                 The eGFR has been calculated                          using the CKD  EPI equation.                          This calculation has not been                          validated in all clinical                          situations.                          eGFR's persistently                          <90 mL/min signify                          possible Chronic Kidney Disease.  Marland Kitchen Prothrombin Time (seconds) 09/24/2011 13.5  11.6-15.2 Final  . INR  09/24/2011 1.01  0.00-1.49 Final  . Color, Urine  09/24/2011 YELLOW  YELLOW Final  . APPearance  09/24/2011 CLEAR  CLEAR Final  . Specific Gravity, Urine  09/24/2011 1.010  1.005-1.030 Final  . pH  09/24/2011 6.5  5.0-8.0 Final  . Glucose, UA (mg/dL) 16/04/9603 NEGATIVE  NEGATIVE Final  . Hgb urine dipstick   09/24/2011 NEGATIVE  NEGATIVE Final  . Bilirubin Urine  09/24/2011 NEGATIVE  NEGATIVE Final  . Ketones, ur (mg/dL) 54/03/8118 NEGATIVE  NEGATIVE Final  . Protein, ur (mg/dL) 14/78/2956 NEGATIVE  NEGATIVE Final  . Urobilinogen, UA (mg/dL) 21/30/8657 0.2  8.4-6.9 Final  . Nitrite  09/24/2011 NEGATIVE  NEGATIVE Final  . Leukocytes, UA  09/24/2011 NEGATIVE  NEGATIVE Final   MICROSCOPIC NOT DONE ON URINES WITH NEGATIVE PROTEIN, BLOOD, LEUKOCYTES, NITRITE, OR GLUCOSE <1000 mg/dL.  Marland Kitchen MRSA, PCR  09/24/2011 NEGATIVE  NEGATIVE Final  . Staphylococcus aureus  09/24/2011 NEGATIVE  NEGATIVE Final   Comment:                                 The Xpert SA Assay (FDA                          approved for NASAL specimens                          only), is one component of                          a comprehensive surveillance                          program.  It is not intended                          to diagnose infection nor to                          guide or monitor treatment.    Basename 10/04/11 0426 10/03/11 0432 10/02/11 0407  HGB 8.8* 8.3* 8.7*    Basename 10/04/11 0426 10/03/11 0432  WBC 5.3 5.2  RBC 2.65* 2.51*  HCT 25.1* 23.8*  PLT 157 139*    Basename 10/04/11 0426 10/03/11 0432  NA 133* 134*  K 3.4* 3.5  CL 98 100  CO2 28 32  BUN 6 6  CREATININE 0.46* 0.60  GLUCOSE 89 92  CALCIUM 8.3* 8.5   No results found for this basename: LABPT:2,INR:2 in the last 72 hours  X-Rays:X-ray Hip Right Ap And Lateral  09/24/2011  *RADIOLOGY REPORT*  Clinical Data: Preop for right total hip arthroplasty.  RIGHT HIP - COMPLETE 2+ VIEW  Comparison: None  Findings: There is severe right hip joint degenerative disease with marked joint space narrowing, osteophytic spurring and subchondral cystic change.  The left hip is normal.  The pubic symphysis and SI joints are intact.  IMPRESSION: Severe right hip joint degenerative disease.  Original Report Authenticated By: P. Loralie Champagne, M.D.   Dg  Pelvis Portable  10/01/2011  *RADIOLOGY REPORT*  Clinical Data: Right hip replacement  PORTABLE PELVIS  Comparison: None.  Findings: There has been total hip replacement on the right. Components appear grossly well positioned.  Soft tissue drains are in place.  No radiographically detectable complication.  IMPRESSION: Total hip replacement on the right.  Original Report Authenticated By: Thomasenia Sales, M.D.   Dg Hip Portable 1 View Right  10/01/2011  *RADIOLOGY REPORT*  Clinical Data: Hip replacement  PORTABLE RIGHT HIP - 1 VIEW  Comparison: Same day  Findings: A total hip replacement has been performed on the right. Soft tissue drains are in place.  No radiographically detectable complication.  IMPRESSION: Total hip replacement on the right.  Original Report Authenticated By: Thomasenia Sales, M.D.    EKG: Orders placed during the hospital encounter of 09/24/11  . EKG 12-LEAD  . EKG 12-LEAD     Hospital Course: Patient was admitted to [facility] and taken to the OR and underwent the above state procedure without complications.  Patient tolerated the procedure well and was later transferred to the recovery room and then to the orthopaedic floor for postoperative care.  They were given PO and IV analgesics for pain control following their surgery.  They were given 24 hours of postoperative antibiotics and started on DVT prophylaxis.   PT and OT were ordered for total joint protocol.  Discharge planning consulted to help with postop disposition and equipment needs.  Patient had a tough night on the evening of surgery but started to get up with therapy on day one.  Hemovac drain was pulled without difficulty.  Continued to progress with therapy into day two walking about 50 feet.  Dressing was changed on day two and the incision was healing well.  By day three, the patient had progressed with therapy and meeting goals.  Incision was healing well.  Patient was seen in rounds and was ready to go to  [facility].  [place] on final bed availability.  Discharge Medications: Prior to Admission medications   Medication Sig Start Date End Date Taking? Authorizing Provider  acetaminophen (TYLENOL) 325 MG tablet Take 2 tablets (650 mg total) by mouth every 6 (six) hours as needed (or Fever >/= 101). 10/04/11 10/03/12  Gaddiel Cullens, PA  albuterol (PROVENTIL,VENTOLIN) 90 MCG/ACT inhaler Inhale 2 puffs into the lungs every 6 (six) hours as needed for wheezing. 11/15/10 11/10/11  Danise Edge, MD  ALPRAZolam Prudy Feeler) 1 MG tablet Take 1 mg by mouth 2 (two) times daily. 1/2 tab po daily prn anxiety, palpitations, 1/2 to 1 tab po qhs prn insomnia, anxiety 08/16/11   Misty Stanley  Abner Greenspan, MD  bisacodyl (DULCOLAX) 10 MG suppository Place 1 suppository (10 mg total) rectally daily as needed. 10/04/11 10/14/11  Lyal Husted, PA  docusate sodium 100 MG CAPS Take 100 mg by mouth 2 (two) times daily. 10/04/11 10/14/11  Hassie Mandt, PA  feeding supplement (ENSURE) PUDG Take 1 Container by mouth 3 (three) times daily with meals. 10/04/11   Kc Sedlak Julien Girt, PA  HYDROcodone-acetaminophen (NORCO) 5-325 MG per tablet Take 1-2 tablets by mouth every 4 (four) hours as needed. 10/04/11 10/14/11  Letisha Yera, PA  iron polysaccharides (NIFEREX) 150 MG capsule Take 1 capsule (150 mg total) by mouth daily. 10/04/11 10/03/12  Dangela How Julien Girt, PA  methocarbamol (ROBAXIN) 500 MG tablet Take 1 tablet (500 mg total) by mouth every 6 (six) hours as needed. 10/04/11 10/14/11  Finnley Lewis, PA  polyethylene glycol (MIRALAX / GLYCOLAX) packet Take 17 g by mouth daily as needed. 10/04/11 10/07/11  Edrick Whitehorn Julien Girt, PA  rivaroxaban (XARELTO) 10 MG TABS tablet Take 1 tablet (10 mg total) by mouth daily with breakfast. Take for two and a half more weeks and then discontinue the Xarelto. 10/04/11   Anniah Glick Julien Girt, PA    Diet: heart healthy  Activity:PWB 25-50% No bending hip over 90 degrees- A "L" Angle Do not cross  legs Do not let foot roll inward  When turning these patients a pillow should be placed between the patient's legs to prevent crossing.  Patients should have the affected knee fully extended when trying to sit or stand from all surfaces to prevent excessive hip flexion.  When ambulating and turning toward the affected side the affected leg should have the toes turned out prior to moving the walker and the rest of patient's body as to prevent internal rotation/ turning in of the leg.  Abduction pillows are the most effective way to prevent a patient from not crossing legs or turning toes in at rest. If an abduction pillow is not ordered placing a regular pillow length wise between the patient's legs is also an effective reminder.  It is imperative that these precautions be maintained so that the surgical hip does not dislocate.    Follow-up:in 2 weeks  Disposition: [facility]  Discharged Condition: good   Discharge Orders    Future Appointments: Provider: Department: Dept Phone: Center:   01/15/2012 9:30 AM Danise Edge, MD [facility] [phone] None     Future Orders Please Complete By Expires   Diet - low sodium heart healthy      Call MD / Call 911      Comments:   If you experience chest pain or shortness of breath, CALL 911 and be transported to the hospital emergency room.  If you develope a fever above 101 F, pus (white drainage) or increased drainage or redness at the wound, or calf pain, call your surgeon's office.   Constipation Prevention      Comments:   Drink plenty of fluids.  Prune juice may be helpful.  You may use a stool softener, such as Colace (over the counter) 100 mg twice a day.  Use MiraLax (over the counter) for constipation as needed.   Increase activity slowly as tolerated      Weight Bearing as taught in Physical Therapy      Comments:   Use a walker or crutches as instructed.   Discharge instructions      Comments:   Pick up stool softner and  laxative for home. Do not submerge incision under water. May  shower. Continue to use ice for pain and swelling from surgery. Hip precautions.  Total Hip Protocol.   Driving restrictions      Comments:   No driving   Lifting restrictions      Comments:   No lifting   Follow the hip precautions as taught in Physical Therapy      Change dressing      Comments:   You may change your dressing  daily with sterile 4 x 4 inch gauze dressing and paper tape.   TED hose      Comments:   Use stockings (TED hose) for 3 weeks on both leg(s).  You may remove them at night for sleeping.     Medication List  As of 10/04/2011  9:27 AM   STOP taking these medications         mulitivitamin with minerals Tabs      Vitamin D 1000 UNITS capsule         TAKE these medications         acetaminophen 325 MG tablet   Commonly known as: TYLENOL   Take 2 tablets (650 mg total) by mouth every 6 (six) hours as needed (or Fever >/= 101).      albuterol 90 MCG/ACT inhaler   Commonly known as: PROVENTIL,VENTOLIN   Inhale 2 puffs into the lungs every 6 (six) hours as needed for wheezing.      ALPRAZolam 1 MG tablet   Commonly known as: XANAX   Take 1 mg by mouth 2 (two) times daily. 1/2 tab po daily prn anxiety, palpitations, 1/2 to 1 tab po qhs prn insomnia, anxiety      bisacodyl 10 MG suppository   Commonly known as: DULCOLAX   Place 1 suppository (10 mg total) rectally daily as needed.      DSS 100 MG Caps   Take 100 mg by mouth 2 (two) times daily.      feeding supplement Pudg   Take 1 Container by mouth 3 (three) times daily with meals.      HYDROcodone-acetaminophen 5-325 MG per tablet   Commonly known as: NORCO   Take 1-2 tablets by mouth every 4 (four) hours as needed.      iron polysaccharides 150 MG capsule   Commonly known as: NIFEREX   Take 1 capsule (150 mg total) by mouth daily.      methocarbamol 500 MG tablet   Commonly known as: ROBAXIN   Take 1 tablet (500 mg total) by  mouth every 6 (six) hours as needed.      polyethylene glycol packet   Commonly known as: MIRALAX / GLYCOLAX   Take 17 g by mouth daily as needed.      rivaroxaban 10 MG Tabs tablet   Commonly known as: XARELTO   Take 1 tablet (10 mg total) by mouth daily with breakfast. Take for two and a half more weeks and then discontinue the Xarelto.           Follow-up Information    Schedule an appointment as soon as possible for a visit in 2 weeks to follow up. (Please have facility make appointment and arragne transportaion for the patient )          Signed: Patrica Duel 10/04/2011, 9:27 AM

## 2011-10-04 NOTE — Progress Notes (Signed)
Physical Therapy Treatment Patient Details Name: [name] MRN: [id number] DOB: 03/13/1941 Today's Date: 10/04/2011  PT Assessment/Plan  PT - Assessment/Plan Comments on Treatment Session: Pt continues making slow progress with ambulation distances.  Ready for D/C today to [facility].  PT Plan: Discharge plan remains appropriate PT Frequency: 7X/week Follow Up Recommendations: Skilled nursing facility Equipment Recommended: Defer to next venue PT Goals  Acute Rehab PT Goals PT Goal Formulation: With patient Time For Goal Achievement: 7 days Pt will go Supine/Side to Sit: with supervision PT Goal: Supine/Side to Sit - Progress: Progressing toward goal Pt will go Sit to Stand: with supervision PT Goal: Sit to Stand - Progress: Progressing toward goal Pt will go Stand to Sit: with supervision PT Goal: Stand to Sit - Progress: Progressing toward goal Pt will Ambulate: 51 - 150 feet;with supervision;with least restrictive assistive device PT Goal: Ambulate - Progress: Progressing toward goal Pt will Perform Home Exercise Program: with supervision, verbal cues required/provided PT Goal: Perform Home Exercise Program - Progress: Progressing toward goal  PT Treatment Precautions/Restrictions  Precautions Precautions: Posterior Hip Precaution Booklet Issued: Yes (comment) Precaution Comments: Pt unable to recall hip precautions, re-educated pt.  Required Braces or Orthoses: No Restrictions Weight Bearing Restrictions: Yes RLE Weight Bearing: Partial weight bearing RLE Partial Weight Bearing Percentage or Pounds: 25-50 Mobility (including Balance) Bed Mobility Bed Mobility: Yes Supine to Sit: 4: Min assist;HOB flat Supine to Sit Details (indicate cue type and reason): Min assist for RLE off of bed with cues for hand placement.  Transfers Transfers: Yes Sit to Stand: 4: Min assist;With upper extremity assist;From elevated surface;From bed Sit to Stand Details (indicate cue type  and reason): Cues for hand placement on bed when standing.  Stand to Sit: 4: Min assist;With upper extremity assist;With armrests;To chair/3-in-1 Stand to Sit Details: Cues for hand placement and RLE management.  Ambulation/Gait Ambulation/Gait: Yes Ambulation/Gait Assistance: 4: Min assist Ambulation/Gait Assistance Details (indicate cue type and reason): cues for sequencing/technique, pt demos improved technique as she is not veering as much as noted yesterday.  Ambulation Distance (Feet): 60 Feet Assistive device: Rolling walker Gait Pattern: Step-to pattern;Decreased stance time - right;Decreased step length - left;Trunk flexed Gait velocity: decreased    Exercise  Total Joint Exercises Ankle Circles/Pumps: AROM;Both;20 reps;Seated Quad Sets: AROM;Right;10 reps;Seated Short Arc Quad: AROM;Right;10 reps;Seated Heel Slides: AAROM;Left;10 reps;Seated Hip ABduction/ADduction: AAROM;Right;10 reps;Seated End of Session PT - End of Session Activity Tolerance: Patient tolerated treatment well Patient left: in chair;with call bell in reach;with family/visitor present Nurse Communication: Mobility status for transfers;Mobility status for ambulation General Behavior During Session: [facility] for tasks performed Cognition: [facility] for tasks performed  Page, Meribeth Mattes 10/04/2011, 11:11 AM

## 2011-10-04 NOTE — Progress Notes (Signed)
Pt for d/c to SNF[facility]. IV d/c'd. Dressing changed & CDI to R hip. D/C instructions & RX given to pt & family members with verbalized understanding. No changes in am assessments today. Pt still with less appetite. Ensure pudding given to pt but she stated her refusal to take it after a few bite of it. K+ 40 meq given as ordered. Awaiting for transport to SNF at this time.

## 2011-10-04 NOTE — Discharge Instructions (Signed)
Hip Rehabilitation, Guidelines Following Surgery The results of a hip operation are greatly improved after range of motion and muscle strengthening exercises. Follow all safety measures which are given to protect your hip. If any of these exercises cause increased pain or swelling in your joint, decrease the amount until you are comfortable again. Then slowly increase the exercises. Call your caregiver if you have problems or questions. HOME CARE INSTRUCTIONS  Most of the following instructions are designed to prevent the dislocation of your new hip.  Do not put on socks or shoes without following the instructions of your caregivers.   Sit on high chairs so your hips are not bent more than 90 degrees.   Sit on chairs with arms. Use the chair arms to help push yourself up when arising.   Keep your leg on the side of the operation out in front of you when standing up.   Arrange for the use of a toilet seat elevator so you are not sitting low.   Do not do any exercises or get in any positions that cause your toes to point in (pigeon toed).   Always sleep with a pillow between your legs. Do not lie on your side in sleep with both knees touching the bed.   You may resume a sexual relationship in one month or when given the OK by your caregiver.   Use crutches or walker as long as suggested by your caregivers.   Begin weight bearing with your caregiver's approval.   Avoid periods of inactivity such as sitting longer than an hour when not asleep. This helps prevent blood clots.   Return to work as instructed by your caregiver.   Do not drive a car for 6 weeks or as instructed.   Do not drive while taking narcotics.   Wear elastic stockings until instructed not to.   Make sure you keep all of your appointments after your operation with all of your doctors and caregivers.  RANGE OF MOTION AND STRENGTHENING EXERCISES  These exercises are designed to help you keep full movement of your hip  joint. Follow your caregiver's or physical therapist's instructions. Perform all exercises about fifteen times, three times per day or as directed. Exercise both hips, even if you have had only one joint replacement. These exercises can be done on a training (exercise) mat, on the floor, on a table or on a bed. Use whatever works the best and is most comfortable for you. Use music or television while you are exercising so that the exercises are a pleasant break in your day. This will make your life better with the exercises acting as a break in routine you can look forward to..  Document Released: 02/17/2004 Document Revised: 07/05/2011 Document Reviewed: 02/04/2008 [employer] Patient Information 2012 [employer].  Pick up stool softner and laxative for home. Do not submerge incision under water. May shower. Continue to use ice for pain and swelling from surgery. Hip precautions.  Total Hip Protocol. 

## 2011-10-04 NOTE — Progress Notes (Signed)
Subjective: 3 Days Post-Op Procedure(s) (LRB): TOTAL HIP ARTHROPLASTY (Right) Patient reports pain as mild but she is felling better today. Patient seen in rounds with Dr. Lequita Halt. Patient is planning to go to SNF.  Will likely transfer there today - [facility]  Objective: Vital signs in last 24 hours: Temp:  [99.3 F (37.4 C)-99.6 F (37.6 C)] 99.6 F (37.6 C) (03/07 0530) Pulse Rate:  [87-98] 98  (03/07 0530) Resp:  [16] 16  (03/07 0530) BP: (97-109)/(49-63) 109/63 mmHg (03/07 0530) SpO2:  [95 %-96 %] 96 % (03/07 0530)  Intake/Output from previous day:  Intake/Output Summary (Last 24 hours) at 10/04/11 0910 Last data filed at 10/04/11 0650  Gross per 24 hour  Intake 487.33 ml  Output    300 ml  Net 187.33 ml    Intake/Output this shift:    Labs: Results for orders placed during the hospital encounter of 10/01/11  TYPE AND SCREEN      Component Value Range   ABO/RH(D) O POS     Antibody Screen NEG     Sample Expiration 10/04/2011    ABO/RH      Component Value Range   ABO/RH(D) O POS    CBC      Component Value Range   WBC 7.1  4.0 - 10.5 (K/uL)   RBC 2.61 (*) 3.87 - 5.11 (MIL/uL)   Hemoglobin 8.7 (*) 12.0 - 15.0 (g/dL)   HCT 16.1 (*) 09.6 - 46.0 (%)   MCV 94.6  78.0 - 100.0 (fL)   MCH 33.3  26.0 - 34.0 (pg)   MCHC 35.2  30.0 - 36.0 (g/dL)   RDW 04.5  40.9 - 81.1 (%)   Platelets 166  150 - 400 (K/uL)  BASIC METABOLIC PANEL      Component Value Range   Sodium 132 (*) 135 - 145 (mEq/L)   Potassium 3.9  3.5 - 5.1 (mEq/L)   Chloride 99  96 - 112 (mEq/L)   CO2 28  19 - 32 (mEq/L)   Glucose, Bld 163 (*) 70 - 99 (mg/dL)   BUN 7  6 - 23 (mg/dL)   Creatinine, Ser 9.14  0.50 - 1.10 (mg/dL)   Calcium 8.1 (*) 8.4 - 10.5 (mg/dL)   GFR calc non Af Amer >90  >90 (mL/min)   GFR calc Af Amer >90  >90 (mL/min)  CBC      Component Value Range   WBC 5.2  4.0 - 10.5 (K/uL)   RBC 2.51 (*) 3.87 - 5.11 (MIL/uL)   Hemoglobin 8.3 (*) 12.0 - 15.0 (g/dL)   HCT 78.2 (*) 95.6  - 46.0 (%)   MCV 94.8  78.0 - 100.0 (fL)   MCH 33.1  26.0 - 34.0 (pg)   MCHC 34.9  30.0 - 36.0 (g/dL)   RDW 21.3  08.6 - 57.8 (%)   Platelets 139 (*) 150 - 400 (K/uL)  BASIC METABOLIC PANEL      Component Value Range   Sodium 134 (*) 135 - 145 (mEq/L)   Potassium 3.5  3.5 - 5.1 (mEq/L)   Chloride 100  96 - 112 (mEq/L)   CO2 32  19 - 32 (mEq/L)   Glucose, Bld 92  70 - 99 (mg/dL)   BUN 6  6 - 23 (mg/dL)   Creatinine, Ser 4.69  0.50 - 1.10 (mg/dL)   Calcium 8.5  8.4 - 62.9 (mg/dL)   GFR calc non Af Amer >90  >90 (mL/min)   GFR calc Af Amer >  90  >90 (mL/min)  CBC      Component Value Range   WBC 5.3  4.0 - 10.5 (K/uL)   RBC 2.65 (*) 3.87 - 5.11 (MIL/uL)   Hemoglobin 8.8 (*) 12.0 - 15.0 (g/dL)   HCT 11.9 (*) 14.7 - 46.0 (%)   MCV 94.7  78.0 - 100.0 (fL)   MCH 33.2  26.0 - 34.0 (pg)   MCHC 35.1  30.0 - 36.0 (g/dL)   RDW 82.9  56.2 - 13.0 (%)   Platelets 157  150 - 400 (K/uL)  BASIC METABOLIC PANEL      Component Value Range   Sodium 133 (*) 135 - 145 (mEq/L)   Potassium 3.4 (*) 3.5 - 5.1 (mEq/L)   Chloride 98  96 - 112 (mEq/L)   CO2 28  19 - 32 (mEq/L)   Glucose, Bld 89  70 - 99 (mg/dL)   BUN 6  6 - 23 (mg/dL)   Creatinine, Ser 8.65 (*) 0.50 - 1.10 (mg/dL)   Calcium 8.3 (*) 8.4 - 10.5 (mg/dL)   GFR calc non Af Amer >90  >90 (mL/min)   GFR calc Af Amer >90  >90 (mL/min)    Exam: Neurovascular intact Sensation intact distally Incision - clean, dry, no drainage Motor function intact - moving foot and toes well on exam.   Assessment/Plan: 3 Days Post-Op Procedure(s) (LRB): TOTAL HIP ARTHROPLASTY (Right) Procedure(s) (LRB): TOTAL HIP ARTHROPLASTY (Right) Past Medical History  Diagnosis Date  . HYPERTHYROIDISM 03/11/2007  . Irritable bowel syndrome 05/19/2009  . HYPERLIPIDEMIA 03/11/2007  . ANEMIA 05/19/2009  . VITAMIN D DEFICIENCY 05/19/2009  . WEIGHT LOSS 03/11/2007  . HYPOKALEMIA 05/19/2009  . Diarrhea 05/19/2009  . ARTHRITIS, HIP 05/19/2009  . ANXIETY DEPRESSION  05/19/2009  . COLITIS 05/22/2007  . UTI 03/11/2007  . Palpitations 10/24/2010  . Lymphadenopathy 11/01/2010  . Chest pain 10/24/2010  . Emphysema 11/01/2010  . History of chicken pox   . History of measles   . H. pylori infection 11/15/2010  . Shingles 11/15/2010  . Urticaria 11/15/2010  . Postherpetic neuralgia 11/23/2010  . Preoperative clearance 08/16/2011  . COPD (chronic obstructive pulmonary disease)   . Arthritis     right hip    Principal Problem:  *Osteoarthritis of hip Active Problems:  Postop Acute blood loss anemia  Postop Hyponatremia  Postop Hypokalemia   Advance diet Up with therapy Discharge to SNF Diet - heart healthy Follow up - in 2 weeks Activity - PWB 25-50% Condition Upon Discharge - Good D/C Meds - See DC Summary DVT Prophylaxis - Xarelto Protocol   [name] 10/04/2011, 9:10 AM

## 2011-10-04 NOTE — Progress Notes (Signed)
CSW has made arrangements for Pt to go to [facility]. Pt aware and agrees. Transport via ambulance later this am. Paperwork packet to be placed in chart for transport. Vennie Homans, [place]  For Mifflin Haidnger 10/04/2011 10:36 AM [phone]

## 2011-10-15 ENCOUNTER — Encounter (HOSPITAL_COMMUNITY): Payer: Self-pay | Admitting: Orthopedic Surgery

## 2012-01-15 ENCOUNTER — Ambulatory Visit: Payer: Medicare Other | Admitting: Family Medicine

## 2012-02-05 ENCOUNTER — Other Ambulatory Visit: Payer: Self-pay | Admitting: *Deleted

## 2012-02-05 NOTE — Telephone Encounter (Signed)
Faxed refill request received from pharmacy for ALPRAZOLAM Last filled by MD on 08/16/11, #30 X 1 Last seen on 09/17/11 Follow up 01/15/12 cancelled by patient--Cancel Rsn: Patient (patient is living in [place] right now, will CB when she knows they will be back) Please advise refills.

## 2012-02-05 NOTE — Telephone Encounter (Signed)
So she can have a refill with same sig, same number #30 with 1 rf but find out if she is still in [place] and warn her we cannot continue to prescribe controlled meds if patient cannot come in for follow up so she may need to consider a PMD closer to home

## 2012-02-06 MED ORDER — ALPRAZOLAM 1 MG PO TABS: ORAL_TABLET | ORAL | Status: DC

## 2012-02-06 NOTE — Telephone Encounter (Signed)
Refill called to pharmacy.

## 2012-02-06 NOTE — Telephone Encounter (Signed)
Pt notified she is due for follow up.  She is staying in [place] 2 weeks then will come home for a couple of days.  Husband has been sick.  She will call back to schedule.  She is seeing orthopedic for hip pain/problems at this time.

## 2012-03-11 ENCOUNTER — Encounter: Payer: Self-pay | Admitting: Family Medicine

## 2012-03-11 ENCOUNTER — Ambulatory Visit (INDEPENDENT_AMBULATORY_CARE_PROVIDER_SITE_OTHER): Payer: Medicare Other | Admitting: Family Medicine

## 2012-03-11 VITALS — BP 112/71 | HR 75 | Temp 97.8°F | Ht 61.0 in | Wt 88.0 lb

## 2012-03-11 DIAGNOSIS — R630 Anorexia: Secondary | ICD-10-CM

## 2012-03-11 DIAGNOSIS — E871 Hypo-osmolality and hyponatremia: Secondary | ICD-10-CM

## 2012-03-11 DIAGNOSIS — R634 Abnormal weight loss: Secondary | ICD-10-CM

## 2012-03-11 DIAGNOSIS — F411 Generalized anxiety disorder: Secondary | ICD-10-CM

## 2012-03-11 DIAGNOSIS — M1610 Unilateral primary osteoarthritis, unspecified hip: Secondary | ICD-10-CM

## 2012-03-11 DIAGNOSIS — F419 Anxiety disorder, unspecified: Secondary | ICD-10-CM

## 2012-03-11 DIAGNOSIS — R319 Hematuria, unspecified: Secondary | ICD-10-CM

## 2012-03-11 DIAGNOSIS — F341 Dysthymic disorder: Secondary | ICD-10-CM

## 2012-03-11 DIAGNOSIS — R109 Unspecified abdominal pain: Secondary | ICD-10-CM

## 2012-03-11 LAB — CBC
HCT: 40 % (ref 36.0–46.0)
Hemoglobin: 13.3 g/dL (ref 12.0–15.0)
MCHC: 33.2 g/dL (ref 30.0–36.0)
MCV: 100.1 fl — ABNORMAL HIGH (ref 78.0–100.0)
Platelets: 209 10*3/uL (ref 150.0–400.0)
RBC: 3.99 Mil/uL (ref 3.87–5.11)
RDW: 13.2 % (ref 11.5–14.6)
WBC: 6.3 10*3/uL (ref 4.5–10.5)

## 2012-03-11 LAB — POCT URINALYSIS DIPSTICK
Bilirubin, UA: NEGATIVE
Glucose, UA: NEGATIVE
Ketones, UA: NEGATIVE
Leukocytes, UA: NEGATIVE
Nitrite, UA: NEGATIVE
Protein, UA: NEGATIVE
Spec Grav, UA: 1.015
Urobilinogen, UA: 0.2
pH, UA: 6

## 2012-03-11 MED ORDER — ALPRAZOLAM 1 MG PO TABS: 1.0000 mg | ORAL_TABLET | Freq: Two times a day (BID) | ORAL | Status: DC | PRN

## 2012-03-11 MED ORDER — CITALOPRAM HYDROBROMIDE 10 MG PO TABS: 20.0000 mg | ORAL_TABLET | Freq: Every day | ORAL | Status: DC

## 2012-03-11 NOTE — Assessment & Plan Note (Signed)
Patient is here to day to follow up on her anxiety, she only started the Citalopram 10 mg  Daily about a month ago and is feeling some what better and less shaky. She agrees to try increasing to 15 and then 20 mg daily. May continue to use Alprazolam prn

## 2012-03-11 NOTE — Progress Notes (Signed)
Patient ID: [name], female   DOB: 05/23/1941, 71 y.o.   MRN: [id number] Suheyla Mortellaro [id number] 02-09-1941 03/11/2012      Progress Note-Follow Up  Subjective  Chief Complaint  Chief Complaint  Patient presents with  . Follow-up    on anxiety    HPI  Patient is a 71 year old Caucasian female in today for follow up on her anxiety and depression. She is accompanied by her daughter. She continues to struggle with her husband's poor health. They live alone in this patient is a being the primary care for her husband. Over the last month she's had worsening trouble to right hip pain. She was seen by her orthopedist, Dr Despina Hick earlier this month and given a steroid injection. Unfortunately she'll that at today's relief and at this point is having such pain that they're afraid she'll fall. She is in possession of the walker but has not been using it. She was struggling with high levels of anxiety at her last visit and we gave her a prescription for citalopram. She did not start it until roughly a month ago. She does acknowledge that it seems to help somewhat. She feels a little less shaky and anxious. The alprazolam is helping her to sleep for the most part. She continues to struggle with poor appetite but has gained weight. She denies chest pain, palpitations, shortness of breath or GI or GU complaints. Does note feeling somewhat more shaky and sweaty at night first thing in the mornings but also acknowledges not being a significant amount in the evenings or questions whether she has low blood sugar first thing in the morning.  Past Medical History  Diagnosis Date  . HYPERTHYROIDISM 03/11/2007  . Irritable bowel syndrome 05/19/2009  . HYPERLIPIDEMIA 03/11/2007  . ANEMIA 05/19/2009  . VITAMIN D DEFICIENCY 05/19/2009  . WEIGHT LOSS 03/11/2007  . HYPOKALEMIA 05/19/2009  . Diarrhea 05/19/2009  . ARTHRITIS, HIP 05/19/2009  . ANXIETY DEPRESSION 05/19/2009  . COLITIS 05/22/2007  . UTI 03/11/2007  .  Palpitations 10/24/2010  . Lymphadenopathy 11/01/2010  . Chest pain 10/24/2010  . Emphysema 11/01/2010  . History of chicken pox   . History of measles   . H. pylori infection 11/15/2010  . Shingles 11/15/2010  . Urticaria 11/15/2010  . Postherpetic neuralgia 11/23/2010  . Preoperative clearance 08/16/2011  . COPD (chronic obstructive pulmonary disease)   . Arthritis     right hip     Past Surgical History  Procedure Date  . Abdominal hysterectomy     total  . Appendectomy   . Other surgical history     bilateral breat biopsy wtih- fibrocystic breast disease   . Total hip arthroplasty 10/01/2011    Procedure: TOTAL HIP ARTHROPLASTY;  Surgeon: Loanne Drilling, MD;  Location: [facility];  Service: Orthopedics;  Laterality: Right;    Family History  Problem Relation Age of Onset  . Alzheimer's disease Mother   . Other Father     blood clot  . Pulmonary embolism Father   . Cancer Sister 29    breast  . Emphysema Sister   . Cancer Maternal Aunt     breast  . Heart attack Paternal Grandfather   . Cancer Sister     bladder ca  . Emphysema Sister   . Cancer Sister     breast  . Emphysema Sister   . GER disease Brother   . Arthritis Sister     History   Social History  . Marital  Status: Married    Spouse Name: N/A    Number of Children: N/A  . Years of Education: N/A   Occupational History  . Not on file.   Social History Main Topics  . Smoking status: Never Smoker   . Smokeless tobacco: Never Used  . Alcohol Use: No  . Drug Use: No  . Sexually Active: No   Other Topics Concern  . Not on file   Social History Narrative  . No narrative on file    Current Outpatient Prescriptions on File Prior to Visit  Medication Sig Dispense Refill  . citalopram (CELEXA) 10 MG tablet Take 2 tablets (20 mg total) by mouth daily.  60 tablet  2  . albuterol (PROVENTIL,VENTOLIN) 90 MCG/ACT inhaler Inhale 2 puffs into the lungs every 6 (six) hours as needed for wheezing.  17 g  12  .  feeding supplement (ENSURE) PUDG Take 1 Container by mouth 3 (three) times daily with meals.  21 Container  0    Allergies  Allergen Reactions  . Tramadol Hives    Review of Systems  Review of Systems  Constitutional: Negative for fever and malaise/fatigue.  HENT: Negative for congestion.   Eyes: Negative for discharge.  Respiratory: Negative for shortness of breath.   Cardiovascular: Negative for chest pain, palpitations and leg swelling.  Gastrointestinal: Negative for nausea, abdominal pain and diarrhea.  Genitourinary: Negative for dysuria.  Musculoskeletal: Positive for joint pain. Negative for falls.       Right hip pain  Skin: Negative for rash.  Neurological: Negative for loss of consciousness and headaches.  Endo/Heme/Allergies: Negative for polydipsia.  Psychiatric/Behavioral: Positive for depression. Negative for suicidal ideas. The patient is nervous/anxious. The patient does not have insomnia.     Objective  BP 112/71  Pulse 75  Temp 97.8 F (36.6 C) (Temporal)  Ht 5\' 1"  (1.549 m)  Wt 88 lb (39.917 kg)  BMI 16.63 kg/m2  SpO2 97%  Physical Exam  Physical Exam  Lab Results  Component Value Date   TSH 0.75 08/16/2011   Lab Results  Component Value Date   WBC 5.3 10/04/2011   HGB 8.8* 10/04/2011   HCT 25.1* 10/04/2011   MCV 94.7 10/04/2011   PLT 157 10/04/2011   Lab Results  Component Value Date   CREATININE 0.46* 10/04/2011   BUN 6 10/04/2011   NA 133* 10/04/2011   K 3.4* 10/04/2011   CL 98 10/04/2011   CO2 28 10/04/2011   Lab Results  Component Value Date   ALT 12 09/24/2011   AST 19 09/24/2011   ALKPHOS 69 09/24/2011   BILITOT 0.6 09/24/2011   Lab Results  Component Value Date   CHOL 203* 08/16/2011   Lab Results  Component Value Date   HDL 67.10 08/16/2011   Lab Results  Component Value Date   LDLCALC  Value: 126        Total Cholesterol/HDL:CHD Risk Coronary Heart Disease Risk Table                     Men   Women  1/2 Average Risk   3.4   3.3  Average  Risk       5.0   4.4  2 X Average Risk   9.6   7.1  3 X Average Risk  23.4   11.0        Use the calculated Patient Ratio above and the CHD Risk Table to determine the patient's CHD Risk.  ATP III CLASSIFICATION (LDL):  <100     mg/dL   Optimal  478-295  mg/dL   Near or Above                    Optimal  130-159  mg/dL   Borderline  621-308  mg/dL   High  >657     mg/dL   Very High* 8/46/9629   Lab Results  Component Value Date   TRIG 58.0 08/16/2011   Lab Results  Component Value Date   CHOLHDL 3 08/16/2011     Assessment & Plan  ANXIETY DEPRESSION Patient is here to day to follow up on her anxiety, she only started the Citalopram 10 mg  Daily about a month ago and is feeling some what better and less shaky. She agrees to try increasing to 15 and then 20 mg daily. May continue to use Alprazolam prn  ARTHRITIS, HIP Now with Bursitis. Is following with Dr Despina Hick and has recently had a steroid injection which only gave her 2 days worth of relief. She is going to call ortho and have them consider the arthroscopy, her pain is severe. She is encouraged to use her walker as much as possible for now.  WEIGHT LOSS Has had some decent weight gain since her last visit, she is encouraged to continue trying to eat regular meals and we will monitor her weight

## 2012-03-11 NOTE — Assessment & Plan Note (Signed)
Now with Bursitis. Is following with Dr Despina Hick and has recently had a steroid injection which only gave her 2 days worth of relief. She is going to call ortho and have them consider the arthroscopy, her pain is severe. She is encouraged to use her walker as much as possible for now.

## 2012-03-11 NOTE — Assessment & Plan Note (Signed)
Has had some decent weight gain since her last visit, she is encouraged to continue trying to eat regular meals and we will monitor her weight

## 2012-03-11 NOTE — Patient Instructions (Addendum)

## 2012-03-12 LAB — RENAL FUNCTION PANEL
Albumin: 4.4 g/dL (ref 3.5–5.2)
BUN: 14 mg/dL (ref 6–23)
CO2: 29 mEq/L (ref 19–32)
Calcium: 9.1 mg/dL (ref 8.4–10.5)
Chloride: 100 mEq/L (ref 96–112)
Creatinine, Ser: 0.6 mg/dL (ref 0.4–1.2)
GFR: 100.88 mL/min (ref >60.00)
Glucose, Bld: 80 mg/dL (ref 70–99)
Phosphorus: 3.3 mg/dL (ref 2.3–4.6)
Potassium: 3.8 mEq/L (ref 3.5–5.1)
Sodium: 136 mEq/L (ref 135–145)

## 2012-03-12 LAB — HEPATIC FUNCTION PANEL
ALT: 20 U/L (ref 0–35)
AST: 26 U/L (ref 0–37)
Albumin: 4.4 g/dL (ref 3.5–5.2)
Alkaline Phosphatase: 55 U/L (ref 39–117)
Bilirubin, Direct: 0.1 mg/dL (ref 0.0–0.3)
Total Bilirubin: 0.7 mg/dL (ref 0.3–1.2)
Total Protein: 6.7 g/dL (ref 6.0–8.3)

## 2012-03-12 LAB — TSH: TSH: 0.96 u[IU]/mL (ref 0.35–5.50)

## 2012-03-12 NOTE — Progress Notes (Signed)
Quick Note:  Patient Informed and voiced understanding ______ 

## 2012-03-13 LAB — URINE CULTURE: Colony Count: 10000

## 2012-03-13 NOTE — Progress Notes (Signed)
Quick Note:  Patient Informed and voiced understanding ______ 

## 2012-03-26 IMAGING — CR DG HIP 1V PORT*R*
1 series · 1 of 1 positions shown · non-contrast
Comparison: Same day

CLINICAL DATA: Hip replacement

PORTABLE RIGHT HIP - 1 VIEW

[AP]
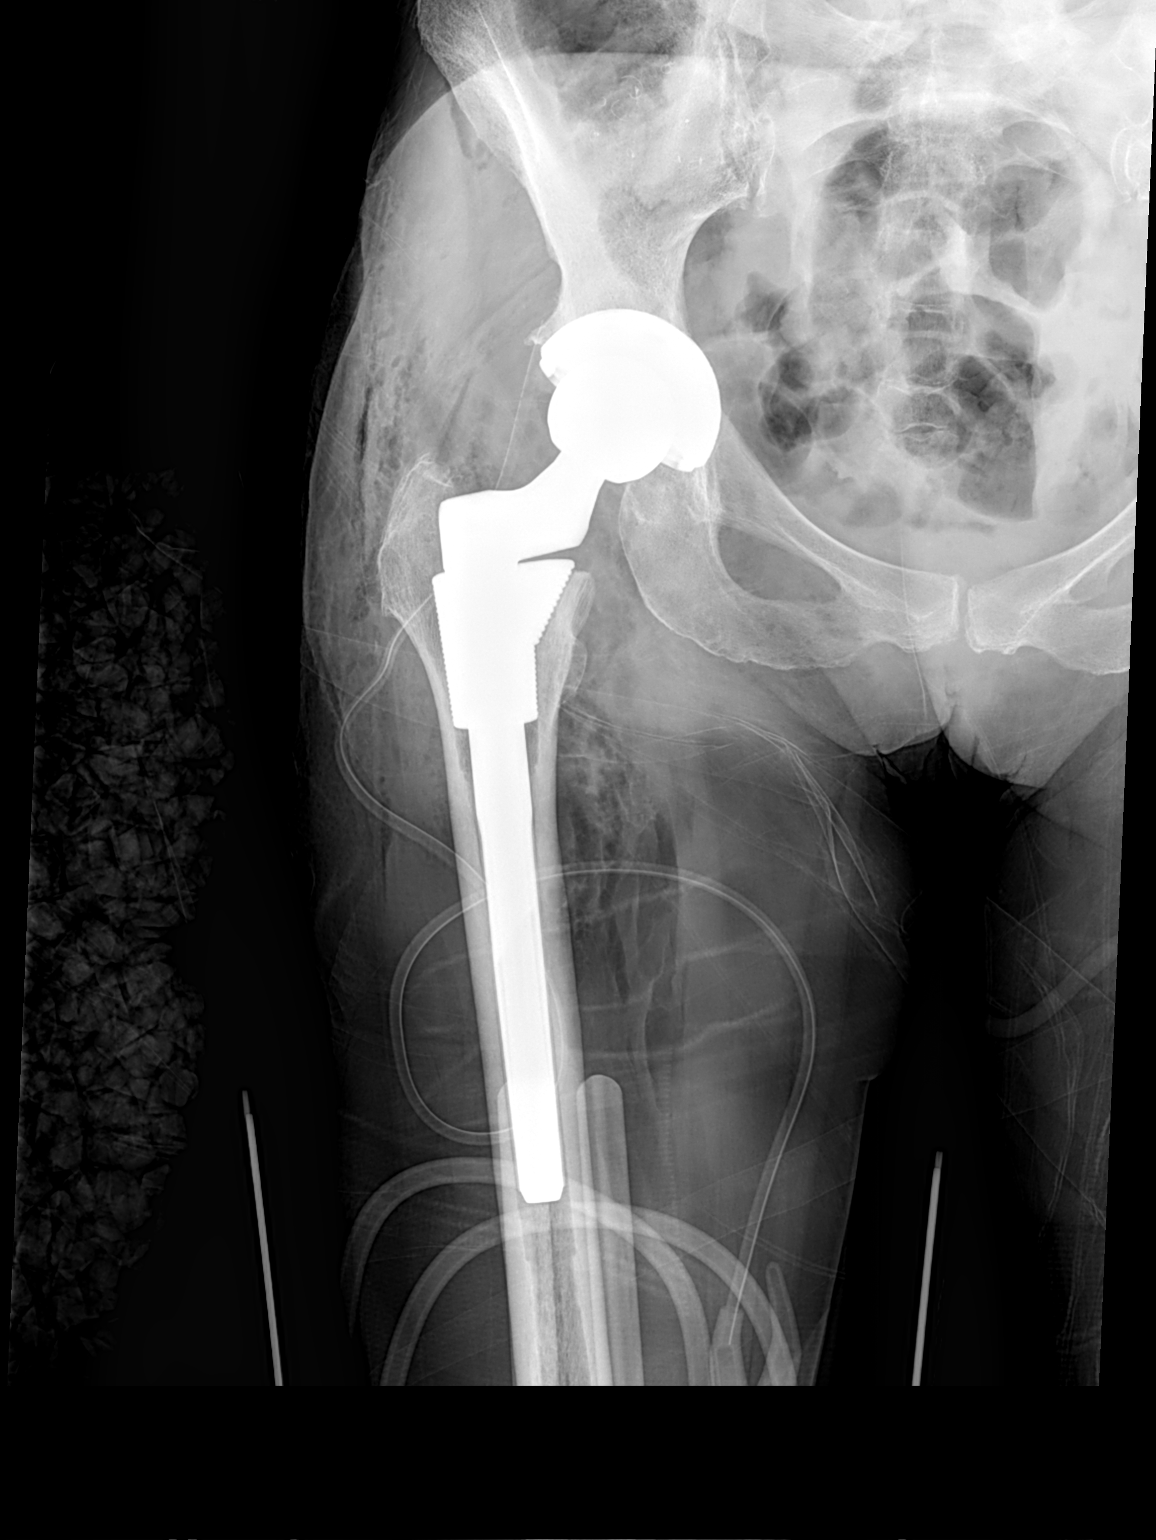

[1 of 1 positions shown; findings below may reference images not displayed]

FINDINGS: A total hip replacement has been performed on the right.
Soft tissue drains are in place.  No radiographically detectable
complication.
IMPRESSION: Total hip replacement on the right.

## 2012-03-26 IMAGING — CR DG PORTABLE PELVIS
1 series · 1 of 1 positions shown · non-contrast
Comparison: None.

CLINICAL DATA: Right hip replacement

PORTABLE PELVIS

[AP]
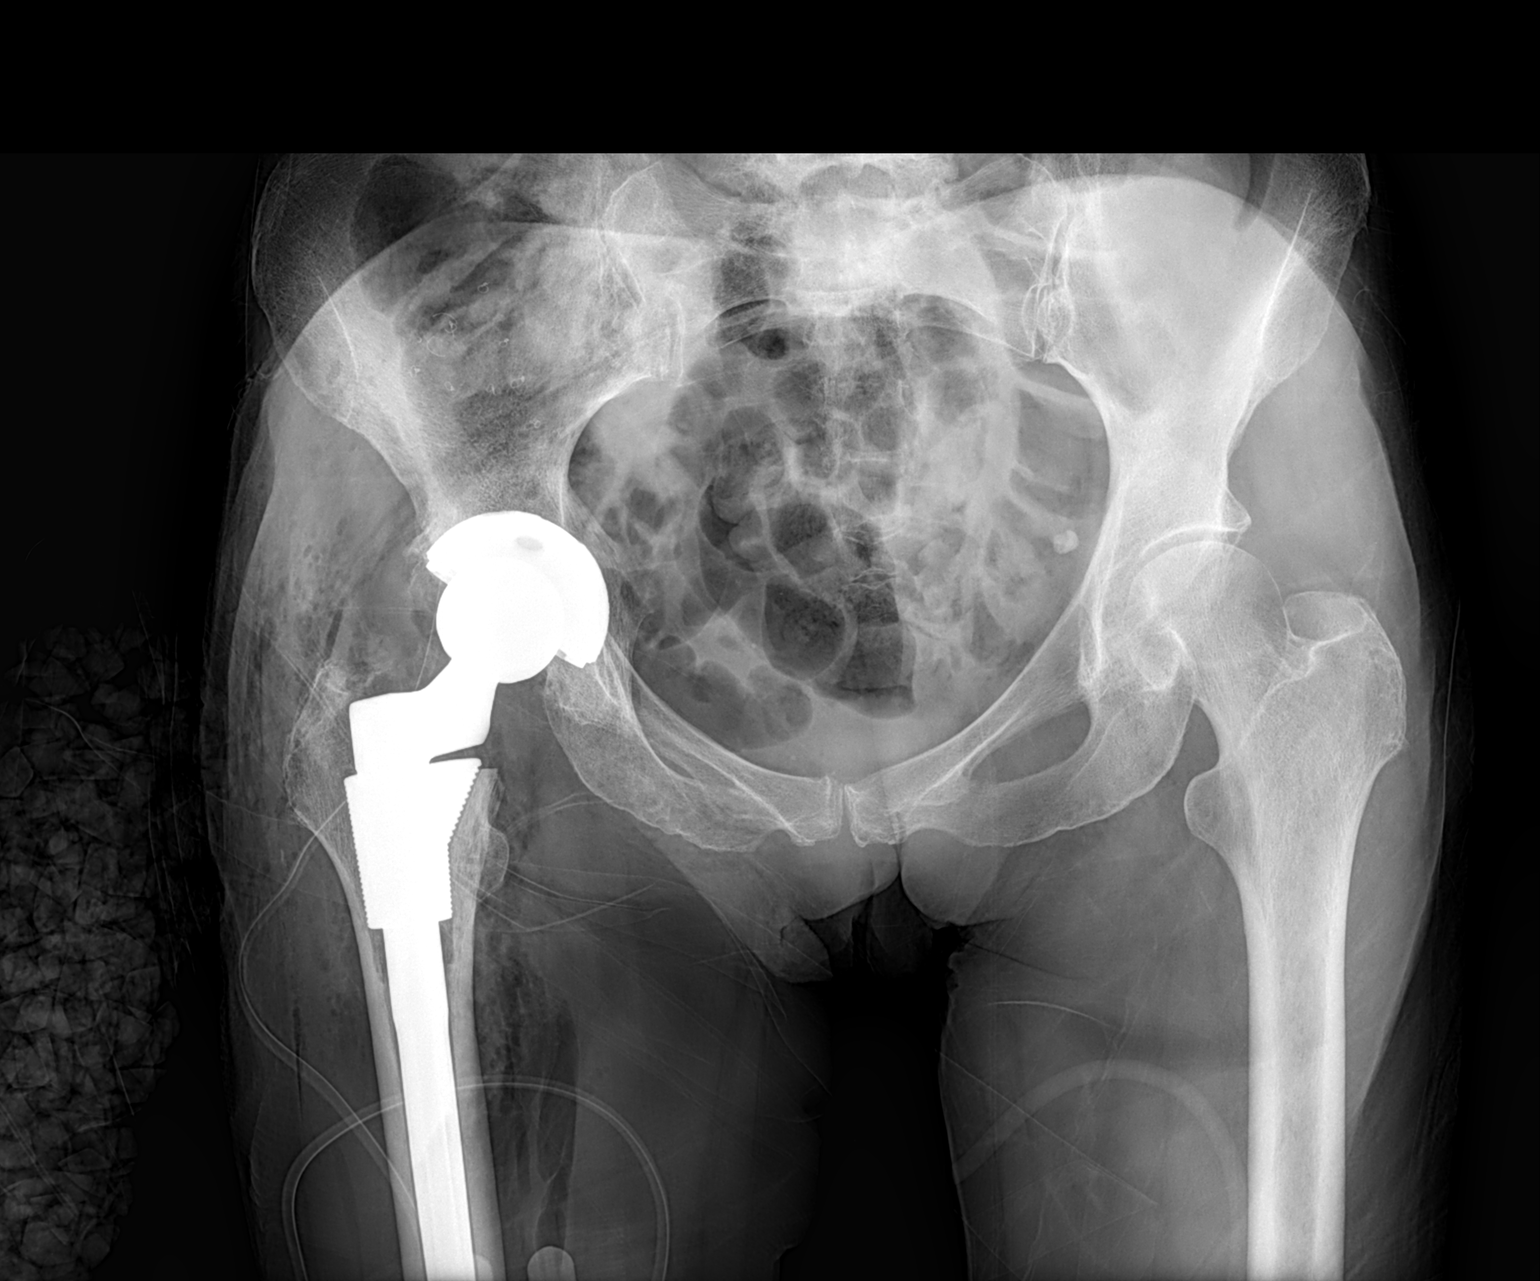

[1 of 1 positions shown; findings below may reference images not displayed]

FINDINGS: There has been total hip replacement on the right.
Components appear grossly well positioned.  Soft tissue drains are
in place.  No radiographically detectable complication.
IMPRESSION: Total hip replacement on the right.

## 2012-04-09 ENCOUNTER — Other Ambulatory Visit (HOSPITAL_COMMUNITY): Payer: Self-pay | Admitting: Orthopedic Surgery

## 2012-04-09 DIAGNOSIS — M79659 Pain in unspecified thigh: Secondary | ICD-10-CM

## 2012-04-09 DIAGNOSIS — M25551 Pain in right hip: Secondary | ICD-10-CM

## 2012-04-18 ENCOUNTER — Encounter (HOSPITAL_COMMUNITY)
Admission: RE | Admit: 2012-04-18 | Discharge: 2012-04-18 | Disposition: A | Discharge status: Home / Self Care | Payer: Medicare Other | Source: Ambulatory Visit | Attending: Orthopedic Surgery | Admitting: Orthopedic Surgery

## 2012-04-18 DIAGNOSIS — M79659 Pain in unspecified thigh: Secondary | ICD-10-CM

## 2012-04-18 DIAGNOSIS — M25559 Pain in unspecified hip: Secondary | ICD-10-CM | POA: Insufficient documentation

## 2012-04-18 DIAGNOSIS — M25551 Pain in right hip: Secondary | ICD-10-CM

## 2012-04-18 DIAGNOSIS — M79609 Pain in unspecified limb: Secondary | ICD-10-CM | POA: Insufficient documentation

## 2012-04-18 MED ORDER — TECHNETIUM TC 99M MEDRONATE IV KIT
25.0000 | PACK | Freq: Once | INTRAVENOUS | Status: AC | PRN
  Administered 2012-04-18: 25 via INTRAVENOUS

## 2012-05-19 ENCOUNTER — Ambulatory Visit (INDEPENDENT_AMBULATORY_CARE_PROVIDER_SITE_OTHER): Payer: Medicare Other | Admitting: Family Medicine

## 2012-05-19 ENCOUNTER — Encounter: Payer: Self-pay | Admitting: Family Medicine

## 2012-05-19 VITALS — BP 126/69 | HR 67 | Temp 97.4°F | Ht 61.0 in | Wt 91.8 lb

## 2012-05-19 DIAGNOSIS — D649 Anemia, unspecified: Secondary | ICD-10-CM

## 2012-05-19 DIAGNOSIS — E559 Vitamin D deficiency, unspecified: Secondary | ICD-10-CM

## 2012-05-19 DIAGNOSIS — S7290XA Unspecified fracture of unspecified femur, initial encounter for closed fracture: Secondary | ICD-10-CM

## 2012-05-19 DIAGNOSIS — S7291XA Unspecified fracture of right femur, initial encounter for closed fracture: Secondary | ICD-10-CM | POA: Insufficient documentation

## 2012-05-19 DIAGNOSIS — F411 Generalized anxiety disorder: Secondary | ICD-10-CM

## 2012-05-19 DIAGNOSIS — R634 Abnormal weight loss: Secondary | ICD-10-CM

## 2012-05-19 DIAGNOSIS — F419 Anxiety disorder, unspecified: Secondary | ICD-10-CM

## 2012-05-19 DIAGNOSIS — Z23 Encounter for immunization: Secondary | ICD-10-CM

## 2012-05-19 HISTORY — DX: Unspecified fracture of right femur, initial encounter for closed fracture: S72.91XA

## 2012-05-19 LAB — CBC WITH DIFFERENTIAL/PLATELET
Basophils Absolute: 0 10*3/uL (ref 0.0–0.1)
Basophils Relative: 0.4 % (ref 0.0–3.0)
Eosinophils Absolute: 0 10*3/uL (ref 0.0–0.7)
Eosinophils Relative: 0.4 % (ref 0.0–5.0)
HCT: 42 % (ref 36.0–46.0)
Hemoglobin: 13.9 g/dL (ref 12.0–15.0)
Lymphocytes Relative: 17.3 % (ref 12.0–46.0)
Lymphs Abs: 1 10*3/uL (ref 0.7–4.0)
MCHC: 33.2 g/dL (ref 30.0–36.0)
MCV: 100 fl (ref 78.0–100.0)
Monocytes Absolute: 0.3 10*3/uL (ref 0.1–1.0)
Monocytes Relative: 5 % (ref 3.0–12.0)
Neutro Abs: 4.6 10*3/uL (ref 1.4–7.7)
Neutrophils Relative %: 76.9 % (ref 43.0–77.0)
Platelets: 203 10*3/uL (ref 150.0–400.0)
RBC: 4.2 Mil/uL (ref 3.87–5.11)
RDW: 12.2 % (ref 11.5–14.6)
WBC: 6 10*3/uL (ref 4.5–10.5)

## 2012-05-19 LAB — RENAL FUNCTION PANEL
Albumin: 4.1 g/dL (ref 3.5–5.2)
BUN: 13 mg/dL (ref 6–23)
CO2: 29 mEq/L (ref 19–32)
Calcium: 9.2 mg/dL (ref 8.4–10.5)
Chloride: 99 mEq/L (ref 96–112)
Creatinine, Ser: 0.6 mg/dL (ref 0.4–1.2)
GFR: 102.74 mL/min (ref >60.00)
Glucose, Bld: 87 mg/dL (ref 70–99)
Phosphorus: 3.7 mg/dL (ref 2.3–4.6)
Potassium: 4.1 mEq/L (ref 3.5–5.1)
Sodium: 137 mEq/L (ref 135–145)

## 2012-05-19 MED ORDER — ALPRAZOLAM 1 MG PO TABS: 1.0000 mg | ORAL_TABLET | Freq: Two times a day (BID) | ORAL | Status: DC | PRN

## 2012-05-19 MED ORDER — CITALOPRAM HYDROBROMIDE 20 MG PO TABS: 20.0000 mg | ORAL_TABLET | Freq: Every day | ORAL | Status: DC

## 2012-05-19 NOTE — Assessment & Plan Note (Signed)
Check Vitamin D level today and start Citracal bid

## 2012-05-19 NOTE — Assessment & Plan Note (Signed)
Patient now being seen by ortho recently and she was found to have a stress fracture. 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 She is encouraged to rest as much as possible and consider a cane for decreased stress on the leg. Is using Naproxen bid and may use Tylenol up to 3000 mg in 24 hours to se eif that helps

## 2012-05-19 NOTE — Assessment & Plan Note (Signed)
Has had some weight gain since the last visit.

## 2012-05-19 NOTE — Patient Instructions (Addendum)
May use Tylenol/Acetaminophen is the medicine to use for breakthrough pain with Naproxen. Max of 3000 mg of Tylenol in 24 hours Start Citracal twice daily  Stress Fracture When too much stress is put on the foot, as in running and jumping sports, the center shaft of the bones of the forefoot is very susceptible to stress fractures (break in bones) because of thinness of this bone. This injury is more common if osteoporosis is present or if inadequate running shoes are used. Shoes should be used which adequately cushion the foot to absorb the shocks of the activity participated in. Stress fractures are very common in competitive female runners who develop these small cracks on the surface of the bones in their legs and feet. The women most likely to suffer these injuries are those who restrict food and those who have irregular periods. Stress fractures usually start out as a minor discomfort in the foot or leg. The fracture often occurs near the end of a long run. Usually the pain goes away with rest. On the next day, the pain returns earlier in the run. If an athlete notices that it hurts to touch just one spot on a bone and then stops running for a week, they can return to running quickly. But usually the pain is ignored and a stress fracture develops. The athlete now has to avoid the hard pounding of running, but can ride a bike or swim for exercise until the fracture heals in 6 to 12 weeks. The most common sites for stress fractures are the bones in the front of the feet and the long bone of the lower leg, but running can cause stress fractures anywhere, even in the pelvic bones. DIAGNOSIS  Usually the diagnosis is made by history. The bone involved progressively becomes sorer with activities. X-rays may be negative (show no break) within the first two to three weeks of the beginning of pain. A later x-ray may show signs of healing bone (callus formation). A bone scan will usually make the diagnosis  earlier. HOME CARE INSTRUCTIONS  Treatment may or may not include a cast or walking shoe. When casts are needed the use is usually for short periods of time so as not to slow down healing with muscle wasting (atrophy).  Activities should be stopped until further advised by your caregiver.  Wear shoes with adequate shock absorbing abilities.  Alternative exercise may be undertaken while waiting for healing. These may include bicycling and swimming, or as your caregiver suggests. If you do not have a cast or splint:  You may walk on your injured foot as tolerated or advised.  Do not put any weight on your injured foot until instructed. Slowly increase the amount of time you walk on the foot as the pain allows or as advised.  Use crutches until you can bear weight without pain. A gradual increase in weight bearing may help.  Apply ice to the injury for 15 to 20 minutes each hour while awake for the first 2 days. Put the ice in a plastic bag and place a towel between the bag of ice and your skin.  Only take over-the-counter or prescription medicines for pain, discomfort, or fever as directed by your caregiver.  If your caregiver has given you a follow-up appointment, it is very important to keep that appointment. Not keeping the appointment could result in a chronic or permanent injury, pain, and disability. If there is any problem keeping the appointment, you must call back to  this facility for assistance. SEEK IMMEDIATE MEDICAL CARE IF:   Pain is becoming worse rather than better, or if pain is uncontrolled with medicine.  You have increased swelling or redness in the foot. Document Released: 10/06/2002 Document Revised: 10/08/2011 Document Reviewed: 03/01/2008 [facility] Patient Information 2013 [place], [place].

## 2012-05-19 NOTE — Progress Notes (Signed)
Patient ID: [name], female   DOB: 1941-02-28, 71 y.o.   MRN: [id number] [name] [id number] 03-Oct-1940 05/19/2012      Progress Note-Follow Up  Subjective  Chief Complaint  Chief Complaint  Patient presents with  . Follow-up    2 month    HPI  Patient is a 71 year old Caucasian female who is in today for reevaluation of hip pain. She's now been seen by orthopedic confirm that she has a right femur stress fracture. She is advised to be nonweightbearing but she is unable to achieve this do to her husband's poor health. They tried tramadol but she did not tolerate that. Naproxen does help some she is taking it twice daily with food. She has difficulty finding a comfortable position. No new concerns or complaints. No chest pain, palpitations, shortness of breath, GI or GU complaints.  Past Medical History  Diagnosis Date  . HYPERTHYROIDISM 03/11/2007  . Irritable bowel syndrome 05/19/2009  . HYPERLIPIDEMIA 03/11/2007  . ANEMIA 05/19/2009  . VITAMIN D DEFICIENCY 05/19/2009  . WEIGHT LOSS 03/11/2007  . HYPOKALEMIA 05/19/2009  . Diarrhea 05/19/2009  . ARTHRITIS, HIP 05/19/2009  . ANXIETY DEPRESSION 05/19/2009  . COLITIS 05/22/2007  . UTI 03/11/2007  . Palpitations 10/24/2010  . Lymphadenopathy 11/01/2010  . Chest pain 10/24/2010  . Emphysema 11/01/2010  . History of chicken pox   . History of measles   . H. pylori infection 11/15/2010  . Shingles 11/15/2010  . Urticaria 11/15/2010  . Postherpetic neuralgia 11/23/2010  . Preoperative clearance 08/16/2011  . COPD (chronic obstructive pulmonary disease)   . Arthritis     right hip   . Femur fracture, right 05/19/2012    Past Surgical History  Procedure Date  . Abdominal hysterectomy     total  . Appendectomy   . Other surgical history     bilateral breat biopsy wtih- fibrocystic breast disease   . Total hip arthroplasty 10/01/2011    Procedure: TOTAL HIP ARTHROPLASTY;  Surgeon: Loanne Drilling, MD;  Location: [facility];  Service:  Orthopedics;  Laterality: Right;    Family History  Problem Relation Age of Onset  . Alzheimer's disease Mother   . Other Father     blood clot  . Pulmonary embolism Father   . Cancer Sister 32    breast  . Emphysema Sister   . Cancer Maternal Aunt     breast  . Heart attack Paternal Grandfather   . Cancer Sister     bladder ca  . Emphysema Sister   . Cancer Sister     breast  . Emphysema Sister   . GER disease Brother   . Arthritis Sister     History   Social History  . Marital Status: Married    Spouse Name: N/A    Number of Children: N/A  . Years of Education: N/A   Occupational History  . Not on file.   Social History Main Topics  . Smoking status: Never Smoker   . Smokeless tobacco: Never Used  . Alcohol Use: No  . Drug Use: No  . Sexually Active: No   Other Topics Concern  . Not on file   Social History Narrative  . No narrative on file    Current Outpatient Prescriptions on File Prior to Visit  Medication Sig Dispense Refill  . naproxen sodium (ANAPROX) 220 MG tablet Take 220 mg by mouth 2 (two) times daily with a meal.        Allergies  Allergen Reactions  . Tramadol Hives    Review of Systems Review of Systems  Constitutional: Negative for fever and malaise/fatigue.  HENT: Negative for congestion.   Eyes: Negative for discharge.  Respiratory: Negative for shortness of breath.   Cardiovascular: Negative for chest pain, palpitations and leg swelling.  Gastrointestinal: Negative for nausea, abdominal pain and diarrhea.  Genitourinary: Negative for dysuria.  Musculoskeletal: Positive for joint pain. Negative for falls.       Right hip pain  Skin: Negative for rash.  Neurological: Negative for loss of consciousness and headaches.  Endo/Heme/Allergies: Negative for polydipsia.  Psychiatric/Behavioral: Negative for depression and suicidal ideas. The patient is not nervous/anxious and does not have insomnia.     Objective  BP 126/69   Pulse 67  Temp 97.4 F (36.3 C) (Temporal)  Ht 5\' 1"  (1.549 m)  Wt 91 lb 12.8 oz (41.64 kg)  BMI 17.35 kg/m2  SpO2 99%  Physical Exam  Physical Exam  Constitutional: She is oriented to person, place, and time. She appears distressed.       Unable to sit comfortably  HENT:  Head: Normocephalic and atraumatic.  Eyes: Conjunctivae normal are normal.  Neck: Neck supple. No thyromegaly present.  Cardiovascular: Normal rate, regular rhythm and normal heart sounds.   No murmur heard. Pulmonary/Chest: Effort normal and breath sounds normal. She has no wheezes.  Abdominal: She exhibits no distension and no mass.  Musculoskeletal: She exhibits no edema.  Lymphadenopathy:    She has no cervical adenopathy.  Neurological: She is alert and oriented to person, place, and time.  Skin: Skin is warm and dry. No rash noted. She is not diaphoretic.  Psychiatric: Memory, affect and judgment normal.    Lab Results  Component Value Date   TSH 0.96 03/11/2012   Lab Results  Component Value Date   WBC 6.0 05/19/2012   HGB 13.9 05/19/2012   HCT 42.0 05/19/2012   MCV 100.0 05/19/2012   PLT 203.0 05/19/2012   Lab Results  Component Value Date   CREATININE 0.6 05/19/2012   BUN 13 05/19/2012   NA 137 05/19/2012   K 4.1 05/19/2012   CL 99 05/19/2012   CO2 29 05/19/2012   Lab Results  Component Value Date   ALT 20 03/11/2012   AST 26 03/11/2012   ALKPHOS 55 03/11/2012   BILITOT 0.7 03/11/2012   Lab Results  Component Value Date   CHOL 203* 08/16/2011   Lab Results  Component Value Date   HDL 67.10 08/16/2011   Lab Results  Component Value Date   LDLCALC  Value: 126        Total Cholesterol/HDL:CHD Risk Coronary Heart Disease Risk Table                     Men   Women  1/2 Average Risk   3.4   3.3  Average Risk       5.0   4.4  2 X Average Risk   9.6   7.1  3 X Average Risk  23.4   11.0        Use the calculated Patient Ratio above and the CHD Risk Table to determine the patient's CHD  Risk.        ATP III CLASSIFICATION (LDL):  <100     mg/dL   Optimal  161-096  mg/dL   Near or Above  Optimal  130-159  mg/dL   Borderline  454-098  mg/dL   High  >119     mg/dL   Very High* 1/47/8295   Lab Results  Component Value Date   TRIG 58.0 08/16/2011   Lab Results  Component Value Date   CHOLHDL 3 08/16/2011     Assessment & Plan  VITAMIN D DEFICIENCY Check Vitamin D level today and start Citracal bid  Femur fracture, right Patient now being seen by ortho recently and she was found to have a stress fracture. 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 She is encouraged to rest as much as possible and consider a cane for decreased stress on the leg. Is using Naproxen bid and may use Tylenol up to 3000 mg in 24 hours to se eif that helps  WEIGHT LOSS Has had some weight gain since the last visit.

## 2012-05-20 LAB — VITAMIN D 25 HYDROXY (VIT D DEFICIENCY, FRACTURES): Vit D, 25-Hydroxy: 22 ng/mL — ABNORMAL LOW (ref 30–89)

## 2012-05-20 MED ORDER — VITAMIN D (ERGOCALCIFEROL) 1.25 MG (50000 UNIT) PO CAPS: 50000.0000 [IU] | ORAL_CAPSULE | ORAL | Status: DC

## 2012-05-20 NOTE — Progress Notes (Signed)
Quick Note:  Patient Informed and voiced understanding.  RX sent ______ 

## 2012-05-20 NOTE — Addendum Note (Signed)
Addended by: Court Joy on: 05/20/2012 12:24 PM   Modules accepted: Orders

## 2012-06-30 ENCOUNTER — Ambulatory Visit: Payer: Medicare Other | Admitting: Family Medicine

## 2012-07-16 ENCOUNTER — Telehealth: Payer: Self-pay | Admitting: Family Medicine

## 2012-07-16 NOTE — Telephone Encounter (Signed)
Patient Information:  Caller Name: Merie  Phone: [phone]  Patient: [name], [name]  Gender: Female  DOB: 04-10-1941  Age: 71 Years  PCP: Danise [facility])  Office Follow Up:  Does the office need to follow up with this patient?: Yes  Instructions For The Office: Asking for medications, refuses appt.  States that she does not drive and her husband is in a rehab facility and she has no one to bring her to an appt.   Symptoms  Reason For Call & Symptoms: Has had a cold/cough since 12/8.  Sputum is yellow.  Reviewed Health History In EMR: Yes  Reviewed Medications In EMR: Yes  Reviewed Allergies In EMR: Yes  Reviewed Surgeries / Procedures: Yes  Date of Onset of Symptoms: 07/06/2012  Treatments Tried: Delsym Cough syrup  Treatments Tried Worked: No  Guideline(s) Used:  Cough  Disposition Per Guideline:   See Today in Office  Reason For Disposition Reached:   Known COPD or other severe lung disease (i.e., bronchiectasis, cystic fibrosis, lung surgery) and worsening symptoms (i.e., increased sputum purulence or amount, increased breathing difficulty)  Advice Given:  N/A  Patient Refused Recommendation:  Patient Requests Prescription  Has no transportation

## 2012-07-16 NOTE — Telephone Encounter (Signed)
Please advise if you need to see patient or if an RX can be done?

## 2012-10-07 ENCOUNTER — Other Ambulatory Visit: Payer: Self-pay | Admitting: Family Medicine

## 2013-02-13 ENCOUNTER — Telehealth: Payer: Self-pay

## 2013-02-13 MED ORDER — CITALOPRAM HYDROBROMIDE 20 MG PO TABS: 20.0000 mg | ORAL_TABLET | Freq: Every day | ORAL | Status: DC

## 2013-02-13 NOTE — Telephone Encounter (Signed)
Pt called and left a message stating that she would like to speak to MD personally.  Pt lives in [place]. Husband passed away 10 days after she moved. Pt is going to move back to [place] but probably not until august or September. Pt has to get a hip replacement and cataract surgery before moving.  Pt would like her Celexa refilled.  I sent in a 3 month supply but informed patient that she will need to make appt before any addt refills could be done. Pt voiced understanding

## 2013-06-04 ENCOUNTER — Other Ambulatory Visit: Payer: Self-pay

## 2015-01-24 ENCOUNTER — Other Ambulatory Visit: Payer: Self-pay

## 2015-05-31 ENCOUNTER — Telehealth: Payer: Self-pay | Admitting: *Deleted

## 2015-05-31 NOTE — Telephone Encounter (Signed)
Patient signed ROI [facility]. Forwarded to [place] to scan/email to medical records. JG//CMA

## 2015-09-13 ENCOUNTER — Encounter (HOSPITAL_COMMUNITY): Payer: Self-pay

## 2015-09-13 ENCOUNTER — Emergency Department (HOSPITAL_COMMUNITY)
Admission: EM | Admit: 2015-09-13 | Discharge: 2015-09-13 | Disposition: A | Discharge status: Home / Self Care | Payer: Medicare Other | Attending: Emergency Medicine | Admitting: Emergency Medicine

## 2015-09-13 DIAGNOSIS — J439 Emphysema, unspecified: Secondary | ICD-10-CM | POA: Diagnosis not present

## 2015-09-13 DIAGNOSIS — R110 Nausea: Secondary | ICD-10-CM | POA: Diagnosis not present

## 2015-09-13 DIAGNOSIS — R109 Unspecified abdominal pain: Secondary | ICD-10-CM | POA: Diagnosis not present

## 2015-09-13 LAB — CBC
HCT: 38.9 % (ref 36.0–46.0)
Hemoglobin: 13.2 g/dL (ref 12.0–15.0)
MCH: 33.4 pg (ref 26.0–34.0)
MCHC: 33.9 g/dL (ref 30.0–36.0)
MCV: 98.5 fL (ref 78.0–100.0)
Platelets: 230 10*3/uL (ref 150–400)
RBC: 3.95 MIL/uL (ref 3.87–5.11)
RDW: 12 % (ref 11.5–15.5)
WBC: 5.5 10*3/uL (ref 4.0–10.5)

## 2015-09-13 LAB — COMPREHENSIVE METABOLIC PANEL
ALT: 19 U/L (ref 14–54)
AST: 25 U/L (ref 15–41)
Albumin: 4.5 g/dL (ref 3.5–5.0)
Alkaline Phosphatase: 66 U/L (ref 38–126)
Anion gap: 8 (ref 5–15)
BUN: 12 mg/dL (ref 6–20)
CO2: 29 mmol/L (ref 22–32)
Calcium: 10 mg/dL (ref 8.9–10.3)
Chloride: 100 mmol/L — ABNORMAL LOW (ref 101–111)
Creatinine, Ser: 0.59 mg/dL (ref 0.44–1.00)
GFR calc Af Amer: >60 mL/min (ref >60)
GFR calc non Af Amer: >60 mL/min (ref >60)
Glucose, Bld: 107 mg/dL — ABNORMAL HIGH (ref 65–99)
Potassium: 3.9 mmol/L (ref 3.5–5.1)
Sodium: 137 mmol/L (ref 135–145)
Total Bilirubin: 0.8 mg/dL (ref 0.3–1.2)
Total Protein: 7 g/dL (ref 6.5–8.1)

## 2015-09-13 LAB — LIPASE, BLOOD: Lipase: 20 U/L (ref 11–51)

## 2015-09-13 NOTE — ED Notes (Signed)
This RN was notified that the Pt left.

## 2015-09-13 NOTE — ED Notes (Signed)
Pt c/o intermittent abdominal cramping and nausea starting this morning.  Denies pain.  Hx IBS w/ diarrhea.  Sts "some relief w/ heating pad."

## 2017-04-22 DIAGNOSIS — M25562 Pain in left knee: Secondary | ICD-10-CM | POA: Diagnosis not present

## 2017-06-03 DIAGNOSIS — M25562 Pain in left knee: Secondary | ICD-10-CM | POA: Diagnosis not present

## 2017-06-05 DIAGNOSIS — E782 Mixed hyperlipidemia: Secondary | ICD-10-CM | POA: Diagnosis not present

## 2017-06-05 DIAGNOSIS — Z23 Encounter for immunization: Secondary | ICD-10-CM | POA: Diagnosis not present

## 2017-06-05 DIAGNOSIS — Z0000 Encounter for general adult medical examination without abnormal findings: Secondary | ICD-10-CM | POA: Diagnosis not present

## 2017-06-14 DIAGNOSIS — M25562 Pain in left knee: Secondary | ICD-10-CM | POA: Diagnosis not present

## 2017-06-21 DIAGNOSIS — R197 Diarrhea, unspecified: Secondary | ICD-10-CM | POA: Diagnosis not present

## 2017-06-21 DIAGNOSIS — R509 Fever, unspecified: Secondary | ICD-10-CM | POA: Diagnosis not present

## 2017-06-21 DIAGNOSIS — J00 Acute nasopharyngitis [common cold]: Secondary | ICD-10-CM | POA: Diagnosis not present

## 2017-06-24 DIAGNOSIS — J209 Acute bronchitis, unspecified: Secondary | ICD-10-CM | POA: Diagnosis not present

## 2017-06-26 DIAGNOSIS — R05 Cough: Secondary | ICD-10-CM | POA: Diagnosis not present

## 2017-06-26 DIAGNOSIS — R509 Fever, unspecified: Secondary | ICD-10-CM | POA: Diagnosis not present

## 2017-06-26 DIAGNOSIS — R062 Wheezing: Secondary | ICD-10-CM | POA: Diagnosis not present

## 2017-06-26 DIAGNOSIS — R9389 Abnormal findings on diagnostic imaging of other specified body structures: Secondary | ICD-10-CM | POA: Diagnosis not present

## 2017-06-26 DIAGNOSIS — J209 Acute bronchitis, unspecified: Secondary | ICD-10-CM | POA: Diagnosis not present

## 2017-06-28 DIAGNOSIS — R030 Elevated blood-pressure reading, without diagnosis of hypertension: Secondary | ICD-10-CM | POA: Diagnosis not present

## 2017-06-28 DIAGNOSIS — E860 Dehydration: Secondary | ICD-10-CM | POA: Diagnosis not present

## 2017-06-28 DIAGNOSIS — R197 Diarrhea, unspecified: Secondary | ICD-10-CM | POA: Diagnosis not present

## 2017-06-28 DIAGNOSIS — J209 Acute bronchitis, unspecified: Secondary | ICD-10-CM | POA: Diagnosis not present

## 2017-07-02 DIAGNOSIS — R197 Diarrhea, unspecified: Secondary | ICD-10-CM | POA: Diagnosis not present

## 2017-07-04 ENCOUNTER — Emergency Department (HOSPITAL_COMMUNITY): Payer: PPO

## 2017-07-04 ENCOUNTER — Other Ambulatory Visit: Payer: Self-pay

## 2017-07-04 ENCOUNTER — Emergency Department (HOSPITAL_COMMUNITY)
Admission: EM | Admit: 2017-07-04 | Discharge: 2017-07-04 | Disposition: A | Discharge status: Home / Self Care | Payer: PPO | Attending: Emergency Medicine | Admitting: Emergency Medicine

## 2017-07-04 ENCOUNTER — Encounter (HOSPITAL_COMMUNITY): Payer: Self-pay

## 2017-07-04 DIAGNOSIS — E876 Hypokalemia: Secondary | ICD-10-CM | POA: Diagnosis not present

## 2017-07-04 DIAGNOSIS — R10817 Generalized abdominal tenderness: Secondary | ICD-10-CM | POA: Insufficient documentation

## 2017-07-04 DIAGNOSIS — R531 Weakness: Secondary | ICD-10-CM | POA: Diagnosis not present

## 2017-07-04 DIAGNOSIS — R197 Diarrhea, unspecified: Secondary | ICD-10-CM

## 2017-07-04 DIAGNOSIS — R000 Tachycardia, unspecified: Secondary | ICD-10-CM | POA: Diagnosis not present

## 2017-07-04 DIAGNOSIS — R109 Unspecified abdominal pain: Secondary | ICD-10-CM | POA: Diagnosis not present

## 2017-07-04 DIAGNOSIS — Z79899 Other long term (current) drug therapy: Secondary | ICD-10-CM | POA: Diagnosis not present

## 2017-07-04 DIAGNOSIS — R05 Cough: Secondary | ICD-10-CM | POA: Diagnosis not present

## 2017-07-04 DIAGNOSIS — E860 Dehydration: Secondary | ICD-10-CM

## 2017-07-04 DIAGNOSIS — Z96641 Presence of right artificial hip joint: Secondary | ICD-10-CM | POA: Insufficient documentation

## 2017-07-04 DIAGNOSIS — J449 Chronic obstructive pulmonary disease, unspecified: Secondary | ICD-10-CM | POA: Insufficient documentation

## 2017-07-04 DIAGNOSIS — A09 Infectious gastroenteritis and colitis, unspecified: Secondary | ICD-10-CM | POA: Diagnosis not present

## 2017-07-04 LAB — CBC WITH DIFFERENTIAL/PLATELET
Basophils Absolute: 0 10*3/uL (ref 0.0–0.1)
Basophils Relative: 0 %
Eosinophils Absolute: 0 10*3/uL (ref 0.0–0.7)
Eosinophils Relative: 0 %
HCT: 39.5 % (ref 36.0–46.0)
Hemoglobin: 13.6 g/dL (ref 12.0–15.0)
Lymphocytes Relative: 10 %
Lymphs Abs: 1.2 10*3/uL (ref 0.7–4.0)
MCH: 33.4 pg (ref 26.0–34.0)
MCHC: 34.4 g/dL (ref 30.0–36.0)
MCV: 97.1 fL (ref 78.0–100.0)
Monocytes Absolute: 0.7 10*3/uL (ref 0.1–1.0)
Monocytes Relative: 6 %
Neutro Abs: 10 10*3/uL — ABNORMAL HIGH (ref 1.7–7.7)
Neutrophils Relative %: 84 %
Platelets: 384 10*3/uL (ref 150–400)
RBC: 4.07 MIL/uL (ref 3.87–5.11)
RDW: 12.5 % (ref 11.5–15.5)
WBC: 11.9 10*3/uL — ABNORMAL HIGH (ref 4.0–10.5)

## 2017-07-04 LAB — MAGNESIUM: Magnesium: 2.3 mg/dL (ref 1.7–2.4)

## 2017-07-04 LAB — COMPREHENSIVE METABOLIC PANEL
ALT: 24 U/L (ref 14–54)
AST: 30 U/L (ref 15–41)
Albumin: 4.5 g/dL (ref 3.5–5.0)
Alkaline Phosphatase: 68 U/L (ref 38–126)
Anion gap: 12 (ref 5–15)
BUN: 8 mg/dL (ref 6–20)
CO2: 28 mmol/L (ref 22–32)
Calcium: 9.5 mg/dL (ref 8.9–10.3)
Chloride: 94 mmol/L — ABNORMAL LOW (ref 101–111)
Creatinine, Ser: 0.65 mg/dL (ref 0.44–1.00)
GFR calc Af Amer: >60 mL/min (ref >60)
GFR calc non Af Amer: >60 mL/min (ref >60)
Glucose, Bld: 80 mg/dL (ref 65–99)
Potassium: 3.3 mmol/L — ABNORMAL LOW (ref 3.5–5.1)
Sodium: 134 mmol/L — ABNORMAL LOW (ref 135–145)
Total Bilirubin: 1.4 mg/dL — ABNORMAL HIGH (ref 0.3–1.2)
Total Protein: 7.1 g/dL (ref 6.5–8.1)

## 2017-07-04 MED ORDER — IOPAMIDOL (ISOVUE-300) INJECTION 61%
INTRAVENOUS | Status: AC
  Administered 2017-07-04: 30 mL via ORAL
  Filled 2017-07-04: qty 30

## 2017-07-04 MED ORDER — IOPAMIDOL (ISOVUE-300) INJECTION 61%
INTRAVENOUS | Status: AC
  Administered 2017-07-04: 75 mL via INTRAVENOUS
  Filled 2017-07-04: qty 75

## 2017-07-04 MED ORDER — POTASSIUM CHLORIDE CRYS ER 20 MEQ PO TBCR
40.0000 meq | EXTENDED_RELEASE_TABLET | Freq: Once | ORAL | Status: AC
  Administered 2017-07-04: 40 meq via ORAL
  Filled 2017-07-04: qty 2

## 2017-07-04 NOTE — ED Triage Notes (Addendum)
Pt presents to ER stating that her PCP Dr. Cyndia BentBadger told her to come here to get fluids. She reports that they did a stool sample for C. Diff at his office and they told her that we should be able to give her the results from their system. A&Ox4. Ambulatory.

## 2017-07-04 NOTE — ED Provider Notes (Signed)
[facility]-EMERGENCY DEPT Provider Note   CSN: [id number] Arrival date & time: 07/04/17  1339     History   Chief Complaint Chief Complaint  Patient presents with  . Dehydration    sent by PCP    HPI Wendy HalimGlenda Hill is a 76 y.o. female.  The history is provided by the patient. No language interpreter was used.   [name] is a 76 y.o. female who presents to the Emergency Department complaining of diarrhea.  On Thanksgiving day she began sick with sore throat, cough, congestion shortly followed by diarrhea.  She was evaluated twice and was on 2 rounds of antibiotics for her symptoms and eventually stopped them about a week ago because she was not feeling any better.  She reports persistent diarrhea with up to 8 bowel movements per day, often triggered by oral intake.  She did have some abdominal cramping occasionally but that has now resolved.  She reports feeling dehydrated and weak.  She is drinking fluids.  No reports of fevers, chest pain, vomiting.  She saw her PCP a few days ago and dropped off a stool sample and just received the results that she was C. difficile negative.  Stools are described as slushy and a creamy in color.  Symptoms are moderate and constant. Past Medical History:  Diagnosis Date  . ANEMIA 05/19/2009  . ANXIETY DEPRESSION 05/19/2009  . Arthritis    right hip   . ARTHRITIS, HIP 05/19/2009  . Chest pain 10/24/2010  . COLITIS 05/22/2007  . COPD (chronic obstructive pulmonary disease) (HCC)   . Diarrhea 05/19/2009  . Emphysema 11/01/2010  . Femur fracture, right (HCC) 05/19/2012  . H. pylori infection 11/15/2010  . History of chicken pox   . History of measles   . HYPERLIPIDEMIA 03/11/2007  . HYPERTHYROIDISM 03/11/2007  . HYPOKALEMIA 05/19/2009  . Irritable bowel syndrome 05/19/2009  . Lymphadenopathy 11/01/2010  . Palpitations 10/24/2010  . Postherpetic neuralgia 11/23/2010  . Preoperative clearance 08/16/2011  . Shingles 11/15/2010  .  Urticaria 11/15/2010  . UTI 03/11/2007  . VITAMIN D DEFICIENCY 05/19/2009  . WEIGHT LOSS 03/11/2007    Patient Active Problem List   Diagnosis Date Noted  . Femur fracture, right (HCC) 05/19/2012  . Postop Hypokalemia 10/04/2011  . Postop Acute blood loss anemia 10/02/2011  . Postop Hyponatremia 10/02/2011  . OA (osteoarthritis) of knee 10/01/2011  . 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 Dyspnea on exertion 08/22/2011  . Heart palpitations 08/22/2011  . Preoperative clearance 08/16/2011  . Postherpetic neuralgia 11/23/2010  . H. pylori infection 11/15/2010  . Lymphadenopathy 11/01/2010  . Emphysema 11/01/2010  . History of chicken pox   . History of measles   . Chest pain 10/24/2010  . Palpitations 10/24/2010  . VITAMIN D DEFICIENCY 05/19/2009  . HYPOKALEMIA 05/19/2009  . ANEMIA 05/19/2009  . ANXIETY DEPRESSION 05/19/2009  . Irritable bowel syndrome 05/19/2009  . ARTHRITIS, HIP 05/19/2009  . COLITIS 05/22/2007  . HYPERTHYROIDISM 03/11/2007  . HYPERLIPIDEMIA 03/11/2007  . WEIGHT LOSS 03/11/2007    Past Surgical History:  Procedure Laterality Date  . ABDOMINAL HYSTERECTOMY     total  . APPENDECTOMY    . OTHER SURGICAL HISTORY     bilateral breat biopsy wtih- fibrocystic breast disease   . TOTAL HIP ARTHROPLASTY  10/01/2011   Procedure: TOTAL HIP ARTHROPLASTY;  Surgeon: Loanne DrillingFrank V Aluisio, MD;  Location: [facility];  Service: Orthopedics;  Laterality: Right;    OB History    No data available  Home Medications    Prior to Admission medications   Medication Sig Start Date End Date Taking? Authorizing Provider  Ascorbic Acid (VITAMIN C PO) Take 1 tablet by mouth daily.   Yes [provider]  Calcium Citrate (CALCITRATE PO) Take 1 tablet by mouth daily.   Yes [provider]  Cholecalciferol (VITAMIN D PO) Take 1 tablet by mouth daily.   Yes [provider]  Cyanocobalamin (VITAMIN B 12 PO) Take 1 tablet by mouth daily.   Yes [provider]  MAGNESIUM PO Take 500 mg by mouth daily.   Yes [provider]  Melatonin 5 MG TABS Take 1 tablet by mouth daily.   Yes [provider]  Multiple Vitamins-Minerals (CENTRUM SILVER PO) Take 1 tablet by mouth daily.   Yes [provider]  Probiotic Product (PROBIOTIC PO) Take 1 tablet by mouth daily.   Yes [provider]  ALPRAZolam Prudy Feeler) 1 MG tablet Take 1 tablet (1 mg total) by mouth 2 (two) times daily as needed for sleep or anxiety. 1/2 tab po daily prn anxiety, palpitations, 1/2 to 1 tab po qhs prn insomnia, anxiety Patient not taking: Reported on 07/04/2017 05/19/12   Bradd Canary, MD  citalopram (CELEXA) 20 MG tablet Take 1 tablet (20 mg total) by mouth daily. Patient not taking: Reported on 07/04/2017 02/13/13   Bradd Canary, MD  Vitamin D, Ergocalciferol, (DRISDOL) 50000 UNITS CAPS Take 1 capsule (50,000 Units total) by mouth every 7 (seven) days. X 12 weeks Patient not taking: Reported on 07/04/2017 05/20/12   Bradd Canary, MD    Family History Family History  Problem Relation Age of Onset  . Alzheimer's disease Mother   . Other Father        blood clot  . Pulmonary embolism Father   . Cancer Sister 6       breast  . Emphysema Sister   . Cancer Maternal Aunt        breast  . Heart attack Paternal Grandfather   . Cancer Sister        bladder ca  . Emphysema Sister   . Cancer Sister        breast  . Emphysema Sister   . GER disease Brother   . Arthritis Sister     Social History Social History   Tobacco Use  . Smoking status: Never Smoker  . Smokeless tobacco: Never Used  Substance Use Topics  . Alcohol use: No  . Drug use: No     Allergies   Fluoxetine; Sulfamethoxazole; and Tramadol   Review of Systems Review of Systems  All other systems reviewed and are negative.    Physical Exam Updated Vital Signs BP (!) 122/59 (BP Location: Right Arm)   Pulse 83   Temp 98.1 F (36.7 C) (Oral)    Resp 16   SpO2 98%   Physical Exam  Constitutional: She is oriented to person, place, and time. She appears well-developed and well-nourished.  HENT:  Head: Normocephalic and atraumatic.  Cardiovascular: Regular rhythm.  No murmur heard. Tachycardic  Pulmonary/Chest: Effort normal and breath sounds normal. No respiratory distress.  Abdominal: Soft. There is no rebound and no guarding.  Mild diffuse abdominal tenderness  Musculoskeletal: She exhibits no edema or tenderness.  Neurological: She is alert and oriented to person, place, and time.  Skin: Skin is warm and dry.  Psychiatric: She has a normal mood and affect. Her behavior is normal.  Nursing note  and vitals reviewed.    ED Treatments / Results  Labs (all labs ordered are listed, but only abnormal results are displayed) Labs Reviewed  COMPREHENSIVE METABOLIC PANEL - Abnormal; Notable for the following components:      Result Value   Sodium 134 (*)    Potassium 3.3 (*)    Chloride 94 (*)    Total Bilirubin 1.4 (*)    All other components within normal limits  CBC WITH DIFFERENTIAL/PLATELET - Abnormal; Notable for the following components:   WBC 11.9 (*)    Neutro Abs 10.0 (*)    All other components within normal limits  GASTROINTESTINAL PANEL BY PCR, STOOL (REPLACES STOOL CULTURE)  MAGNESIUM    EKG  EKG Interpretation  Date/Time:  Thursday July 04 2017 16:01:54 EST Ventricular Rate:  92 PR Interval:    QRS Duration: 82 QT Interval:  351 QTC Calculation: 435 R Axis:   87 Text Interpretation:  Sinus rhythm Borderline right axis deviation Baseline wander in lead(s) I Confirmed by Tilden Fossaees, Chez Bulnes [phone]([id number]) on 07/04/2017 4:20:09 PM       Radiology Dg Chest 2 View  Result Date: 07/04/2017 CLINICAL DATA:  Cough for several weeks EXAM: CHEST  2 VIEW COMPARISON:  Chest radiograph October 24, 2010 and chest CT November 09, 2010 FINDINGS: There is no edema or consolidation. The heart size and pulmonary vascularity are  normal. No adenopathy. There is aortic atherosclerosis. No evident bone lesions. IMPRESSION: Aortic atherosclerosis.  No edema or consolidation. Aortic Atherosclerosis (ICD10-I70.0). Electronically Signed   By: Bretta BangWilliam  Woodruff III M.D.   On: 07/04/2017 16:26   Ct Abdomen Pelvis W Contrast  Result Date: 07/04/2017 CLINICAL DATA:  Abdominal pain.  Gastritis or colitis suspected. EXAM: CT ABDOMEN AND PELVIS WITH CONTRAST TECHNIQUE: Multidetector CT imaging of the abdomen and pelvis was performed using the standard protocol following bolus administration of intravenous contrast. CONTRAST:  <See Chart> ISOVUE-300 IOPAMIDOL (ISOVUE-300) INJECTION 61%, <See Chart> ISOVUE-300 IOPAMIDOL (ISOVUE-300) INJECTION 61% COMPARISON:  11/09/2010 FINDINGS: Lower chest:  Unremarkable. Hepatobiliary: No focal abnormality within the liver parenchyma. There is no evidence for gallstones, gallbladder wall thickening, or pericholecystic fluid. No intrahepatic or extrahepatic biliary dilation. Pancreas: No focal mass lesion. No dilatation of the main duct. No intraparenchymal cyst. No peripancreatic edema. Spleen: No splenomegaly. No focal mass lesion. Adrenals/Urinary Tract: No adrenal nodule or mass. Kidneys unremarkable. No evidence for hydroureter. Bladder is distended and partially obscured by streak artifact from right hip replacement. Stomach/Bowel: Stomach is nondistended. No gastric wall thickening. No evidence of outlet obstruction. Duodenum is normally positioned as is the ligament of Treitz. No small bowel wall thickening. No small bowel dilatation. The terminal ileum is normal. The appendix is not visualized, but there is no edema or inflammation in the region of the cecum. Colon appears diffusely fluid-filled but there is not substantial colonic wall thickening. Vascular/Lymphatic: There is abdominal aortic atherosclerosis without aneurysm. There is no gastrohepatic or hepatoduodenal ligament lymphadenopathy. No  intraperitoneal or retroperitoneal lymphadenopathy. No pelvic sidewall lymphadenopathy. Reproductive: Uterus surgically absent.  There is no adnexal mass. Other: No evidence for intraperitoneal free fluid. Musculoskeletal: Status post right total hip replacement. Bones are demineralized. Bone windows reveal no worrisome lytic or sclerotic osseous lesions. IMPRESSION: 1. Colon is diffusely fluid-filled compatible with the reported clinical history of diarrhea. No appreciable colonic wall thickening. Small bowel is normal by CT imaging. 2. Otherwise no acute findings in the abdomen. 3.  Aortic Atherosclerois (ICD10-170.0) Electronically Signed   By: Kennith CenterEric  Mansell  M.D.   On: 07/04/2017 19:42    Procedures Procedures (including critical care time)  Medications Ordered in ED Medications  potassium chloride SA (K-DUR,KLOR-CON) CR tablet 40 mEq (40 mEq Oral Given 07/04/17 1931)  iopamidol (ISOVUE-300) 61 % injection (30 mLs Oral Contrast Given 07/04/17 1911)  iopamidol (ISOVUE-300) 61 % injection (75 mLs Intravenous Contrast Given 07/04/17 1908)     Initial Impression / Assessment and Plan / ED Course  I have reviewed the triage vital signs and the nursing notes.  Pertinent labs & imaging results that were available during my care of the patient were reviewed by me and considered in my medical decision making (see chart for details).     Patient here for evaluation of multiple weeks of diarrhea.  She does have mild hypokalemia and mild abdominal tenderness with a mild leukocytosis.  CT abdomen obtained to evaluate for evidence of diverticulitis.  CT abdomen with no evidence of diverticulitis.  She is feeling partially improved after IV fluid hydration.  Counseled patient on home care for diarrhea and dehydration.  Her PCP called in a prescription for Lomotil.  Discussed outpatient follow-up as well as return precautions.  Presentation is not consistent with serious bacterial infection, mesenteric  ischemia.  Final Clinical Impressions(s) / ED Diagnoses   Final diagnoses:  Dehydration  Diarrhea of presumed infectious origin    ED Discharge Orders    None       Tilden Fossa, MD 07/05/17 [phone]

## 2017-07-05 LAB — GASTROINTESTINAL PANEL BY PCR, STOOL (REPLACES STOOL CULTURE)

## 2017-07-15 DIAGNOSIS — R197 Diarrhea, unspecified: Secondary | ICD-10-CM | POA: Diagnosis not present

## 2017-07-15 DIAGNOSIS — K580 Irritable bowel syndrome with diarrhea: Secondary | ICD-10-CM | POA: Diagnosis not present

## 2017-07-15 DIAGNOSIS — R634 Abnormal weight loss: Secondary | ICD-10-CM | POA: Diagnosis not present

## 2017-08-12 DIAGNOSIS — R634 Abnormal weight loss: Secondary | ICD-10-CM | POA: Diagnosis not present

## 2017-08-12 DIAGNOSIS — R197 Diarrhea, unspecified: Secondary | ICD-10-CM | POA: Diagnosis not present

## 2017-09-30 ENCOUNTER — Encounter: Payer: Self-pay | Admitting: Family Medicine

## 2017-09-30 ENCOUNTER — Ambulatory Visit (INDEPENDENT_AMBULATORY_CARE_PROVIDER_SITE_OTHER): Payer: PPO | Admitting: Family Medicine

## 2017-09-30 VITALS — BP 120/70 | HR 76 | Temp 99.0°F | Resp 12 | Ht 61.0 in | Wt 100.0 lb

## 2017-09-30 DIAGNOSIS — E559 Vitamin D deficiency, unspecified: Secondary | ICD-10-CM | POA: Diagnosis not present

## 2017-09-30 DIAGNOSIS — R197 Diarrhea, unspecified: Secondary | ICD-10-CM | POA: Diagnosis not present

## 2017-09-30 NOTE — Assessment & Plan Note (Signed)
No changes in current management. We will plan on checking vit D in 05/2018.

## 2017-09-30 NOTE — Patient Instructions (Addendum)
A few things to remember from today's visit:   Diarrhea, unspecified type  Vitamin D deficiency  Continue working on weaning off Lomotil.   Please be sure medication list is accurate. If a new problem present, please set up appointment sooner than planned today.

## 2017-09-30 NOTE — Progress Notes (Signed)
HPI:   Ms.Chaniah P Dai is a 77 y.o. female, who is here today to establish care with me.  Former PCP: Dr Cyndia Bent Last preventive routine visit: 05/2017.  Chronic medical problems: Depression (past Hx),vit D deficiency, insomnia (on Melatonin), OA, and diarrhea among some.  She lives alone. Her son lives close by and her daughter lives in [place]. She follows a healthy diet and exercises regularly.    She has had intermittent diarrhea for years but worse in 05/2017 after she was started on abx to treat URI. She was having 7-10 loose stools daily. She is on Lomotil 2.5 mg tid,just decreased dose from qid. She is also on Cholestyramine bid.   Diarrhea has resolved. She has one formed stool daily in the morning. Mild abdominal discomfort right before defecation and resolved after.  She is following with GI and has an appt in 01/2018.  Vit D deficiency ,currently she is on Vit D3 5000 U daily.    Review of Systems  Constitutional: Negative for activity change, appetite change, fatigue and fever.  HENT: Negative for mouth sores, nosebleeds and trouble swallowing.   Eyes: Negative for redness and visual disturbance.  Respiratory: Negative for cough, shortness of breath and wheezing.   Cardiovascular: Negative for chest pain, palpitations and leg swelling.  Gastrointestinal: Negative for abdominal pain, blood in stool, nausea and vomiting.  Endocrine: Negative for cold intolerance and heat intolerance.  Genitourinary: Negative for decreased urine volume and hematuria.  Musculoskeletal: Negative for gait problem and myalgias.  Neurological: Negative for syncope, weakness and headaches.  Psychiatric/Behavioral: Negative for confusion. The patient is not nervous/anxious.       Current Outpatient Medications on File Prior to Visit  Medication Sig Dispense Refill  . Ascorbic Acid (VITAMIN C PO) Take 1 tablet by mouth daily.    . Calcium Citrate (CALCITRATE PO) Take 1  tablet by mouth daily.    . Cholecalciferol (VITAMIN D PO) Take 1 tablet by mouth daily.    . cholestyramine light (PREVALITE) 4 GM/DOSE powder     . Cyanocobalamin (VITAMIN B 12 PO) Take 1 tablet by mouth daily.    . diphenoxylate-atropine (LOMOTIL) 2.5-0.025 MG tablet Take by mouth.    Marland Kitchen MAGNESIUM PO Take 500 mg by mouth daily.    . Melatonin 5 MG TABS Take 1 tablet by mouth daily.    . Multiple Vitamins-Minerals (CENTRUM SILVER PO) Take 1 tablet by mouth daily.    . Probiotic Product (PROBIOTIC PO) Take 1 tablet by mouth daily.     No current facility-administered medications on file prior to visit.      Past Medical History:  Diagnosis Date  . ANEMIA 05/19/2009  . ANXIETY DEPRESSION 05/19/2009  . Arthritis    right hip   . ARTHRITIS, HIP 05/19/2009  . Chest pain 10/24/2010  . COLITIS 05/22/2007  . COPD (chronic obstructive pulmonary disease) (HCC)   . Diarrhea 05/19/2009  . Emphysema 11/01/2010  . Femur fracture, right (HCC) 05/19/2012  . H. pylori infection 11/15/2010  . History of chicken pox   . History of measles   . HYPERLIPIDEMIA 03/11/2007  . HYPERTHYROIDISM 03/11/2007  . HYPOKALEMIA 05/19/2009  . Irritable bowel syndrome 05/19/2009  . Lymphadenopathy 11/01/2010  . Palpitations 10/24/2010  . Postherpetic neuralgia 11/23/2010  . Preoperative clearance 08/16/2011  . Shingles 11/15/2010  . Urticaria 11/15/2010  . UTI 03/11/2007  . VITAMIN D DEFICIENCY 05/19/2009  . WEIGHT LOSS 03/11/2007   Allergies  Allergen Reactions  .  Fluoxetine Shortness Of Breath    After 2 weeks of use After 2 weeks of use   . Sulfamethoxazole Hives and Other (See Comments)    Unknown reaction Unknown reaction   . Tramadol Hives    Family History  Problem Relation Age of Onset  . Alzheimer's disease Mother   . Arthritis Mother   . Other Father        blood clot  . Pulmonary embolism Father   . Arthritis Father   . Cancer Sister [id number]       breast  . Emphysema Sister   . Cancer Maternal  Aunt        breast  . Heart attack Paternal Grandfather   . Cancer Sister        bladder ca  . Emphysema Sister   . Cancer Sister        breast  . Emphysema Sister   . GER disease Brother   . Arthritis Sister     Social History   Socioeconomic History  . Marital status: Widowed    Spouse name: None  . Number of children: 2  . Years of education: None  . Highest education level: None  Social Needs  . Financial resource strain: None  . Food insecurity - worry: None  . Food insecurity - inability: None  . Transportation needs - medical: None  . Transportation needs - non-medical: None  Occupational History  . None  Tobacco Use  . Smoking status: Never Smoker  . Smokeless tobacco: Never Used  Substance and Sexual Activity  . Alcohol use: No  . Drug use: No  . Sexual activity: No  Other Topics Concern  . None  Social History Narrative  . None    Vitals:   09/30/17 1000  BP: 120/70  Pulse: 76  Resp: 12  Temp: 99 F (37.2 C)  SpO2: 97%    Body mass index is 18.89 kg/m.   Physical Exam  Nursing note and vitals reviewed. Constitutional: She is oriented to person, place, and time. She appears well-developed and well-nourished. No distress.  HENT:  Head: Normocephalic and atraumatic.  Mouth/Throat: Oropharynx is clear and moist and mucous membranes are normal.  Eyes: Conjunctivae are normal. Pupils are equal, round, and reactive to light.  Neck: No tracheal deviation present. No thyroid mass and no thyromegaly present.  Cardiovascular: Normal rate and regular rhythm.  No murmur heard. Pulses:      Dorsalis pedis pulses are 2+ on the right side, and 2+ on the left side.  Respiratory: Effort normal and breath sounds normal. No respiratory distress.  GI: Soft. She exhibits no mass. There is no hepatomegaly. There is no tenderness.  Musculoskeletal: She exhibits no edema.  Lymphadenopathy:    She has no cervical adenopathy.  Neurological: She is alert and  oriented to person, place, and time. She has normal strength. Gait normal.  Skin: Skin is warm. No erythema.  Psychiatric: She has a normal mood and affect.  Well groomed, good eye contact.     ASSESSMENT AND PLAN:   Ms. Rivka BarbaraGlenda was seen today for establish care.  Diagnoses and all orders for this visit:   Vitamin D deficiency No changes in current management. We will plan on checking vit D in 05/2018.   Diarrhea Improved. She will continua weaning off Lomotil as tolerated. No changes in Cholestyramine. No changes in current management. She has an appt in 01/2018 with Dr Elnoria HowardHung.       Kathie RhodesBetty  G. [place], MD  [facility]. [place] office.

## 2017-09-30 NOTE — Assessment & Plan Note (Addendum)
Improved. She will continue weaning off Lomotil as tolerated. No changes in Cholestyramine. No changes in current management. She has an appt in 01/2018 with Dr Elnoria HowardHung.

## 2017-11-19 ENCOUNTER — Telehealth: Payer: Self-pay | Admitting: Family Medicine

## 2017-11-19 NOTE — Telephone Encounter (Signed)
Copied from CRM [phone]. Topic: Quick Communication - See Telephone Encounter >> Nov 19, 2017 10:32 AM Lorrine KinMcGee, Harun Brumley B, NT wrote: CRM for notification. See Telephone encounter for: 11/19/17. Patient calling and states she would like to transfer care from Dr Betty [place]Jordan to Dr Kriste BasqueHannah Kim. Please advise. CB#: [phone]

## 2017-11-19 NOTE — Telephone Encounter (Signed)
I left a voice message for pt to return call, Dr. Selena BattenKim is not accepting new patients, will need to request a provider that is accepting/would be [place] or Nature conservation officerafziger.

## 2017-12-11 DIAGNOSIS — Z1231 Encounter for screening mammogram for malignant neoplasm of breast: Secondary | ICD-10-CM | POA: Diagnosis not present

## 2017-12-11 DIAGNOSIS — M8589 Other specified disorders of bone density and structure, multiple sites: Secondary | ICD-10-CM | POA: Diagnosis not present

## 2017-12-11 DIAGNOSIS — M810 Age-related osteoporosis without current pathological fracture: Secondary | ICD-10-CM | POA: Diagnosis not present

## 2017-12-11 LAB — HM DEXA SCAN

## 2017-12-12 LAB — HM MAMMOGRAPHY

## 2017-12-13 ENCOUNTER — Encounter: Payer: Self-pay | Admitting: Family Medicine

## 2018-01-16 DIAGNOSIS — H353130 Nonexudative age-related macular degeneration, bilateral, stage unspecified: Secondary | ICD-10-CM | POA: Diagnosis not present

## 2018-01-16 DIAGNOSIS — H16223 Keratoconjunctivitis sicca, not specified as Sjogren's, bilateral: Secondary | ICD-10-CM | POA: Diagnosis not present

## 2018-02-26 DIAGNOSIS — R197 Diarrhea, unspecified: Secondary | ICD-10-CM | POA: Diagnosis not present

## 2018-02-26 DIAGNOSIS — R634 Abnormal weight loss: Secondary | ICD-10-CM | POA: Diagnosis not present

## 2018-03-11 ENCOUNTER — Encounter: Payer: Self-pay | Admitting: Family Medicine

## 2018-03-11 DIAGNOSIS — M810 Age-related osteoporosis without current pathological fracture: Secondary | ICD-10-CM | POA: Insufficient documentation

## 2018-06-06 ENCOUNTER — Encounter: Payer: PPO | Admitting: Family Medicine

## 2018-06-17 DIAGNOSIS — M810 Age-related osteoporosis without current pathological fracture: Secondary | ICD-10-CM | POA: Diagnosis not present

## 2018-06-17 DIAGNOSIS — Z136 Encounter for screening for cardiovascular disorders: Secondary | ICD-10-CM | POA: Diagnosis not present

## 2018-06-17 DIAGNOSIS — R252 Cramp and spasm: Secondary | ICD-10-CM | POA: Diagnosis not present

## 2018-06-17 DIAGNOSIS — Q8789 Other specified congenital malformation syndromes, not elsewhere classified: Secondary | ICD-10-CM | POA: Diagnosis not present

## 2018-06-17 DIAGNOSIS — K589 Irritable bowel syndrome without diarrhea: Secondary | ICD-10-CM | POA: Diagnosis not present

## 2018-06-17 DIAGNOSIS — Z0000 Encounter for general adult medical examination without abnormal findings: Secondary | ICD-10-CM | POA: Diagnosis not present

## 2018-06-17 DIAGNOSIS — Z79899 Other long term (current) drug therapy: Secondary | ICD-10-CM | POA: Diagnosis not present

## 2018-06-30 DIAGNOSIS — J988 Other specified respiratory disorders: Secondary | ICD-10-CM | POA: Diagnosis not present

## 2018-06-30 DIAGNOSIS — R05 Cough: Secondary | ICD-10-CM | POA: Diagnosis not present

## 2018-07-11 ENCOUNTER — Ambulatory Visit
Admission: RE | Admit: 2018-07-11 | Discharge: 2018-07-11 | Disposition: A | Discharge status: Home / Self Care | Payer: PPO | Source: Ambulatory Visit | Attending: Family Medicine | Admitting: Family Medicine

## 2018-07-11 ENCOUNTER — Other Ambulatory Visit: Payer: Self-pay | Admitting: Family Medicine

## 2018-07-11 DIAGNOSIS — R062 Wheezing: Secondary | ICD-10-CM | POA: Diagnosis not present

## 2018-07-11 DIAGNOSIS — R0602 Shortness of breath: Secondary | ICD-10-CM

## 2018-07-11 DIAGNOSIS — R05 Cough: Secondary | ICD-10-CM | POA: Diagnosis not present

## 2018-07-11 DIAGNOSIS — J449 Chronic obstructive pulmonary disease, unspecified: Secondary | ICD-10-CM | POA: Diagnosis not present

## 2018-07-28 DIAGNOSIS — G933 Postviral fatigue syndrome: Secondary | ICD-10-CM | POA: Diagnosis not present

## 2018-07-28 DIAGNOSIS — Z79899 Other long term (current) drug therapy: Secondary | ICD-10-CM | POA: Diagnosis not present

## 2019-01-08 DIAGNOSIS — Z1231 Encounter for screening mammogram for malignant neoplasm of breast: Secondary | ICD-10-CM | POA: Diagnosis not present

## 2019-01-08 IMAGING — CT CT ABD-PELV W/ CM
3 of 5 series · 16 of 46 positions shown, 18 images · IV contrast (ISOVUE)
Comparison: 11/09/2010

CLINICAL DATA: Abdominal pain.  Gastritis or colitis suspected.

EXAM:
CT ABDOMEN AND PELVIS WITH CONTRAST
TECHNIQUE: Multidetector CT imaging of the abdomen and pelvis was performed
using the standard protocol following bolus administration of
intravenous contrast.
CONTRAST:  <See Chart> 8HPY9N-822 IOPAMIDOL (8HPY9N-822) INJECTION
61%, <See Chart> 8HPY9N-822 IOPAMIDOL (8HPY9N-822) INJECTION 61%

[Series 2: axial st · axial · 0.68mm/px · z∈[+676,+1026]mm · 11 of 84 slices shown, 13 images]
[im 7/84  soft-tissue]
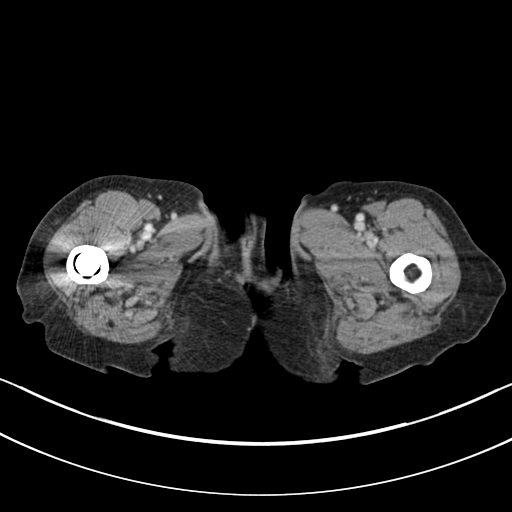
[im 7/84  bone]
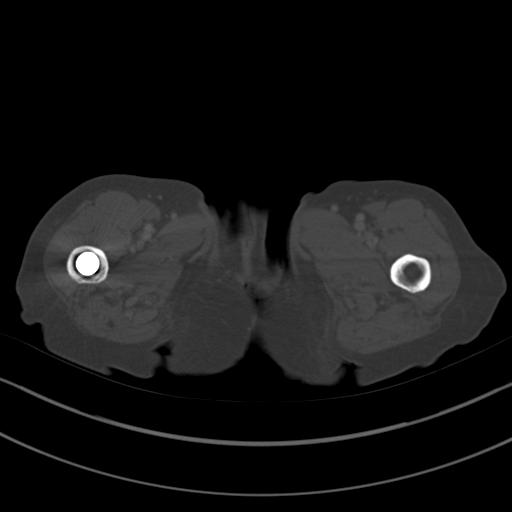
[im 14/84  soft-tissue]
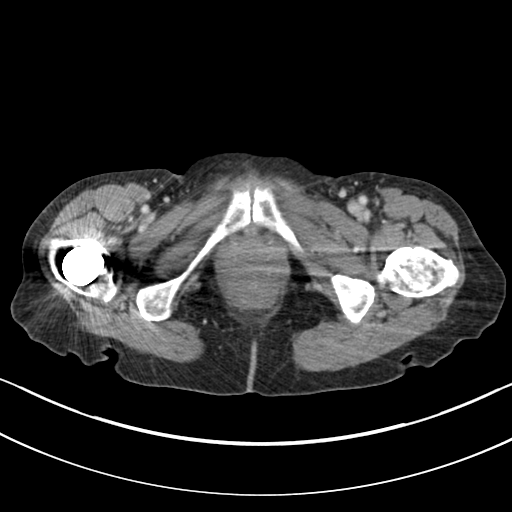
[im 21/84  soft-tissue]
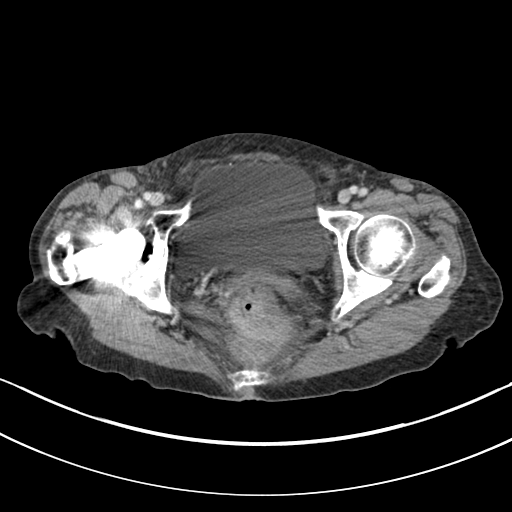
[im 28/84  soft-tissue]
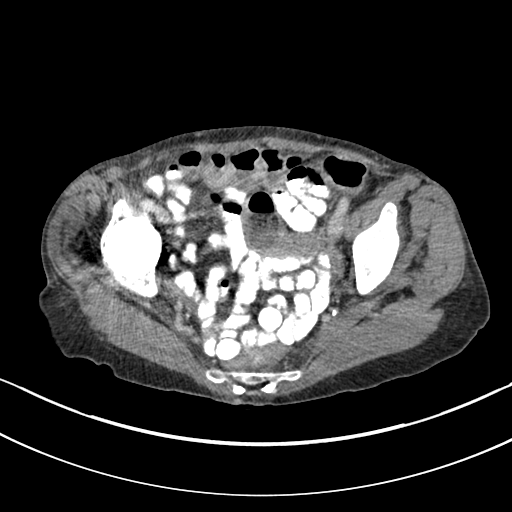
[im 35/84  soft-tissue]
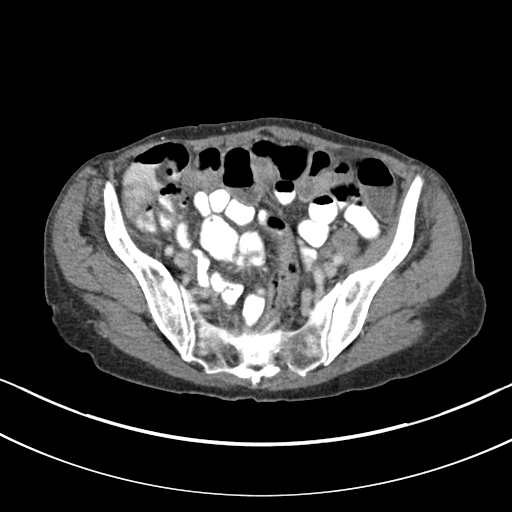
[im 42/84  soft-tissue]
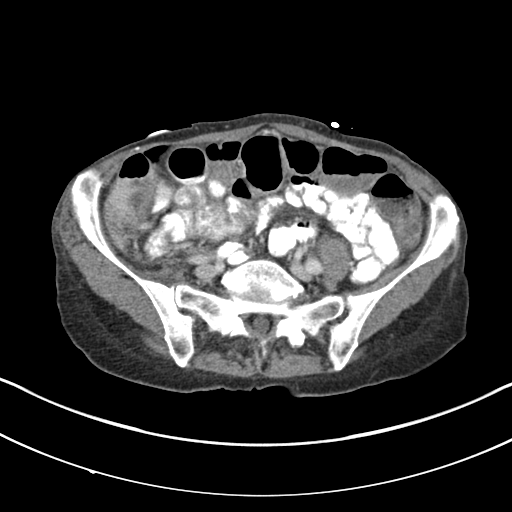
[im 49/84  soft-tissue]
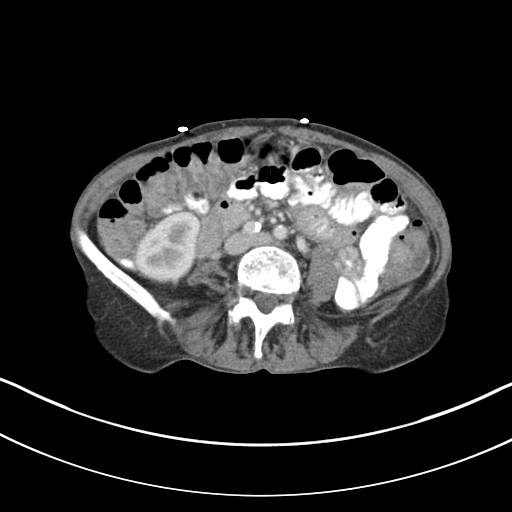
[im 56/84  soft-tissue]
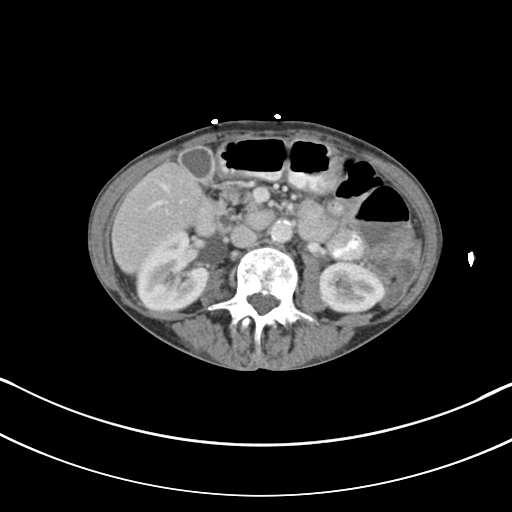
[im 63/84  soft-tissue]
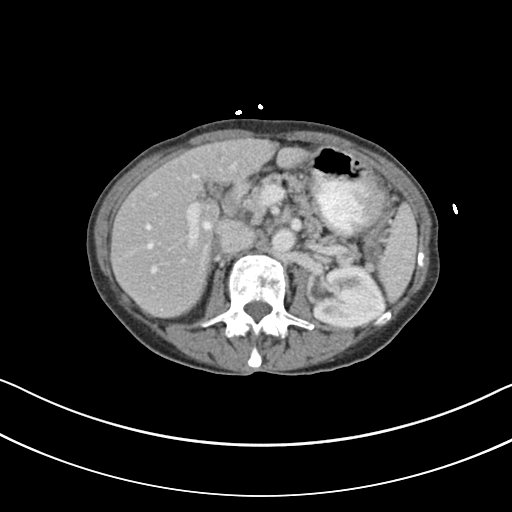
[im 63/84  bone]
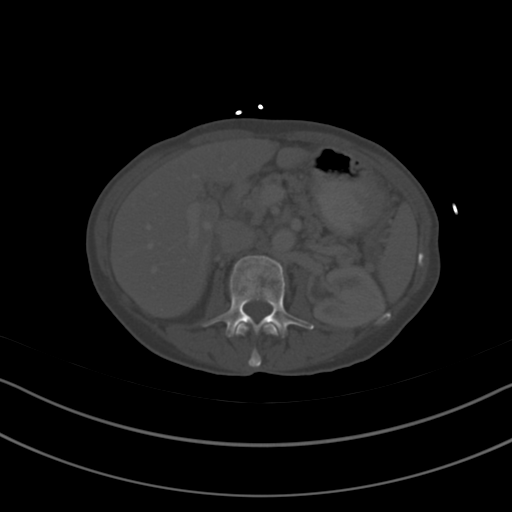
[im 70/84  soft-tissue]
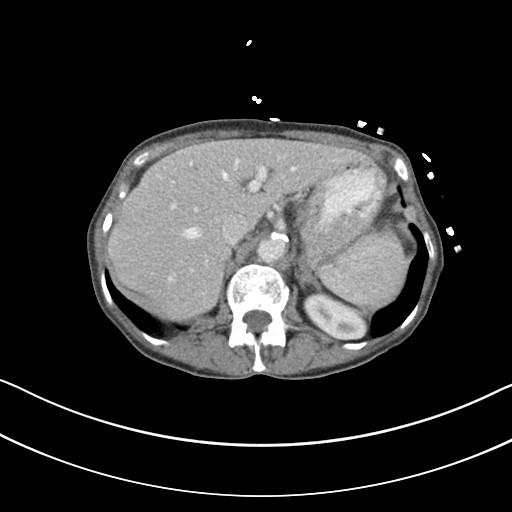
[im 77/84  soft-tissue]
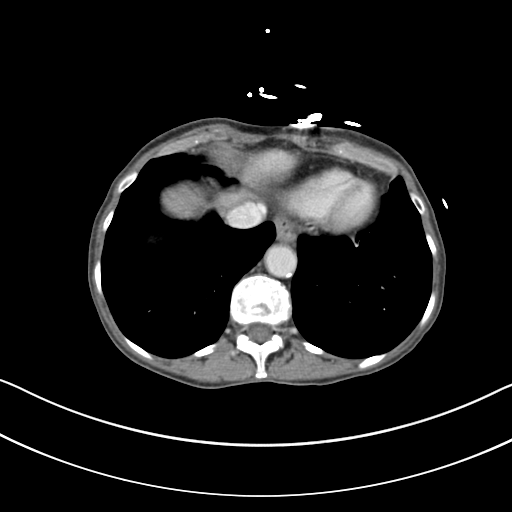

[Series 5: coronal st · coronal · 0.75mm/px · 3 of 100 slices shown]
[im 34/100  soft-tissue]
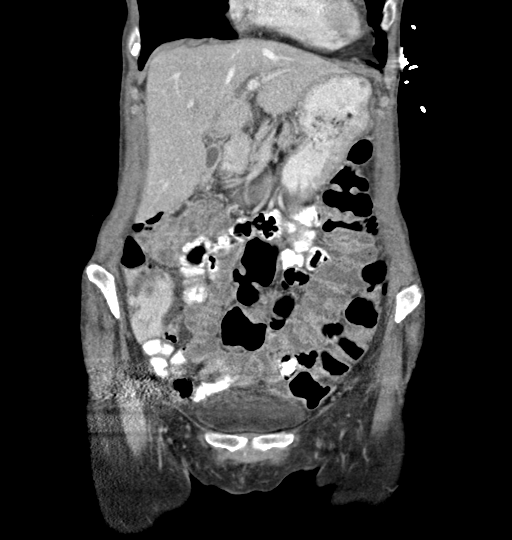
[im 45/100  soft-tissue]
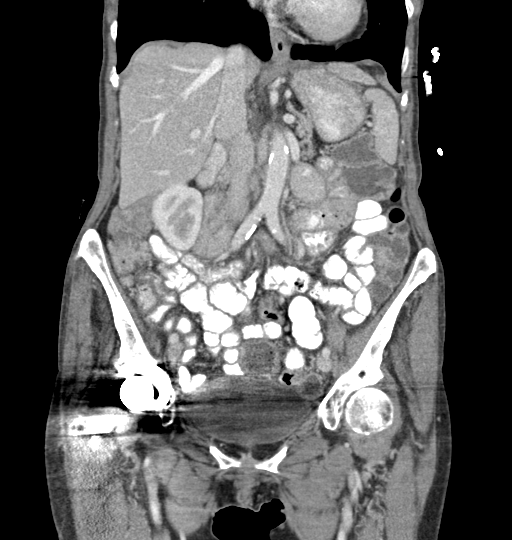
[im 56/100  soft-tissue]
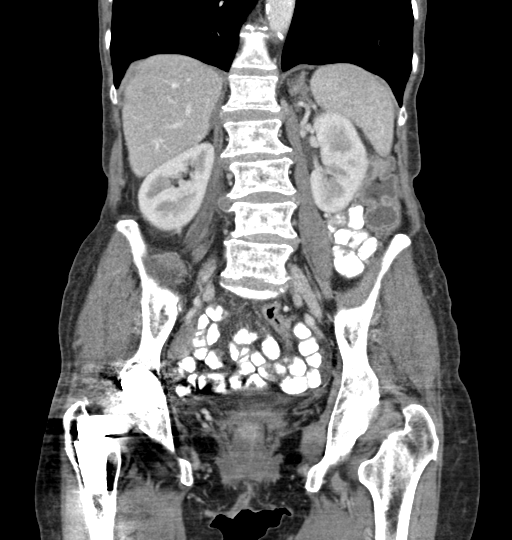

[Series 7: lung bases · axial · 0.68mm/px · z∈[+885,+913]mm · 2 of 95 slices shown]
[im 7/95  bone]
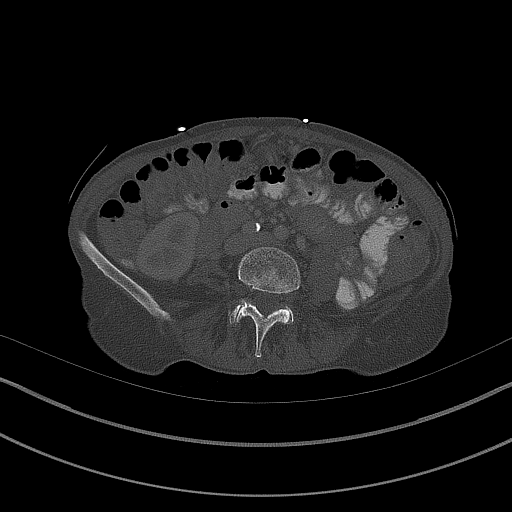
[im 21/95  bone]
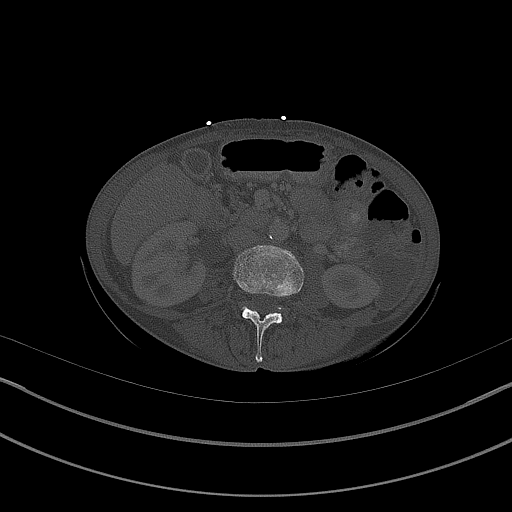

[16 of 46 positions shown; findings below may reference images not displayed]

FINDINGS: Lower chest:  Unremarkable.

Hepatobiliary: No focal abnormality within the liver parenchyma.
There is no evidence for gallstones, gallbladder wall thickening, or
pericholecystic fluid. No intrahepatic or extrahepatic biliary
dilation.

Pancreas: No focal mass lesion. No dilatation of the main duct. No
intraparenchymal cyst. No peripancreatic edema.

Spleen: No splenomegaly. No focal mass lesion.

Adrenals/Urinary Tract: No adrenal nodule or mass. Kidneys
unremarkable. No evidence for hydroureter. Bladder is distended and
partially obscured by streak artifact from right hip replacement.

Stomach/Bowel: Stomach is nondistended. No gastric wall thickening.
No evidence of outlet obstruction. Duodenum is normally positioned
as is the ligament of Treitz. No small bowel wall thickening. No
small bowel dilatation. The terminal ileum is normal. The appendix
is not visualized, but there is no edema or inflammation in the
region of the cecum. Colon appears diffusely fluid-filled but there
is not substantial colonic wall thickening.

Vascular/Lymphatic: There is abdominal aortic atherosclerosis
without aneurysm. There is no gastrohepatic or hepatoduodenal
ligament lymphadenopathy. No intraperitoneal or retroperitoneal
lymphadenopathy. No pelvic sidewall lymphadenopathy.

Reproductive: Uterus surgically absent.  There is no adnexal mass.

Other: No evidence for intraperitoneal free fluid.

Musculoskeletal: Status post right total hip replacement. Bones are
demineralized. Bone windows reveal no worrisome lytic or sclerotic
osseous lesions.
IMPRESSION: 1. Colon is diffusely fluid-filled compatible with the reported
clinical history of diarrhea. No appreciable colonic wall
thickening. Small bowel is normal by CT imaging.
2. Otherwise no acute findings in the abdomen.
3.  Aortic Atherosclerois (PL2XF-170.0)

## 2019-02-09 DIAGNOSIS — R197 Diarrhea, unspecified: Secondary | ICD-10-CM | POA: Diagnosis not present

## 2019-07-01 DIAGNOSIS — M810 Age-related osteoporosis without current pathological fracture: Secondary | ICD-10-CM | POA: Diagnosis not present

## 2019-07-01 DIAGNOSIS — E559 Vitamin D deficiency, unspecified: Secondary | ICD-10-CM | POA: Diagnosis not present

## 2019-07-01 DIAGNOSIS — Z0000 Encounter for general adult medical examination without abnormal findings: Secondary | ICD-10-CM | POA: Diagnosis not present

## 2019-07-01 DIAGNOSIS — E785 Hyperlipidemia, unspecified: Secondary | ICD-10-CM | POA: Diagnosis not present

## 2019-07-01 DIAGNOSIS — K589 Irritable bowel syndrome without diarrhea: Secondary | ICD-10-CM | POA: Diagnosis not present

## 2019-07-01 DIAGNOSIS — J449 Chronic obstructive pulmonary disease, unspecified: Secondary | ICD-10-CM | POA: Diagnosis not present

## 2019-08-25 ENCOUNTER — Ambulatory Visit: Payer: PPO

## 2019-09-03 ENCOUNTER — Ambulatory Visit: Payer: PPO | Attending: Internal Medicine

## 2020-01-11 DIAGNOSIS — Z1231 Encounter for screening mammogram for malignant neoplasm of breast: Secondary | ICD-10-CM | POA: Diagnosis not present

## 2020-02-10 DIAGNOSIS — R197 Diarrhea, unspecified: Secondary | ICD-10-CM | POA: Diagnosis not present

## 2020-04-26 DIAGNOSIS — H353130 Nonexudative age-related macular degeneration, bilateral, stage unspecified: Secondary | ICD-10-CM | POA: Diagnosis not present

## 2020-07-20 DIAGNOSIS — K589 Irritable bowel syndrome without diarrhea: Secondary | ICD-10-CM | POA: Diagnosis not present

## 2020-07-20 DIAGNOSIS — E785 Hyperlipidemia, unspecified: Secondary | ICD-10-CM | POA: Diagnosis not present

## 2020-07-20 DIAGNOSIS — Z0000 Encounter for general adult medical examination without abnormal findings: Secondary | ICD-10-CM | POA: Diagnosis not present

## 2020-07-20 DIAGNOSIS — E559 Vitamin D deficiency, unspecified: Secondary | ICD-10-CM | POA: Diagnosis not present

## 2020-07-20 DIAGNOSIS — J449 Chronic obstructive pulmonary disease, unspecified: Secondary | ICD-10-CM | POA: Diagnosis not present

## 2020-10-04 DIAGNOSIS — Z471 Aftercare following joint replacement surgery: Secondary | ICD-10-CM | POA: Diagnosis not present

## 2020-10-04 DIAGNOSIS — Z96641 Presence of right artificial hip joint: Secondary | ICD-10-CM | POA: Diagnosis not present

## 2020-10-04 DIAGNOSIS — M7061 Trochanteric bursitis, right hip: Secondary | ICD-10-CM | POA: Diagnosis not present

## 2020-10-06 ENCOUNTER — Telehealth: Payer: Self-pay | Admitting: Orthopaedic Surgery

## 2020-10-06 NOTE — Telephone Encounter (Signed)
Left voicemail for [name] to call me back.

## 2020-10-06 NOTE — Telephone Encounter (Signed)
Pt son [name] called and states that his mother had hip surgery and they are wondering if Dr.Xu can do like a reconstruction of the hip. They do not want to go back to the same doctor that did the surgery. They specifically asked for Dr.XU. If you could please give her son a call back at [phone]

## 2020-10-07 ENCOUNTER — Telehealth: Payer: Self-pay | Admitting: Orthopaedic Surgery

## 2020-10-07 NOTE — Telephone Encounter (Signed)
Patient's son [name] returning Dr. Roda Shutters phone call this morning. He states Dr. Roda Shutters called and left a message last night for him to return his call concerning his mother. Phone number is [phone]. Please call [name] as soon as possible.

## 2020-10-07 NOTE — Telephone Encounter (Signed)
Can you work him in with [place] next week please

## 2020-10-07 NOTE — Telephone Encounter (Signed)
Please call patient for an appt.  Thanks.

## 2020-10-10 ENCOUNTER — Encounter: Payer: Self-pay | Admitting: Physician Assistant

## 2020-10-10 ENCOUNTER — Ambulatory Visit: Payer: PPO | Admitting: Orthopaedic Surgery

## 2020-10-10 ENCOUNTER — Ambulatory Visit (INDEPENDENT_AMBULATORY_CARE_PROVIDER_SITE_OTHER): Payer: PPO

## 2020-10-10 DIAGNOSIS — M545 Low back pain, unspecified: Secondary | ICD-10-CM | POA: Diagnosis not present

## 2020-10-10 DIAGNOSIS — M25551 Pain in right hip: Secondary | ICD-10-CM | POA: Diagnosis not present

## 2020-10-10 NOTE — Progress Notes (Signed)
Office Visit Note   Patient: [name]           Date of Birth: 03-31-1941           MRN: [id number] Visit Date: 10/10/2020              Requested by: No referring provider defined for this encounter. PCP: No primary care provider on file.   Assessment & Plan: Visit Diagnoses:  1. Low back pain, unspecified back pain laterality, unspecified chronicity, unspecified whether sciatica present   2. 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  At this point, will need to obtain a bone scan to further evaluate this.  We have discussed with the daughter and we will be in touch with them regarding the results of the scan.    Follow-Up Instructions: Return if symptoms worsen or fail to improve, for next week.   Orders:  Orders Placed This Encounter  Procedures  . XR HIP UNILAT W OR W/O PELVIS 2-3 VIEWS RIGHT  . NM Bone Scan 3 Phase  . Ambulatory referral to Physical Medicine Rehab   No orders of the defined types were placed in this encounter.     Procedures: No procedures performed   Clinical Data: No additional findings.   Subjective: Chief Complaint  Patient presents with  . Right Hip - Pain    HPI patient is a pleasant 80 year old who is here today with her daughter.  She is status post right total hip replacement by Dr. Lequita Halt in 2013.  She had continued pain following the surgery which was located to the groin.  Her pain never got better so she was seen at [facility] where subsequent IT band surgery was performed in 2014.  This did give her relief to the groin which lasted until about 6 months ago.  The groin pain returned and has progressively worsened.  The pain occurs primarily with any flexion of the hip as well as getting in and out of a car going up and down stairs.  She has associated weakness.  She has been taking Aleve without significant relief of symptoms.  No paresthesias.  She was seen back at [facility] recently where x-rays were  obtained which showed loosening of the prosthetic stem.  She was referred to another doctor there for revision arthroplasty for which she does not have an appointment until next month.  Review of Systems as detailed in HPI.  All others reviewed and are negative.   Objective: Vital Signs: There were no vitals taken for this visit.  Physical Exam well-developed well-nourished female no acute distress.  Alert and oriented x3.  Ortho Exam right hip exam shows a positive logroll, positive FADIR and positive straight leg raise.  She does have mild pain with resisted hip flexion, abduction and abduction.  She is neurovascular intact distally.  Specialty Comments:  No specialty comments available.  Imaging: XR HIP UNILAT W OR W/O PELVIS 2-3 VIEWS RIGHT  Result Date: 10/10/2020 X-rays demonstrate subsidence of the femoral stem    PMFS History: Patient Active Problem List   Diagnosis Date Noted  . 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 Femur fracture, right (HCC) 05/19/2012  . Postop Hypokalemia 10/04/2011  . Postop Acute blood loss anemia 10/02/2011  . Postop Hyponatremia 10/02/2011  . OA (osteoarthritis) of knee 10/01/2011  . 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 Dyspnea on exertion 08/22/2011  . Heart palpitations 08/22/2011  . Preoperative clearance 08/16/2011  .  Postherpetic neuralgia 11/23/2010  . H. pylori infection 11/15/2010  . Lymphadenopathy 11/01/2010  . Emphysema 11/01/2010  . History of chicken pox   . History of measles   . Chest pain 10/24/2010  . Palpitations 10/24/2010  . Vitamin D deficiency 05/19/2009  . HYPOKALEMIA 05/19/2009  . ANEMIA 05/19/2009  . ANXIETY DEPRESSION 05/19/2009  . Irritable bowel syndrome 05/19/2009  . ARTHRITIS, HIP 05/19/2009  . Diarrhea 05/19/2009  . COLITIS 05/22/2007  . HYPERTHYROIDISM 03/11/2007  . HYPERLIPIDEMIA 03/11/2007  . WEIGHT LOSS 03/11/2007   Past Medical History:  Diagnosis Date  . ANEMIA 05/19/2009  . ANXIETY  DEPRESSION 05/19/2009  . Arthritis    right hip   . ARTHRITIS, HIP 05/19/2009  . Chest pain 10/24/2010  . COLITIS 05/22/2007  . COPD (chronic obstructive pulmonary disease) (HCC)   . Diarrhea 05/19/2009  . Emphysema 11/01/2010  . Femur fracture, right (HCC) 05/19/2012  . H. pylori infection 11/15/2010  . History of chicken pox   . History of measles   . HYPERLIPIDEMIA 03/11/2007  . HYPERTHYROIDISM 03/11/2007  . HYPOKALEMIA 05/19/2009  . Irritable bowel syndrome 05/19/2009  . Lymphadenopathy 11/01/2010  . Palpitations 10/24/2010  . Postherpetic neuralgia 11/23/2010  . Preoperative clearance 08/16/2011  . Shingles 11/15/2010  . Urticaria 11/15/2010  . UTI 03/11/2007  . VITAMIN D DEFICIENCY 05/19/2009  . WEIGHT LOSS 03/11/2007    Family History  Problem Relation Age of Onset  . Alzheimer's disease Mother   . Arthritis Mother   . Other Father        blood clot  . Pulmonary embolism Father   . Arthritis Father   . Cancer Sister 69       breast  . Emphysema Sister   . Cancer Maternal Aunt        breast  . Heart attack Paternal Grandfather   . Cancer Sister        bladder ca  . Emphysema Sister   . Cancer Sister        breast  . Emphysema Sister   . GER disease Brother   . Arthritis Sister     Past Surgical History:  Procedure Laterality Date  . ABDOMINAL HYSTERECTOMY     total  . APPENDECTOMY    . BREAST BIOPSY    . OTHER SURGICAL HISTORY     bilateral breat biopsy wtih- fibrocystic breast disease   . TOTAL HIP ARTHROPLASTY  10/01/2011   Procedure: TOTAL HIP ARTHROPLASTY;  Surgeon: Loanne Drilling, MD;  Location: [facility];  Service: Orthopedics;  Laterality: Right;   Social History   Occupational History  . Not on file  Tobacco Use  . Smoking status: Never Smoker  . Smokeless tobacco: Never Used  Vaping Use  . Vaping Use: Never used  Substance and Sexual Activity  . Alcohol use: No  . Drug use: No  . Sexual activity: Never

## 2020-10-11 NOTE — Telephone Encounter (Signed)
I saw this patient yesterday who was here with her daughter from [place].  Total hip by alusio in 2013 and had continued pain.  Then saw an md at [facility] in 2014 who operated on it band/troch bursa I believe.  Good until 6 months ago.  Has had continued groin pain.  Looks like stem is loose so I ordered a bone scan. They were wondering if you could revise this and asked if it would be possible if you could call the son to discuss a little more when you get back from vacation.

## 2020-10-11 NOTE — Telephone Encounter (Signed)
Agree with bone scan.  There's a little lucency at the shoulder of the implant the stem looks well fixed distally.  We'll see what the bone scan shows.  Thanks.

## 2020-10-12 NOTE — Telephone Encounter (Signed)
Ok, sounds good.  Would you like for them to just come back in after the scan to discuss whether this needs a revision and if you are able to do?

## 2020-10-12 NOTE — Telephone Encounter (Signed)
Yeah let's have them come back after bone scan.  Thanks.

## 2020-10-13 NOTE — Telephone Encounter (Signed)
[name].

## 2020-10-13 NOTE — Telephone Encounter (Signed)
[name], can you let the son/daughter know that I have discussed this with dr. Roda Shutters and that he would like for them to come in after bone scan to further discuss the results and her situation

## 2020-10-19 ENCOUNTER — Telehealth: Payer: Self-pay | Admitting: Physical Medicine and Rehabilitation

## 2020-10-19 NOTE — Telephone Encounter (Signed)
Called patient's son and answered his questions regarding the injection.

## 2020-10-19 NOTE — Telephone Encounter (Signed)
Harrison Mons the pts son called asking for a CB in regards to the pts appt on 11/01/20. Harrison Mons is stating he would like to try to get an understanding of what's going to happen and what to expect after.   [phone]

## 2020-10-26 ENCOUNTER — Encounter (HOSPITAL_COMMUNITY)
Admission: RE | Admit: 2020-10-26 | Discharge: 2020-10-26 | Disposition: A | Discharge status: Home / Self Care | Payer: PPO | Source: Ambulatory Visit | Attending: Physician Assistant | Admitting: Physician Assistant

## 2020-10-26 ENCOUNTER — Other Ambulatory Visit: Payer: Self-pay

## 2020-10-26 DIAGNOSIS — M25551 Pain in right hip: Secondary | ICD-10-CM | POA: Insufficient documentation

## 2020-10-26 DIAGNOSIS — Z96641 Presence of right artificial hip joint: Secondary | ICD-10-CM | POA: Diagnosis not present

## 2020-10-26 MED ORDER — TECHNETIUM TC 99M MEDRONATE IV KIT
20.0000 | PACK | Freq: Once | INTRAVENOUS | Status: AC | PRN
  Administered 2020-10-26: 21.2 via INTRAVENOUS

## 2020-10-27 NOTE — Progress Notes (Signed)
F/u to discuss

## 2020-10-28 ENCOUNTER — Encounter: Payer: Self-pay | Admitting: Orthopaedic Surgery

## 2020-10-28 ENCOUNTER — Other Ambulatory Visit: Payer: Self-pay

## 2020-10-28 ENCOUNTER — Ambulatory Visit: Payer: PPO | Admitting: Orthopaedic Surgery

## 2020-10-28 DIAGNOSIS — M25551 Pain in right hip: Secondary | ICD-10-CM

## 2020-10-28 NOTE — Progress Notes (Signed)
Office Visit Note   Patient: [name]           Date of Birth: Oct 10, 1940           MRN: [id number] Visit Date: 10/28/2020              Requested by: No referring provider defined for this encounter. PCP: Farris Has, MD   Assessment & Plan: Visit Diagnoses:  1. Pain in right hip     Plan: Bone scan is negative for loosening of the prosthesis.  She is very concerned that the hip stem is too long for her anatomy but I cannot find any evidence that the hip stem is the source of her pain.  I strongly believe that her pain is related to likely a combination of trochanteric bursitis and abductor tendinopathy.  I can tell her that she is very frustrated by the ongoing pain.  I have strongly encouraged her to do physical therapy she does not want to do this as per the daughter the patient is looking for a quick fix which I do not think is possible or realistic in this case.  They do have an appointment with Dr. Dione Housekeeper at [facility] few weeks which they may keep.  From my standpoint we can see her back as needed.  I am more than happy to refer the patient to physical therapy if she changes her mind.  I have also recommended an appointment with Dr. Prince Rome for an ultrasound-guided trochanteric bursal injection.  All questions answered.  Total face to face encounter time was greater than 25 minutes and over half of this time was spent in counseling and/or coordination of care.  Follow-Up Instructions: Return for needs appt with [facility] for ultrasound guided right hip trochanteric bursal injection.   Orders:  No orders of the defined types were placed in this encounter.  No orders of the defined types were placed in this encounter.     Procedures: No procedures performed   Clinical Data: No additional findings.   Subjective: Chief Complaint  Patient presents with  . Right Hip - Pain    [name] returns today with her daughter to review recent bone scan.  Briefly she underwent a right  total hip replacement by Dr. Despina Hick in 2013 and subsequently had lateral right hip pain that was injected 3 separate times which gave temporary relief.  She then went to [facility] and in 2014 underwent an arthroscopic trochanteric bursectomy, iliopsoas release, anterior capsulectomy, anterior acetabular osteoplasty by Dr. Armen Pickup.  They report that the patient received about 6 years of significant pain relief from the surgery but unfortunately the pain returned.  Today she is complaining of mainly lateral hip pain that is tender to palpation.  It is severely limiting her function and activity.  She denies any signs or symptoms of radiculopathy.  Denies any thigh pain.  Has some mild groin pain.   Review of Systems  Constitutional: Negative.   HENT: Negative.   Eyes: Negative.   Respiratory: Negative.   Cardiovascular: Negative.   Endocrine: Negative.   Musculoskeletal: Negative.   Neurological: Negative.   Hematological: Negative.   Psychiatric/Behavioral: Negative.   All other systems reviewed and are negative.    Objective: Vital Signs: There were no vitals taken for this visit.  Physical Exam Vitals and nursing note reviewed.  Constitutional:      Appearance: She is well-developed.  Pulmonary:     Effort: Pulmonary effort is normal.  Skin:  General: Skin is warm.     Capillary Refill: Capillary refill takes less than 2 seconds.  Neurological:     Mental Status: She is alert and oriented to person, place, and time.  Psychiatric:        Behavior: Behavior normal.        Thought Content: Thought content normal.        Judgment: Judgment normal.     Ortho Exam Right hip exam shows a fully healed surgical scar.  She has painless logroll.  She has difficulty with relaxing which limited the exam somewhat.  Negative sciatic tension signs.  She is very tender to palpation lateral aspect of the greater trochanter.  Pain with resisted hip abduction.  Lumbar spine is nontender. Specialty  Comments:  No specialty comments available.  Imaging: No results found.   PMFS History: Patient Active Problem List   Diagnosis Date Noted  . 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 Femur fracture, right (HCC) 05/19/2012  . Postop Hypokalemia 10/04/2011  . Postop Acute blood loss anemia 10/02/2011  . Postop Hyponatremia 10/02/2011  . OA (osteoarthritis) of knee 10/01/2011  . 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 Dyspnea on exertion 08/22/2011  . Heart palpitations 08/22/2011  . Preoperative clearance 08/16/2011  . Postherpetic neuralgia 11/23/2010  . H. pylori infection 11/15/2010  . Lymphadenopathy 11/01/2010  . Emphysema 11/01/2010  . History of chicken pox   . History of measles   . Chest pain 10/24/2010  . Palpitations 10/24/2010  . Vitamin D deficiency 05/19/2009  . HYPOKALEMIA 05/19/2009  . ANEMIA 05/19/2009  . ANXIETY DEPRESSION 05/19/2009  . Irritable bowel syndrome 05/19/2009  . ARTHRITIS, HIP 05/19/2009  . Diarrhea 05/19/2009  . COLITIS 05/22/2007  . HYPERTHYROIDISM 03/11/2007  . HYPERLIPIDEMIA 03/11/2007  . WEIGHT LOSS 03/11/2007   Past Medical History:  Diagnosis Date  . ANEMIA 05/19/2009  . ANXIETY DEPRESSION 05/19/2009  . Arthritis    right hip   . ARTHRITIS, HIP 05/19/2009  . Chest pain 10/24/2010  . COLITIS 05/22/2007  . COPD (chronic obstructive pulmonary disease) (HCC)   . Diarrhea 05/19/2009  . Emphysema 11/01/2010  . Femur fracture, right (HCC) 05/19/2012  . H. pylori infection 11/15/2010  . History of chicken pox   . History of measles   . HYPERLIPIDEMIA 03/11/2007  . HYPERTHYROIDISM 03/11/2007  . HYPOKALEMIA 05/19/2009  . Irritable bowel syndrome 05/19/2009  . Lymphadenopathy 11/01/2010  . Palpitations 10/24/2010  . Postherpetic neuralgia 11/23/2010  . Preoperative clearance 08/16/2011  . Shingles 11/15/2010  . Urticaria 11/15/2010  . UTI 03/11/2007  . VITAMIN D DEFICIENCY 05/19/2009  . WEIGHT LOSS 03/11/2007    Family History  Problem  Relation Age of Onset  . Alzheimer's disease Mother   . Arthritis Mother   . Other Father        blood clot  . Pulmonary embolism Father   . Arthritis Father   . Cancer Sister 72       breast  . Emphysema Sister   . Cancer Maternal Aunt        breast  . Heart attack Paternal Grandfather   . Cancer Sister        bladder ca  . Emphysema Sister   . Cancer Sister        breast  . Emphysema Sister   . GER disease Brother   . Arthritis Sister     Past Surgical History:  Procedure Laterality Date  . ABDOMINAL HYSTERECTOMY     total  .  APPENDECTOMY    . BREAST BIOPSY    . OTHER SURGICAL HISTORY     bilateral breat biopsy wtih- fibrocystic breast disease   . TOTAL HIP ARTHROPLASTY  10/01/2011   Procedure: TOTAL HIP ARTHROPLASTY;  Surgeon: Loanne Drilling, MD;  Location: [facility];  Service: Orthopedics;  Laterality: Right;   Social History   Occupational History  . Not on file  Tobacco Use  . Smoking status: Never Smoker  . Smokeless tobacco: Never Used  Vaping Use  . Vaping Use: Never used  Substance and Sexual Activity  . Alcohol use: No  . Drug use: No  . Sexual activity: Never

## 2020-10-31 ENCOUNTER — Other Ambulatory Visit: Payer: Self-pay | Admitting: Surgery

## 2020-10-31 DIAGNOSIS — M76891 Other specified enthesopathies of right lower limb, excluding foot: Secondary | ICD-10-CM

## 2020-11-01 ENCOUNTER — Ambulatory Visit: Payer: PPO | Admitting: Physical Medicine and Rehabilitation

## 2020-11-04 ENCOUNTER — Other Ambulatory Visit: Payer: Self-pay

## 2020-11-04 ENCOUNTER — Ambulatory Visit
Admission: RE | Admit: 2020-11-04 | Discharge: 2020-11-04 | Disposition: A | Discharge status: Home / Self Care | Payer: PPO | Source: Ambulatory Visit | Attending: Surgery | Admitting: Surgery

## 2020-11-04 DIAGNOSIS — Z96641 Presence of right artificial hip joint: Secondary | ICD-10-CM | POA: Diagnosis not present

## 2020-11-04 DIAGNOSIS — Z471 Aftercare following joint replacement surgery: Secondary | ICD-10-CM | POA: Diagnosis not present

## 2020-11-04 DIAGNOSIS — M76891 Other specified enthesopathies of right lower limb, excluding foot: Secondary | ICD-10-CM

## 2020-11-08 ENCOUNTER — Ambulatory Visit (INDEPENDENT_AMBULATORY_CARE_PROVIDER_SITE_OTHER): Payer: PPO | Admitting: Family Medicine

## 2020-11-08 ENCOUNTER — Ambulatory Visit: Payer: Self-pay

## 2020-11-08 ENCOUNTER — Other Ambulatory Visit: Payer: Self-pay

## 2020-11-08 DIAGNOSIS — M25551 Pain in right hip: Secondary | ICD-10-CM | POA: Diagnosis not present

## 2020-11-08 NOTE — Progress Notes (Signed)
Subjective: She is here for right hip ultrasound-guided greater trochanter injection.  Pain status post hip arthroplasty in 2013.  She had IT band release 1 year later.  She was doing well until the last few months.  She had an MRI yesterday showing no tendon tears, no bursal fluid.  No sign of hip infection.  Objective: She is point tender on the posterior lateral aspect of the greater trochanter.  She has weakness with bilateral hip abduction.  Procedure: Ultrasound-guided right greater trochanter injection: After sterile prep with Betadine, injected 4 cc 0.25% bupivacaine and 40 mg Depo-Medrol into the gluteus medius tendon at the attachment on the greater trochanter.  She had very good immediate relief.  She was instructed on hip abduction exercises to do on a daily basis starting next week.

## 2020-12-02 ENCOUNTER — Other Ambulatory Visit: Payer: Self-pay

## 2020-12-02 ENCOUNTER — Telehealth: Payer: Self-pay | Admitting: Orthopaedic Surgery

## 2020-12-02 NOTE — Telephone Encounter (Signed)
CD was given to you. Please call. Thanks.

## 2020-12-02 NOTE — Telephone Encounter (Signed)
Patient's son Harrison Mons asked for a call back from Dr. Roda Shutters or Marisue Ivan with next plan of action for patient. Phone number is [phone]

## 2020-12-02 NOTE — Telephone Encounter (Signed)
Patients son will discuss with mother and call us back.

## 2020-12-02 NOTE — Telephone Encounter (Signed)
I reviewed MRI which doesn't show much of anything.  The other only imaging I can think of is to get a L spine MRI to rule out the back as the source of her pain.

## 2020-12-08 ENCOUNTER — Telehealth: Payer: Self-pay | Admitting: Family Medicine

## 2020-12-08 NOTE — Telephone Encounter (Signed)
Patient called requesting a call back. Patient has medical question about her recent injection. Please call patient at [phone]

## 2020-12-08 NOTE — Telephone Encounter (Signed)
I called the patient: she says the injection in the trochanteric bursa on 11/08/20 did not give more than a day's relief. She rested for about 2 weeks after the injection, but the pain has persisted. She has checked with Dr. Roda Shutters about the next step and he suggested MRI Lsp to see if the back is the cause of her pain. The patient says her back does not hurt at all -- it is the right lateral hip and down the leg. She does not want to have another MRI. What she asks is if it is possible to get another injection in a slightly different spot, that may bring more relief. If so, how long does she need to wait for this?

## 2020-12-09 NOTE — Telephone Encounter (Signed)
I called the patient and advised her of Dr. Prince Rome' response. The patient is interested in trying another hip injection to see if it will help (if insurance will cover it), and then if she still does not feel any better she will consider the Lsp MRI.   Drinda Butts, would it be possible to check to see if [employer] would cover her having another steroid injection in the hip under [place]-guidance?

## 2020-12-09 NOTE — Telephone Encounter (Signed)
We could try that (another hip injection), but even though her back doesn't hurt, when patients don't get relief from the injection in the side of the hip, we often find that the pain is actually coming from a pinched nerve in the spine.  So MRI of the spine can help determine that, and then if a nerve is being pinched, we can refer her for an ESI.

## 2020-12-13 NOTE — Telephone Encounter (Signed)
Tried calling the patient at the home number - no answer. Will try again later.

## 2020-12-14 ENCOUNTER — Telehealth: Payer: Self-pay | Admitting: Family Medicine

## 2020-12-14 NOTE — Telephone Encounter (Signed)
Left voice mail to call me back

## 2020-12-14 NOTE — Telephone Encounter (Signed)
Duplicate - see other message regarding hip injection appointment.

## 2020-12-14 NOTE — Telephone Encounter (Signed)
Patient returned call asked for a call back. The number to contact patient is [phone]

## 2020-12-14 NOTE — Telephone Encounter (Signed)
The patient has been scheduled for 12/20/20 at 11:00 with Dr. Prince Rome for this repeat injection in the right hip.

## 2020-12-20 ENCOUNTER — Encounter: Payer: Self-pay | Admitting: Family Medicine

## 2020-12-20 ENCOUNTER — Ambulatory Visit: Payer: Self-pay

## 2020-12-20 ENCOUNTER — Other Ambulatory Visit: Payer: Self-pay

## 2020-12-20 ENCOUNTER — Ambulatory Visit (INDEPENDENT_AMBULATORY_CARE_PROVIDER_SITE_OTHER): Payer: PPO | Admitting: Family Medicine

## 2020-12-20 DIAGNOSIS — M25551 Pain in right hip: Secondary | ICD-10-CM

## 2020-12-20 NOTE — Progress Notes (Signed)
Office Visit Note   Patient: [name]           Date of Birth: [birth year]           MRN: [id number] Visit Date: 12/20/2020 Requested by: Farris Has, MD [address] [place],  [place] [address] PCP: Farris Has, MD  Subjective: Chief Complaint  Patient presents with  . Right Hip - Pain    Repeat cortisone injection, possibly in a different location. S/p greater troch injection on 11/08/20. Continues to have pain. Would like to try another injection to r/o hip issues, prior to having any lsp studies done to evaluate if that is the source of her pain.    HPI: She's here with persistent right hip pain.  Greater troch injection helped, but not a lot.  Continued anterolateral pain.               ROS:   All other systems were reviewed and are negative.  Objective: Vital Signs: There were no vitals taken for this visit.  Physical Exam:  General:  Alert and oriented, in no acute distress. Pulm:  Breathing unlabored. Psy:  Normal mood, congruent affect.  Right hip:  Mild GT tenderness.  Most pain is with passive flexion and IR.  Mild gluteus medius tenderness.    Imaging: US Guided Needle Placement - No Linked Charges  Result Date: 12/20/2020 Ultrasound guided injection is preferred based studies that show increased duration, increased effect, greater accuracy, decreased procedural pain, increased response rate, and decreased cost with ultrasound guided versus blind injection.   Verbal informed consent obtained.  Time-out conducted.  Noted no overlying erythema, induration, or other signs of local infection. Ultrasound-guided right hip injection: After sterile prep with Betadine, injected 4 cc 0.25% bupivacaine without epinephrine and 6 mg betamethasone using a 22-gauge spinal needle, passing the needle to the anterior joint capsule.  She had modest improvement during the anesthetic phase.    Assessment & Plan: 1.  Ongoing right hip pain - Elected to inject  near the anterior joint capsule today.  If still no relief, consider lumbar workup.     Procedures: No procedures performed        PMFS History: Patient Active Problem List   Diagnosis Date Noted  . 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 Femur fracture, right (HCC) 05/19/2012  . Postop Hypokalemia 10/04/2011  . Postop Acute blood loss anemia 10/02/2011  . Postop Hyponatremia 10/02/2011  . OA (osteoarthritis) of knee 10/01/2011  . 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 Dyspnea on exertion 08/22/2011  . Heart palpitations 08/22/2011  . Preoperative clearance 08/16/2011  . Postherpetic neuralgia 11/23/2010  . H. pylori infection 11/15/2010  . Lymphadenopathy 11/01/2010  . Emphysema 11/01/2010  . History of chicken pox   . History of measles   . Chest pain 10/24/2010  . Palpitations 10/24/2010  . Vitamin D deficiency 05/19/2009  . HYPOKALEMIA 05/19/2009  . ANEMIA 05/19/2009  . ANXIETY DEPRESSION 05/19/2009  . Irritable bowel syndrome 05/19/2009  . ARTHRITIS, HIP 05/19/2009  . Diarrhea 05/19/2009  . COLITIS 05/22/2007  . HYPERTHYROIDISM 03/11/2007  . HYPERLIPIDEMIA 03/11/2007  . WEIGHT LOSS 03/11/2007   Past Medical History:  Diagnosis Date  . ANEMIA 05/19/2009  . ANXIETY DEPRESSION 05/19/2009  . Arthritis    right hip   . ARTHRITIS, HIP 05/19/2009  . Chest pain 10/24/2010  . COLITIS 05/22/2007  . COPD (chronic obstructive pulmonary disease) (HCC)   . Diarrhea 05/19/2009  . Emphysema 11/01/2010  .  Femur fracture, right (HCC) 05/19/2012  . H. pylori infection 11/15/2010  . History of chicken pox   . History of measles   . HYPERLIPIDEMIA 03/11/2007  . HYPERTHYROIDISM 03/11/2007  . HYPOKALEMIA 05/19/2009  . Irritable bowel syndrome 05/19/2009  . Lymphadenopathy 11/01/2010  . Palpitations 10/24/2010  . Postherpetic neuralgia 11/23/2010  . Preoperative clearance 08/16/2011  . Shingles 11/15/2010  . Urticaria 11/15/2010  . UTI 03/11/2007  . VITAMIN D DEFICIENCY 05/19/2009   . WEIGHT LOSS 03/11/2007    Family History  Problem Relation Age of Onset  . Alzheimer's disease Mother   . Arthritis Mother   . Other Father        blood clot  . Pulmonary embolism Father   . Arthritis Father   . Cancer Sister 82       breast  . Emphysema Sister   . Cancer Maternal Aunt        breast  . Heart attack Paternal Grandfather   . Cancer Sister        bladder ca  . Emphysema Sister   . Cancer Sister        breast  . Emphysema Sister   . GER disease Brother   . Arthritis Sister     Past Surgical History:  Procedure Laterality Date  . ABDOMINAL HYSTERECTOMY     total  . APPENDECTOMY    . BREAST BIOPSY    . OTHER SURGICAL HISTORY     bilateral breat biopsy wtih- fibrocystic breast disease   . TOTAL HIP ARTHROPLASTY  10/01/2011   Procedure: TOTAL HIP ARTHROPLASTY;  Surgeon: Loanne Drilling, MD;  Location: [facility];  Service: Orthopedics;  Laterality: Right;   Social History   Occupational History  . Not on file  Tobacco Use  . Smoking status: Never Smoker  . Smokeless tobacco: Never Used  Vaping Use  . Vaping Use: Never used  Substance and Sexual Activity  . Alcohol use: No  . Drug use: No  . Sexual activity: Never

## 2020-12-27 ENCOUNTER — Telehealth: Payer: Self-pay

## 2020-12-27 NOTE — Telephone Encounter (Signed)
Patient called to tell Dr. Prince Rome that the shot has helped so far.  She also wanted to let him know that she did have a little reaction the night after the shot with itching and burning but that has subsided and she is fine now.  She stated that you were a wonderful doctor and thank you. She just wanted to follow up and let you know how things were going.

## 2021-01-11 ENCOUNTER — Encounter (HOSPITAL_BASED_OUTPATIENT_CLINIC_OR_DEPARTMENT_OTHER): Payer: Self-pay

## 2021-01-11 ENCOUNTER — Other Ambulatory Visit: Payer: Self-pay

## 2021-01-11 ENCOUNTER — Emergency Department (HOSPITAL_BASED_OUTPATIENT_CLINIC_OR_DEPARTMENT_OTHER)
Admission: EM | Admit: 2021-01-11 | Discharge: 2021-01-11 | Disposition: A | Discharge status: Home / Self Care | Payer: PPO | Attending: Emergency Medicine | Admitting: Emergency Medicine

## 2021-01-11 DIAGNOSIS — J449 Chronic obstructive pulmonary disease, unspecified: Secondary | ICD-10-CM | POA: Diagnosis not present

## 2021-01-11 DIAGNOSIS — R42 Dizziness and giddiness: Secondary | ICD-10-CM | POA: Diagnosis not present

## 2021-01-11 DIAGNOSIS — M7989 Other specified soft tissue disorders: Secondary | ICD-10-CM | POA: Diagnosis present

## 2021-01-11 DIAGNOSIS — R600 Localized edema: Secondary | ICD-10-CM | POA: Diagnosis not present

## 2021-01-11 DIAGNOSIS — Z96641 Presence of right artificial hip joint: Secondary | ICD-10-CM | POA: Insufficient documentation

## 2021-01-11 LAB — CBC WITH DIFFERENTIAL/PLATELET
Abs Immature Granulocytes: 0.01 10*3/uL (ref 0.00–0.07)
Basophils Absolute: 0 10*3/uL (ref 0.0–0.1)
Basophils Relative: 0 %
Eosinophils Absolute: 0 10*3/uL (ref 0.0–0.5)
Eosinophils Relative: 0 %
HCT: 40.6 % (ref 36.0–46.0)
Hemoglobin: 13.6 g/dL (ref 12.0–15.0)
Immature Granulocytes: 0 %
Lymphocytes Relative: 17 %
Lymphs Abs: 1 10*3/uL (ref 0.7–4.0)
MCH: 33.7 pg (ref 26.0–34.0)
MCHC: 33.5 g/dL (ref 30.0–36.0)
MCV: 100.5 fL — ABNORMAL HIGH (ref 80.0–100.0)
Monocytes Absolute: 0.3 10*3/uL (ref 0.1–1.0)
Monocytes Relative: 5 %
Neutro Abs: 4.3 10*3/uL (ref 1.7–7.7)
Neutrophils Relative %: 78 %
Platelets: 233 10*3/uL (ref 150–400)
RBC: 4.04 MIL/uL (ref 3.87–5.11)
RDW: 11.9 % (ref 11.5–15.5)
WBC: 5.7 10*3/uL (ref 4.0–10.5)
nRBC: 0 % (ref 0.0–0.2)

## 2021-01-11 LAB — COMPREHENSIVE METABOLIC PANEL
ALT: 15 U/L (ref 0–44)
AST: 16 U/L (ref 15–41)
Albumin: 4.5 g/dL (ref 3.5–5.0)
Alkaline Phosphatase: 66 U/L (ref 38–126)
Anion gap: 8 (ref 5–15)
BUN: 11 mg/dL (ref 8–23)
CO2: 28 mmol/L (ref 22–32)
Calcium: 9.6 mg/dL (ref 8.9–10.3)
Chloride: 100 mmol/L (ref 98–111)
Creatinine, Ser: 0.53 mg/dL (ref 0.44–1.00)
GFR, Estimated: >60 mL/min (ref >60)
Glucose, Bld: 97 mg/dL (ref 70–99)
Potassium: 4 mmol/L (ref 3.5–5.1)
Sodium: 136 mmol/L (ref 135–145)
Total Bilirubin: 0.7 mg/dL (ref 0.3–1.2)
Total Protein: 6.8 g/dL (ref 6.5–8.1)

## 2021-01-11 LAB — BRAIN NATRIURETIC PEPTIDE: B Natriuretic Peptide: 44.6 pg/mL (ref 0.0–100.0)

## 2021-01-11 NOTE — ED Triage Notes (Signed)
She reports painless minimal swelling of bilat. L.e. she reports no pain or discomfort, nor any fever, shortness of breath, nor any other sign of current illness.

## 2021-01-11 NOTE — Discharge Instructions (Addendum)
Your blood work was normal including normal kidney function, normal electrolytes, normal liver function and no signs of heart failure.  Minimize salt intake in your meals Elevate legs when relaxing, sitting or lying down. Continue to stay active as you usually do. Follow-up with your local doctor for further evaluation and return to the emergency room at [facility] if your vertigo returns or you develop other strokelike symptoms which may include speech changes, balance problems, double vision or new concerns.

## 2021-01-11 NOTE — ED Notes (Signed)
Onset of leg swelling yesterday.  Little swelling noted to legs bilaterally.  Circulation good.  Also states had episode of dizziness on Sunday that has resolved.

## 2021-01-11 NOTE — ED Provider Notes (Signed)
[facility] EMERGENCY DEPT Provider Note   CSN: [id number] Arrival date & time: 01/11/21  0947     History Chief Complaint  Patient presents with   Leg Swelling    [name] is a 80 y.o. female.  Patient presents with mild lower extremity swelling around the ankles noticed worse this morning.  No recent surgeries, no shortness of breath, no chest pain, no abdominal pain.  No history of liver or kidney disease.  Patient is active, eats well, healthy with only 1 medication she takes.  Patient did have episode of dizziness that resolved on Sunday.  No history of stroke.  Currently no stroke symptoms or signs.      Past Medical History:  Diagnosis Date   ANEMIA 05/19/2009   ANXIETY DEPRESSION 05/19/2009   Arthritis    right hip    ARTHRITIS, HIP 05/19/2009   Chest pain 10/24/2010   COLITIS 05/22/2007   COPD (chronic obstructive pulmonary disease) (HCC)    Diarrhea 05/19/2009   Emphysema 11/01/2010   Femur fracture, right (HCC) 05/19/2012   H. 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 pylori infection  11/15/2010   Lymphadenopathy 11/01/2010   Emphysema 11/01/2010   History of chicken pox    History of measles    Chest pain 10/24/2010   Palpitations 10/24/2010   Vitamin D deficiency 05/19/2009   HYPOKALEMIA 05/19/2009   ANEMIA 05/19/2009   ANXIETY DEPRESSION 05/19/2009   Irritable bowel syndrome 05/19/2009   ARTHRITIS, HIP 05/19/2009   Diarrhea 05/19/2009   COLITIS 05/22/2007   HYPERTHYROIDISM 03/11/2007   HYPERLIPIDEMIA 03/11/2007   WEIGHT LOSS 03/11/2007    Past Surgical History:  Procedure Laterality Date   ABDOMINAL HYSTERECTOMY     total   APPENDECTOMY     BREAST BIOPSY     OTHER SURGICAL HISTORY     bilateral breat biopsy wtih- fibrocystic breast disease    TOTAL HIP ARTHROPLASTY  10/01/2011   Procedure: TOTAL HIP ARTHROPLASTY;  Surgeon: Loanne Drilling, MD;  Location: [facility];  Service: Orthopedics;  Laterality: Right;     OB History   No obstetric history on file.     Family History  Problem Relation Age of Onset   Alzheimer's disease Mother    Arthritis Mother    Other Father        blood clot   Pulmonary embolism Father    Arthritis Father    Cancer Sister 72       breast   Emphysema Sister    Cancer Maternal Aunt        breast   Heart attack Paternal Grandfather  Cancer Sister        bladder ca   Emphysema Sister    Cancer Sister        breast   Emphysema Sister    GER disease Brother    Arthritis Sister     Social History   Tobacco Use   Smoking status: Never   Smokeless tobacco: Never  Vaping Use   Vaping Use: Never used  Substance Use Topics   Alcohol use: No   Drug use: No    Home Medications Prior to Admission medications   Medication Sig Start Date End Date Taking? Authorizing Provider  Ascorbic Acid (VITAMIN C PO) Take 1 tablet by mouth daily.    [provider]  Calcium Citrate (CALCITRATE PO) Take 1 tablet by mouth daily.    [provider]  Cholecalciferol (VITAMIN D PO) Take 1 tablet by mouth daily.     [provider]  cholestyramine light (PREVALITE) 4 GM/DOSE powder  09/11/17   [provider]  Cyanocobalamin (VITAMIN B 12 PO) Take 1 tablet by mouth daily.    [provider]  MAGNESIUM PO Take 500 mg by mouth daily.    [provider]  Melatonin 5 MG TABS Take 1 tablet by mouth daily.    [provider]  Multiple Vitamins-Minerals (CENTRUM SILVER PO) Take 1 tablet by mouth daily.    [provider]  Probiotic Product (PROBIOTIC PO) Take 1 tablet by mouth daily.    [provider]    Allergies    Fluoxetine, Sulfamethoxazole, and Tramadol  Review of Systems   Review of Systems  Constitutional:  Negative for chills and fever.  HENT:  Negative for congestion.   Eyes:  Negative for visual disturbance.  Respiratory:  Negative for shortness of breath.   Cardiovascular:  Positive for leg swelling. Negative for chest pain.  Gastrointestinal:  Negative for abdominal pain and vomiting.  Genitourinary:  Negative for dysuria and flank pain.  Musculoskeletal:  Negative for back pain, gait problem, neck pain and neck stiffness.  Skin:  Negative for rash.  Neurological:  Positive for dizziness. Negative for light-headedness and headaches.   Physical Exam Updated Vital Signs BP (!) 141/82 (BP Location: Left Arm)   Pulse 75   Temp 98.3 F (36.8 C) (Oral)   Resp 18   Ht 5\' 1"  (1.549 m)   Wt 47.2 kg   SpO2 100%   BMI 19.65 kg/m   Physical Exam Vitals and nursing note reviewed.  Constitutional:      General: She is not in acute distress.    Appearance: She is well-developed.  HENT:     Head: Normocephalic and atraumatic.     Mouth/Throat:     Mouth: Mucous membranes are dry.  Eyes:     General:        Right eye: No discharge.        Left eye: No discharge.     Conjunctiva/sclera: Conjunctivae normal.  Neck:     Trachea: No tracheal deviation.  Cardiovascular:     Rate and Rhythm: Normal rate and regular rhythm.      Heart sounds: No murmur heard. Pulmonary:     Effort: Pulmonary effort is normal.     Breath sounds: Normal breath sounds.  Abdominal:     General: There is no distension.     Palpations: Abdomen is soft.     Tenderness: There is no abdominal tenderness. There is no guarding.  Musculoskeletal:  General: Swelling present. No tenderness.     Cervical back: Normal range of motion and neck supple. No rigidity.  Skin:    General: Skin is warm.     Capillary Refill: Capillary refill takes less than 2 seconds.     Findings: No rash.     Comments: Patient has minimal edema ankles bilateral without signs of infection nonpitting.  Neurological:     General: No focal deficit present.     Mental Status: She is alert.     GCS: GCS eye subscore is 4. GCS verbal subscore is 5. GCS motor subscore is 6.     Cranial Nerves: No cranial nerve deficit.     Sensory: No sensory deficit.     Motor: No weakness.     Comments: Visual fields intact to fingers, finger-to-nose normal bilateral, heel-to-shin normal, normal coordination normal strength bilateral.  Psychiatric:        Mood and Affect: Mood normal.    ED Results / Procedures / Treatments   Labs (all labs ordered are listed, but only abnormal results are displayed) Labs Reviewed  COMPREHENSIVE METABOLIC PANEL  CBC WITH DIFFERENTIAL/PLATELET  BRAIN NATRIURETIC PEPTIDE    EKG None  Radiology No results found.  Procedures Procedures   Medications Ordered in ED Medications - No data to display  ED Course  I have reviewed the triage vital signs and the nursing notes.  Pertinent labs & imaging results that were available during my care of the patient were reviewed by me and considered in my medical decision making (see chart for details).    MDM Rules/Calculators/A&P                          Patient presents with mild ankle swelling bilateral.  Discussed likely due to humidity/heat/gravity as she ages with Delorise Shiner.  Patient  has no clinical signs of heart failure or kidney failure.  Plan to check renal function, electrolytes, liver function.  Patient has normal neurologic exam had transient dizziness that resolved on Sunday.  Discussed strict reasons return if she develops persistent vertigo or stroke symptoms and to go to the [facility], ER as she will need to see neurology/MRI.  Patient is well-appearing on exam and no signs and symptoms currently except for mild ankle edema.  No concern for blood clots at this time.  Patient has no risk factors for blood clots.  Patient will follow close with primary doctor blood work pending. Blood work very reassuring showing normal BNP no signs of heart failure, normal electrolytes, normal kidney function, no level of function.  Patient stable for outpatient follow-up.   Final Clinical Impression(s) / ED Diagnoses Final diagnoses:  Dizziness  Bilateral leg edema    Rx / DC Orders ED Discharge Orders     None        Blane Ohara, MD 01/11/21 1233

## 2021-02-08 DIAGNOSIS — R197 Diarrhea, unspecified: Secondary | ICD-10-CM | POA: Diagnosis not present

## 2021-08-04 DIAGNOSIS — E785 Hyperlipidemia, unspecified: Secondary | ICD-10-CM | POA: Diagnosis not present

## 2021-08-04 DIAGNOSIS — J449 Chronic obstructive pulmonary disease, unspecified: Secondary | ICD-10-CM | POA: Diagnosis not present

## 2021-08-04 DIAGNOSIS — Z0000 Encounter for general adult medical examination without abnormal findings: Secondary | ICD-10-CM | POA: Diagnosis not present

## 2021-08-04 DIAGNOSIS — M8580 Other specified disorders of bone density and structure, unspecified site: Secondary | ICD-10-CM | POA: Diagnosis not present

## 2021-08-04 DIAGNOSIS — K589 Irritable bowel syndrome without diarrhea: Secondary | ICD-10-CM | POA: Diagnosis not present

## 2021-08-04 DIAGNOSIS — E559 Vitamin D deficiency, unspecified: Secondary | ICD-10-CM | POA: Diagnosis not present

## 2022-01-23 DIAGNOSIS — Z1231 Encounter for screening mammogram for malignant neoplasm of breast: Secondary | ICD-10-CM | POA: Diagnosis not present

## 2022-02-05 DIAGNOSIS — R197 Diarrhea, unspecified: Secondary | ICD-10-CM | POA: Diagnosis not present

## 2022-02-06 DIAGNOSIS — H353130 Nonexudative age-related macular degeneration, bilateral, stage unspecified: Secondary | ICD-10-CM | POA: Diagnosis not present

## 2022-04-25 DIAGNOSIS — J988 Other specified respiratory disorders: Secondary | ICD-10-CM | POA: Diagnosis not present

## 2022-04-25 DIAGNOSIS — J449 Chronic obstructive pulmonary disease, unspecified: Secondary | ICD-10-CM | POA: Diagnosis not present

## 2022-04-30 ENCOUNTER — Emergency Department (HOSPITAL_BASED_OUTPATIENT_CLINIC_OR_DEPARTMENT_OTHER): Payer: PPO

## 2022-04-30 ENCOUNTER — Encounter (HOSPITAL_BASED_OUTPATIENT_CLINIC_OR_DEPARTMENT_OTHER): Payer: Self-pay | Admitting: Emergency Medicine

## 2022-04-30 ENCOUNTER — Emergency Department (HOSPITAL_BASED_OUTPATIENT_CLINIC_OR_DEPARTMENT_OTHER)
Admission: EM | Admit: 2022-04-30 | Discharge: 2022-04-30 | Disposition: A | Discharge status: Home / Self Care | Payer: PPO | Attending: Emergency Medicine | Admitting: Emergency Medicine

## 2022-04-30 ENCOUNTER — Other Ambulatory Visit (HOSPITAL_BASED_OUTPATIENT_CLINIC_OR_DEPARTMENT_OTHER): Payer: Self-pay

## 2022-04-30 ENCOUNTER — Other Ambulatory Visit: Payer: Self-pay

## 2022-04-30 DIAGNOSIS — J209 Acute bronchitis, unspecified: Secondary | ICD-10-CM | POA: Insufficient documentation

## 2022-04-30 DIAGNOSIS — J439 Emphysema, unspecified: Secondary | ICD-10-CM | POA: Diagnosis not present

## 2022-04-30 DIAGNOSIS — Z20822 Contact with and (suspected) exposure to covid-19: Secondary | ICD-10-CM | POA: Diagnosis not present

## 2022-04-30 DIAGNOSIS — R059 Cough, unspecified: Secondary | ICD-10-CM | POA: Diagnosis not present

## 2022-04-30 LAB — SARS CORONAVIRUS 2 BY RT PCR: SARS Coronavirus 2 by RT PCR: NEGATIVE

## 2022-04-30 MED ORDER — PREDNISONE 20 MG PO TABS
20.0000 mg | ORAL_TABLET | Freq: Every day | ORAL | 0 refills | Status: AC
  Filled 2022-04-30: qty 5, 5d supply, fill #0

## 2022-04-30 MED ORDER — DOXYCYCLINE HYCLATE 100 MG PO CAPS
100.0000 mg | ORAL_CAPSULE | Freq: Two times a day (BID) | ORAL | 0 refills | Status: AC
  Filled 2022-04-30: qty 20, 10d supply, fill #0

## 2022-04-30 NOTE — Discharge Instructions (Signed)
Chest x-ray overall is unremarkable.  No obvious pneumonia.  I suspect that you have bronchitis or inflammation of the airways.  I have prescribed you a different antibiotic to take as well as some steroids.  Follow-up with your primary care doctor.

## 2022-04-30 NOTE — ED Triage Notes (Signed)
Pt arrives to ED with c/o cough x3 weeks.

## 2022-04-30 NOTE — ED Provider Notes (Signed)
[facility] EMERGENCY DEPT Provider Note   CSN: [id number] Arrival date & time: 04/30/22  0731     History  Chief Complaint  Patient presents with   Cough    [name] is a 81 y.o. female.  The history is provided by the patient.  Cough Cough characteristics:  Non-productive Sputum characteristics:  Nondescript Severity:  Mild Onset quality:  Gradual Duration:  3 weeks Timing:  Intermittent Progression:  Waxing and waning Chronicity:  New Context: upper respiratory infection   Context: not sick contacts   Relieved by:  Nothing Worsened by:  Nothing Associated symptoms: sinus congestion   Associated symptoms: no chest pain, no chills, no diaphoresis, no ear fullness, no ear pain, no eye discharge, no fever, no headaches, no myalgias, no rash, no rhinorrhea, no shortness of breath, no sore throat and no wheezing   Risk factors comment:  Copd      Home Medications Prior to Admission medications   Medication Sig Start Date End Date Taking? Authorizing Provider  doxycycline (VIBRAMYCIN) 100 MG capsule Take 1 capsule (100 mg total) by mouth 2 (two) times daily. 04/30/22  Yes Donnella Morford, DO  predniSONE (DELTASONE) 20 MG tablet Take 1 tablet (20 mg total) by mouth daily for 5 days. 04/30/22 05/05/22 Yes Seif Teichert, DO  Ascorbic Acid (VITAMIN C PO) Take 1 tablet by mouth daily.    [provider]  Calcium Citrate (CALCITRATE PO) Take 1 tablet by mouth daily.    [provider]  Cholecalciferol (VITAMIN D PO) Take 1 tablet by mouth daily.    [provider]  cholestyramine light (PREVALITE) 4 GM/DOSE powder Take 4 g by mouth 2 (two) times daily with a meal. 09/11/17   [provider]  Cyanocobalamin (VITAMIN B 12 PO) Take 1 tablet by mouth daily.    [provider]  MAGNESIUM PO Take 500 mg by mouth daily.    [provider]  Multiple Vitamins-Minerals (CENTRUM SILVER PO) Take 1 tablet by mouth daily.     [provider]  Probiotic Product (PROBIOTIC PO) Take 1 tablet by mouth daily.    [provider]      Allergies    Fluoxetine, Sulfamethoxazole, and Tramadol    Review of Systems   Review of Systems  Constitutional:  Negative for chills, diaphoresis and fever.  HENT:  Negative for ear pain, rhinorrhea and sore throat.   Eyes:  Negative for discharge.  Respiratory:  Positive for cough. Negative for shortness of breath and wheezing.   Cardiovascular:  Negative for chest pain.  Musculoskeletal:  Negative for myalgias.  Skin:  Negative for rash.  Neurological:  Negative for headaches.    Physical Exam Updated Vital Signs BP (!) 162/93 (BP Location: Right Arm)   Pulse 79   Temp 97.9 F (36.6 C)   Resp 16   Ht 5\' 1"  (1.549 m)   Wt 46.3 kg   SpO2 100%   BMI 19.27 kg/m  Physical Exam Vitals and nursing note reviewed.  Constitutional:      General: She is not in acute distress.    Appearance: She is well-developed. She is not ill-appearing.  HENT:     Head: Normocephalic and atraumatic.     Nose: Nose normal.     Mouth/Throat:     Mouth: Mucous membranes are moist.  Eyes:     Extraocular Movements: Extraocular movements intact.     Conjunctiva/sclera: Conjunctivae normal.     Pupils: Pupils are  equal, round, and reactive to light.  Cardiovascular:     Rate and Rhythm: Normal rate and regular rhythm.     Pulses: Normal pulses.     Heart sounds: Normal heart sounds. No murmur heard. Pulmonary:     Effort: Pulmonary effort is normal. No respiratory distress.     Breath sounds: Normal breath sounds.  Abdominal:     Palpations: Abdomen is soft.     Tenderness: There is no abdominal tenderness.  Musculoskeletal:        General: No swelling.     Cervical back: Normal range of motion and neck supple.  Skin:    General: Skin is warm and dry.     Capillary Refill: Capillary refill takes less than 2 seconds.  Neurological:     General: No focal deficit  present.     Mental Status: She is alert.  Psychiatric:        Mood and Affect: Mood normal.     ED Results / Procedures / Treatments   Labs (all labs ordered are listed, but only abnormal results are displayed) Labs Reviewed  SARS CORONAVIRUS 2 BY RT PCR    EKG None  Radiology DG Chest Portable 1 View  Result Date: 04/30/2022 CLINICAL DATA:  Productive cough for 3 weeks.  Emphysema. EXAM: PORTABLE CHEST 1 VIEW COMPARISON:  July 11, 2018 FINDINGS: Hyperinflation of the lungs remains. The heart, hila, and mediastinum are normal. No nodules or masses. No focal infiltrates. IMPRESSION: Hyperinflation of the lungs is consistent with history of emphysema. No acute abnormalities are identified to explain the patient's cough. Electronically Signed   By: Dorise Bullion III M.D.   On: 04/30/2022 08:04    Procedures Procedures    Medications Ordered in ED Medications - No data to display  ED Course/ Medical Decision Making/ A&P                           Medical Decision Making Amount and/or Complexity of Data Reviewed Radiology: ordered.  Risk Prescription drug management.   [name] is here with cough, history of COPD, high cholesterol.  No fever, no chills.  Normal vitals.  Well-appearing.  Just finished a course of Z-Pak.  Has been having a cough now productive for the last 3 weeks.  She is not a smoker.  She has no chest pain or shortness of breath.  Differential diagnosis is likely pneumonia versus bronchitis versus viral process.  We will get chest x-ray and COVID test.  He had a negative COVID test last week.  Chest x-ray per my review and interpretation shows no evidence of pneumonia or pneumothorax.  Changes consistent with emphysema.  My suspicion is that this is likely a bronchitis flare/COPD flare.  We will treat with doxycycline and prednisone.  Discharged in good condition.  Recommend follow-up with primary care doctor.  This chart was dictated using voice  recognition software.  Despite best efforts to proofread,  errors can occur which can change the documentation meaning.         Final Clinical Impression(s) / ED Diagnoses Final diagnoses:  Acute bronchitis, unspecified organism    Rx / DC Orders ED Discharge Orders          Ordered    doxycycline (VIBRAMYCIN) 100 MG capsule  2 times daily        04/30/22 0819    predniSONE (DELTASONE) 20 MG tablet  Daily  04/30/22 2595              Virgina Norfolk, DO 04/30/22 [id number]

## 2022-05-07 ENCOUNTER — Telehealth: Payer: Self-pay | Admitting: *Deleted

## 2022-05-07 NOTE — Telephone Encounter (Signed)
        Patient  visited [facility] ed on 04/30/2022  for bronchiolitis.   Telephone encounter attempt :  1st  A HIPAA compliant voice message was left requesting a return call.  Instructed patient to call back at [phone]. [place] [phone] [address] , [place] [address] Email : [email or web address]

## 2022-09-19 DIAGNOSIS — M7061 Trochanteric bursitis, right hip: Secondary | ICD-10-CM | POA: Diagnosis not present

## 2022-09-19 DIAGNOSIS — M25551 Pain in right hip: Secondary | ICD-10-CM | POA: Diagnosis not present

## 2022-12-05 DIAGNOSIS — R197 Diarrhea, unspecified: Secondary | ICD-10-CM | POA: Diagnosis not present

## 2023-02-04 DIAGNOSIS — J439 Emphysema, unspecified: Secondary | ICD-10-CM | POA: Diagnosis not present

## 2023-02-04 DIAGNOSIS — R7309 Other abnormal glucose: Secondary | ICD-10-CM | POA: Diagnosis not present

## 2023-02-04 DIAGNOSIS — M8580 Other specified disorders of bone density and structure, unspecified site: Secondary | ICD-10-CM | POA: Diagnosis not present

## 2023-02-04 DIAGNOSIS — E785 Hyperlipidemia, unspecified: Secondary | ICD-10-CM | POA: Diagnosis not present

## 2023-02-04 DIAGNOSIS — J449 Chronic obstructive pulmonary disease, unspecified: Secondary | ICD-10-CM | POA: Diagnosis not present

## 2023-02-04 DIAGNOSIS — Z0000 Encounter for general adult medical examination without abnormal findings: Secondary | ICD-10-CM | POA: Diagnosis not present

## 2023-02-04 DIAGNOSIS — E559 Vitamin D deficiency, unspecified: Secondary | ICD-10-CM | POA: Diagnosis not present

## 2023-02-04 DIAGNOSIS — K589 Irritable bowel syndrome without diarrhea: Secondary | ICD-10-CM | POA: Diagnosis not present

## 2023-02-06 DIAGNOSIS — Z1231 Encounter for screening mammogram for malignant neoplasm of breast: Secondary | ICD-10-CM | POA: Diagnosis not present

## 2023-02-11 DIAGNOSIS — H353130 Nonexudative age-related macular degeneration, bilateral, stage unspecified: Secondary | ICD-10-CM | POA: Diagnosis not present

## 2023-02-24 ENCOUNTER — Emergency Department (HOSPITAL_BASED_OUTPATIENT_CLINIC_OR_DEPARTMENT_OTHER): Payer: PPO

## 2023-02-24 ENCOUNTER — Encounter (HOSPITAL_BASED_OUTPATIENT_CLINIC_OR_DEPARTMENT_OTHER): Payer: Self-pay | Admitting: Emergency Medicine

## 2023-02-24 ENCOUNTER — Other Ambulatory Visit: Payer: Self-pay

## 2023-02-24 ENCOUNTER — Emergency Department (HOSPITAL_BASED_OUTPATIENT_CLINIC_OR_DEPARTMENT_OTHER)
Admission: EM | Admit: 2023-02-24 | Discharge: 2023-02-24 | Disposition: A | Discharge status: Home / Self Care | Payer: PPO | Attending: Emergency Medicine | Admitting: Emergency Medicine

## 2023-02-24 DIAGNOSIS — R4182 Altered mental status, unspecified: Secondary | ICD-10-CM | POA: Diagnosis not present

## 2023-02-24 DIAGNOSIS — R5383 Other fatigue: Secondary | ICD-10-CM | POA: Diagnosis present

## 2023-02-24 DIAGNOSIS — R413 Other amnesia: Secondary | ICD-10-CM | POA: Diagnosis not present

## 2023-02-24 DIAGNOSIS — U071 COVID-19: Secondary | ICD-10-CM | POA: Insufficient documentation

## 2023-02-24 DIAGNOSIS — I6782 Cerebral ischemia: Secondary | ICD-10-CM | POA: Diagnosis not present

## 2023-02-24 DIAGNOSIS — R410 Disorientation, unspecified: Secondary | ICD-10-CM | POA: Diagnosis not present

## 2023-02-24 LAB — URINALYSIS, ROUTINE W REFLEX MICROSCOPIC
Bilirubin Urine: NEGATIVE
Glucose, UA: NEGATIVE mg/dL
Hgb urine dipstick: NEGATIVE
Ketones, ur: NEGATIVE mg/dL
Leukocytes,Ua: NEGATIVE
Nitrite: NEGATIVE
Protein, ur: NEGATIVE mg/dL
Specific Gravity, Urine: 1.005 (ref 1.005–1.030)
pH: 6.5 (ref 5.0–8.0)

## 2023-02-24 LAB — DIFFERENTIAL
Abs Immature Granulocytes: 0.01 10*3/uL (ref 0.00–0.07)
Basophils Absolute: 0 10*3/uL (ref 0.0–0.1)
Basophils Relative: 0 %
Eosinophils Absolute: 0 10*3/uL (ref 0.0–0.5)
Eosinophils Relative: 0 %
Immature Granulocytes: 0 %
Lymphocytes Relative: 23 %
Lymphs Abs: 0.6 10*3/uL — ABNORMAL LOW (ref 0.7–4.0)
Monocytes Absolute: 0.3 10*3/uL (ref 0.1–1.0)
Monocytes Relative: 12 %
Neutro Abs: 1.6 10*3/uL — ABNORMAL LOW (ref 1.7–7.7)
Neutrophils Relative %: 65 %

## 2023-02-24 LAB — PROTIME-INR
INR: 0.9 (ref 0.8–1.2)
Prothrombin Time: 12.6 seconds (ref 11.4–15.2)

## 2023-02-24 LAB — COMPREHENSIVE METABOLIC PANEL
ALT: 14 U/L (ref 0–44)
AST: 24 U/L (ref 15–41)
Albumin: 4.4 g/dL (ref 3.5–5.0)
Alkaline Phosphatase: 54 U/L (ref 38–126)
Anion gap: 6 (ref 5–15)
BUN: 15 mg/dL (ref 8–23)
CO2: 30 mmol/L (ref 22–32)
Calcium: 9.1 mg/dL (ref 8.9–10.3)
Chloride: 96 mmol/L — ABNORMAL LOW (ref 98–111)
Creatinine, Ser: 0.64 mg/dL (ref 0.44–1.00)
GFR, Estimated: >60 mL/min (ref >60)
Glucose, Bld: 101 mg/dL — ABNORMAL HIGH (ref 70–99)
Potassium: 3.6 mmol/L (ref 3.5–5.1)
Sodium: 132 mmol/L — ABNORMAL LOW (ref 135–145)
Total Bilirubin: 0.4 mg/dL (ref 0.3–1.2)
Total Protein: 6.5 g/dL (ref 6.5–8.1)

## 2023-02-24 LAB — CBC
HCT: 39.9 % (ref 36.0–46.0)
Hemoglobin: 13.9 g/dL (ref 12.0–15.0)
MCH: 33.7 pg (ref 26.0–34.0)
MCHC: 34.8 g/dL (ref 30.0–36.0)
MCV: 96.8 fL (ref 80.0–100.0)
Platelets: 167 10*3/uL (ref 150–400)
RBC: 4.12 MIL/uL (ref 3.87–5.11)
RDW: 11.9 % (ref 11.5–15.5)
WBC: 2.5 10*3/uL — ABNORMAL LOW (ref 4.0–10.5)
nRBC: 0 % (ref 0.0–0.2)

## 2023-02-24 LAB — SARS CORONAVIRUS 2 BY RT PCR: SARS Coronavirus 2 by RT PCR: POSITIVE — AB

## 2023-02-24 LAB — LIPASE, BLOOD: Lipase: 40 U/L (ref 11–51)

## 2023-02-24 LAB — TROPONIN I (HIGH SENSITIVITY)
Troponin I (High Sensitivity): 6 ng/L (ref <18)
Troponin I (High Sensitivity): 5 ng/L (ref <18)

## 2023-02-24 LAB — ETHANOL: Alcohol, Ethyl (B): <10 mg/dL (ref <10)

## 2023-02-24 LAB — APTT: aPTT: 27 seconds (ref 24–36)

## 2023-02-24 MED ORDER — PAXLOVID (300/100) 20 X 150 MG & 10 X 100MG PO TBPK: 3.0000 | ORAL_TABLET | Freq: Two times a day (BID) | ORAL | 0 refills | Status: AC

## 2023-02-24 MED ORDER — SODIUM CHLORIDE 0.9% FLUSH: 3.0000 mL | Freq: Once | INTRAVENOUS | Status: DC

## 2023-02-24 NOTE — ED Provider Notes (Signed)
EMERGENCY DEPARTMENT AT [facility] Provider Note   CSN: [id number] Arrival date & time: 02/24/23  6578     History  Chief Complaint  Patient presents with   Altered Mental Status    [name] is a 82 y.o. female.  HPI At baseline.  Patient is very healthy and lives independently.  She is driving still.  She manages her own ADLs.  Patient reports that about a week ago she left her house in her car and got to the end of her road and did not know which way to go.  She found this very distressing and drove back home.  She reports since that time she is not felt like herself.  She reports that she has a significant decrease in energy generally and feels fatigued.  She reports she has lost her appetite.  She is making herself eat but does not have an appetite.  Denies abdominal pain nausea or vomiting.  She does have history of irritable bowel and takes cholestyramine.  Reports she takes some vitamin supplements but no other routine medications.  Patient reports she has gotten very worried that she is "missing something".  She reports she is repeatedly checking her pill minder and not sure of her self when it comes to having done small tasks.  Patient son is also noted a distinct change in short-term memory in terms of repeating herself within a few minutes.  Her son is at bedside he reports all of this is acute change since last week.    Home Medications Prior to Admission medications   Medication Sig Start Date End Date Taking? Authorizing Provider  nirmatrelvir & ritonavir (PAXLOVID, 300/100,) 20 x 150 MG & 10 x 100MG  TBPK Take 3 tablets by mouth 2 (two) times daily for 5 days. 02/24/23 03/01/23 Yes Arby Barrette, MD  Ascorbic Acid (VITAMIN C PO) Take 1 tablet by mouth daily.    [provider]  Calcium Citrate (CALCITRATE PO) Take 1 tablet by mouth daily.    [provider]  Cholecalciferol (VITAMIN D PO) Take 1 tablet by mouth daily.    [provider]  cholestyramine light (PREVALITE) 4 GM/DOSE powder Take 4 g by mouth 2 (two) times daily with a meal. 09/11/17   [provider]  Cyanocobalamin (VITAMIN B 12 PO) Take 1 tablet by mouth daily.    [provider]  doxycycline (VIBRAMYCIN) 100 MG capsule Take 1 capsule (100 mg total) by mouth 2 (two) times daily. 04/30/22   Curatolo, Adam, DO  MAGNESIUM PO Take 500 mg by mouth daily.    [provider]  Multiple Vitamins-Minerals (CENTRUM SILVER PO) Take 1 tablet by mouth daily.    [provider]  Probiotic Product (PROBIOTIC PO) Take 1 tablet by mouth daily.    [provider]      Allergies    Fluoxetine, Sulfamethoxazole, and Tramadol    Review of Systems   Review of Systems  Physical Exam Updated Vital Signs BP 126/61   Pulse 62   Temp 99 F (37.2 C) (Oral)   Resp (!) 21   Wt 43.1 kg   SpO2 94%   BMI 17.95 kg/m  Physical Exam Constitutional:      Comments: Well-nourished well-developed.  Excellent physical condition for age.  No acute distress.  Respirations nonlabored.  HENT:     Head: Normocephalic and atraumatic.     Nose: Nose normal.     Mouth/Throat:  Mouth: Mucous membranes are moist.     Pharynx: Oropharynx is clear.  Eyes:     Extraocular Movements: Extraocular movements intact.     Conjunctiva/sclera: Conjunctivae normal.     Pupils: Pupils are equal, round, and reactive to light.  Cardiovascular:     Rate and Rhythm: Normal rate and regular rhythm.  Pulmonary:     Effort: Pulmonary effort is normal.     Breath sounds: Normal breath sounds.  Abdominal:     General: There is no distension.     Palpations: Abdomen is soft.     Tenderness: There is no abdominal tenderness. There is no guarding.  Musculoskeletal:        General: No swelling or tenderness. Normal range of motion.     Cervical back: Neck supple.     Right lower leg: No edema.     Left lower leg: No edema.  Skin:    General: Skin  is warm and dry.  Neurological:     General: No focal deficit present.     Mental Status: She is oriented to person, place, and time.     Cranial Nerves: No cranial nerve deficit.     Sensory: No sensory deficit.     Motor: No weakness.     Coordination: Coordination normal.     Comments: No visual field cut to confrontational testing.  Normal finger-nose exam bilaterally.  No pronator drift.  Motor strength 5\5 upper and lower extremities.  Speech clear without expressive or receptive aphasia.     ED Results / Procedures / Treatments   Labs (all labs ordered are listed, but only abnormal results are displayed) Labs Reviewed  SARS CORONAVIRUS 2 BY RT PCR - Abnormal; Notable for the following components:      Result Value   SARS Coronavirus 2 by RT PCR POSITIVE (*)    All other components within normal limits  CBC - Abnormal; Notable for the following components:   WBC 2.5 (*)    All other components within normal limits  DIFFERENTIAL - Abnormal; Notable for the following components:   Neutro Abs 1.6 (*)    Lymphs Abs 0.6 (*)    All other components within normal limits  COMPREHENSIVE METABOLIC PANEL - Abnormal; Notable for the following components:   Sodium 132 (*)    Chloride 96 (*)    Glucose, Bld 101 (*)    All other components within normal limits  URINALYSIS, ROUTINE W REFLEX MICROSCOPIC - Abnormal; Notable for the following components:   Color, Urine COLORLESS (*)    All other components within normal limits  PROTIME-INR  APTT  ETHANOL  LIPASE, BLOOD  CBG MONITORING, ED  TROPONIN I (HIGH SENSITIVITY)  TROPONIN I (HIGH SENSITIVITY)    EKG EKG Interpretation Date/Time:  Sunday February 24 2023 08:42:41 EDT Ventricular Rate:  76 PR Interval:  128 QRS Duration:  80 QT Interval:  359 QTC Calculation: 404 R Axis:   86  Text Interpretation: Sinus rhythm Consider right atrial enlargement Borderline right axis deviation Borderline T abnormalities, anterior leads no sig  change from previous Confirmed by Arby Barrette [phone]) on 02/24/2023 12:19:58 PM  Radiology CT HEAD WO CONTRAST  Result Date: 02/24/2023 CLINICAL DATA:  82 year old female with new onset confusion and memory difficulty for 1 week. EXAM: CT HEAD WITHOUT CONTRAST TECHNIQUE: Contiguous axial images were obtained from the base of the skull through the vertex without intravenous contrast. RADIATION DOSE REDUCTION: This exam was performed according to the departmental  dose-optimization program which includes automated exposure control, adjustment of the mA and/or kV according to patient size and/or use of iterative reconstruction technique. COMPARISON:  None Available. FINDINGS: Brain: Mild scaphocephaly, normal variant. Cerebral volume is within normal limits for age. No midline shift, ventriculomegaly, mass effect, evidence of mass lesion, intracranial hemorrhage or evidence of cortically based acute infarction. Mild to moderate for age patchy bilateral white matter hypodensity, slightly greater in the left hemisphere. Otherwise maintained gray-white differentiation. No cortical encephalomalacia identified. Vascular: Calcified atherosclerosis at the skull base. No suspicious intracranial vascular hyperdensity. Skull: Right TMJ degeneration. No acute osseous abnormality identified. Sinuses/Orbits: Mild to moderate mostly ethmoid paranasal sinus mucosal thickening. Tympanic cavities and mastoids are clear. Other: No acute orbit or scalp soft tissue finding. IMPRESSION: 1. No acute intracranial abnormality. 2. Mild to moderate for age white matter changes, most commonly due to chronic small vessel disease. 3. Mild paranasal sinus inflammation. Electronically Signed   By: Odessa Fleming M.D.   On: 02/24/2023 09:40    Procedures Procedures    Medications Ordered in ED Medications  sodium chloride flush (NS) 0.9 % injection 3 mL (3 mLs Intravenous Not Given 02/24/23 1131)    ED Course/ Medical Decision Making/  A&P Clinical Course as of 02/24/23 1224  Sun Feb 24, 2023  1222 CT HEAD WO CONTRAST [MP]    Clinical Course User Index [MP] Arby Barrette, MD                             Medical Decision Making Amount and/or Complexity of Data Reviewed Labs: ordered. Radiology: ordered.  Risk Prescription drug management.   Patient had symptoms as outlined.  She has had some short-term memory loss and general fatigue.  Differential diagnosis includes CVA\dementia\acute infectious illness.  Patient is alert with normal vital signs.  No focal abnormality on neurologic exam.  Will proceed with CT head and metabolic workup.  Will proceed with infectious workup as well.  Routine chemistry is within normal limits except for mild hyponatremia sodium 132 normal GFR normal LFTs.  Urinalysis negative.  Count 2.5 normal H&H and normal platelets.  COVID test positive.  CT head interpreted by radiology no acute findings.  Age-related small white matter changes.  I have reassessed the patient and discussed all the findings with the patient and her son at bedside.  At this time, with COVID testing positive I suspect this is contributory to subtle mental status change and significant overall fatigue and lack of energy.  Discussed the possibility of unmasking of mild cognitive impairment in the setting of acute illness.  Description of symptoms is suggestive of mild cognitive impairment.  Also suggest possibility of small area stroke with finding on CT of age-related white matter changes and incremental nature of this in association with cognitive impairment.  Discussed risk-benefit of proceeding with an MRI at this time to definitively rule out stroke.  With shared decision making we decided to proceed with home care for COVID and close follow-up with PCP with careful return precautions for any acute changes or concerning developments.  The patient and her son voiced understanding.        Final Clinical  Impression(s) / ED Diagnoses Final diagnoses:  COVID  Altered mental status, unspecified altered mental status type    Rx / DC Orders ED Discharge Orders          Ordered    nirmatrelvir & ritonavir (PAXLOVID, 300/100,) 20 x  150 MG & 10 x 100MG  TBPK  2 times daily        02/24/23 1215              Arby Barrette, MD 02/24/23 1224

## 2023-02-24 NOTE — Discharge Instructions (Addendum)
1.  This time he had tested positive for COVID.  It is unclear exactly when you became positive.  I have sent a prescription for Paxlovid to the pharmacy.  This is an antiviral medication to try to help minimize symptoms of COVID and decrease length of illness.  It is usually recommended to start within 5 days or less of onset of illness.  This prescription may improve or shorten your symptoms but is not certain to do so. 2.  Try to stay well-hydrated and continue to eat a nutritious diet even if you are not hungry.  Get a lot of rest.  Prior to continue to move around and stretch your legs and take deep breaths.  If you have body aches or headache, take extra strength Tylenol. 3.  Return to the emergency department immediately if you are getting worsening symptoms.

## 2023-02-24 NOTE — ED Triage Notes (Signed)
Pt states about a week ago, she was driving in her neighborhood and couldn't remember how to get to her destination or how to get there. Pts son states since then she has been confused, she can't sequentially tell events and repeats herself often. Pts son state she seems to stare off into space as well. Pt reports she is still able to walk ,move all extr, no visual changes. Pt states her neck has been hurting and intermittent low grade headache to left temple.

## 2023-03-01 DIAGNOSIS — R5383 Other fatigue: Secondary | ICD-10-CM | POA: Diagnosis not present

## 2023-03-01 DIAGNOSIS — R531 Weakness: Secondary | ICD-10-CM | POA: Diagnosis not present

## 2023-03-01 DIAGNOSIS — F418 Other specified anxiety disorders: Secondary | ICD-10-CM | POA: Diagnosis not present

## 2023-03-18 ENCOUNTER — Encounter: Payer: Self-pay | Admitting: Physical Therapy

## 2023-03-18 ENCOUNTER — Other Ambulatory Visit: Payer: Self-pay

## 2023-03-18 ENCOUNTER — Ambulatory Visit: Payer: PPO | Attending: Family Medicine | Admitting: Physical Therapy

## 2023-03-18 DIAGNOSIS — R2681 Unsteadiness on feet: Secondary | ICD-10-CM | POA: Insufficient documentation

## 2023-03-18 DIAGNOSIS — M6281 Muscle weakness (generalized): Secondary | ICD-10-CM | POA: Insufficient documentation

## 2023-03-18 DIAGNOSIS — R2689 Other abnormalities of gait and mobility: Secondary | ICD-10-CM | POA: Diagnosis not present

## 2023-03-18 DIAGNOSIS — R29898 Other symptoms and signs involving the musculoskeletal system: Secondary | ICD-10-CM | POA: Diagnosis not present

## 2023-03-18 NOTE — Therapy (Signed)
OUTPATIENT PHYSICAL THERAPY NEURO EVALUATION   Patient Name: [name] MRN: [id number] DOB:August 01, 1940, 82 y.o., female Today's Date: 03/18/2023   PCP: Farris Has, MD REFERRING PROVIDER: Farris Has, MD   END OF SESSION:  PT End of Session - 03/18/23 1702     Visit Number 1    Number of Visits 12    Date for PT Re-Evaluation 04/26/23    Authorization Type HTA    Progress Note Due on Visit 10    PT Start Time 1408    PT Stop Time 1448    PT Time Calculation (min) 40 min    Equipment Utilized During Treatment --   Needs gait belt due to fear of falling   Activity Tolerance Patient tolerated treatment well    Behavior During Therapy [facility] for tasks assessed/performed             Past Medical History:  Diagnosis Date   ANEMIA 05/19/2009   ANXIETY DEPRESSION 05/19/2009   Arthritis    right hip    ARTHRITIS, HIP 05/19/2009   Chest pain 10/24/2010   COLITIS 05/22/2007   COPD (chronic obstructive pulmonary disease) (HCC)    Diarrhea 05/19/2009   Emphysema 11/01/2010   Femur fracture, right (HCC) 05/19/2012   H. pylori infection 11/15/2010   History of chicken pox    History of measles    HYPERLIPIDEMIA 03/11/2007   HYPERTHYROIDISM 03/11/2007   HYPOKALEMIA 05/19/2009   Irritable bowel syndrome 05/19/2009   Lymphadenopathy 11/01/2010   Palpitations 10/24/2010   Postherpetic neuralgia 11/23/2010   Preoperative clearance 08/16/2011   Shingles 11/15/2010   Urticaria 11/15/2010   UTI 03/11/2007   VITAMIN D DEFICIENCY 05/19/2009   WEIGHT LOSS 03/11/2007   Past Surgical History:  Procedure Laterality Date   ABDOMINAL HYSTERECTOMY     total   APPENDECTOMY     BREAST BIOPSY     OTHER SURGICAL HISTORY     bilateral breat biopsy wtih- fibrocystic breast disease    TOTAL HIP ARTHROPLASTY  10/01/2011   Procedure: TOTAL HIP ARTHROPLASTY;  Surgeon: Loanne Drilling, MD;  Location: [facility];  Service: Orthopedics;  Laterality: Right;   Patient Active Problem List   Diagnosis Date  Noted   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 pylori infection 11/15/2010   Lymphadenopathy 11/01/2010   Emphysema 11/01/2010   History of chicken pox    History of measles    Chest pain 10/24/2010   Palpitations 10/24/2010   Vitamin D deficiency 05/19/2009   HYPOKALEMIA 05/19/2009   ANEMIA 05/19/2009   ANXIETY DEPRESSION 05/19/2009   Irritable bowel syndrome 05/19/2009   ARTHRITIS, HIP 05/19/2009   Diarrhea 05/19/2009   COLITIS 05/22/2007   HYPERTHYROIDISM 03/11/2007   HYPERLIPIDEMIA 03/11/2007   WEIGHT LOSS 03/11/2007    ONSET DATE: 03/05/2023  REFERRING DIAG: R53.1 (ICD-10-CM) - Weakness   THERAPY DIAG:  Muscle weakness (generalized)  Other symptoms and signs involving the musculoskeletal system  Unsteadiness on feet  Other abnormalities of gait and mobility  Rationale for Evaluation and Treatment: Rehabilitation  SUBJECTIVE:  SUBJECTIVE STATEMENT: Have had some trouble with my hip over the past several years.  Feel very weak; scare of falling. Pt accompanied by: family [name]  PERTINENT HISTORY: anxiety, anxiety attacks, IBS, COVID, osteoporosis, COPD; right total hip replacement by Dr. Despina Hick in 2013 and subsequently had lateral right hip pain that was injected 3 separate times which gave temporary relief. She then went to [facility] and in 2014 underwent an arthroscopic trochanteric bursectomy, iliopsoas release,  anterior capsulectomy, anterior acetabular osteoplasty by Dr. Armen Pickup.   PAIN:  Are you having pain? Yes: NPRS scale: "like I know it's there"/10 Pain location: R hip and lateral thigh Pain description: like a knot Aggravating factors: when I go to stand up and walk Relieving factors: sitting  PRECAUTIONS: Fall  RED FLAGS: None   WEIGHT BEARING RESTRICTIONS: No  FALLS: Has patient fallen in last 6 months? No  LIVING ENVIRONMENT: Lives with: lives alone Lives in: House/apartment Stairs: No Has following equipment at home: Single point cane  PLOF: Independent and feels like pain is limiting mobility  PATIENT GOALS: To walk better and to not feel as if I will fall; better balance  OBJECTIVE:   DIAGNOSTIC FINDINGS: NA for this episode  COGNITION: Overall cognitive status: Within functional limits for tasks assessed   SENSATION: Light touch: WFL   POSTURE: weight shift left and Pt in very guarded posture, leaning to left and hiking shoulders due to pain  LOWER EXTREMITY ROM:     Active  Right Eval Left Eval  Hip flexion Limited with pain   Hip extension    Hip abduction    Hip adduction    Hip internal rotation    Hip external rotation    Knee flexion    Knee extension Limited due to pain   Ankle dorsiflexion    Ankle plantarflexion    Ankle inversion    Ankle eversion     (Blank rows = not tested)  LOWER EXTREMITY MMT:    MMT Right Eval Left Eval  Hip flexion 3- 4  Hip extension    Hip abduction 3+ pain 4  Hip adduction 3+ pain 4  Hip internal rotation    Hip external rotation    Knee flexion 3+ 4+  Knee extension 3 4+  Ankle dorsiflexion 4 4  Ankle plantarflexion    Ankle inversion    Ankle eversion    (Blank rows = not tested)   TRANSFERS: Assistive device utilized: None  Sit to stand: CGA Stand to sit: CGA   GAIT: Gait pattern:  used cane; instructed in use of cane with LUE, step through pattern, decreased step length- Right,  decreased step length- Left, knee flexed in stance- Right, and antalgic Distance walked: 43.59 sec over 20 ft Assistive device utilized: Single point cane Level of assistance: CGA Comments: Very guarded gait pattern  FUNCTIONAL TESTS:  5 times sit to stand: Not able to complete 30 seconds chair stand test:  30.97 (only able to perform 2 reps with definite BUE support) Gait velocity:  0.46 ft/sec with cane  M-CTSIB  Condition 1: Firm Surface, EO 30 Sec, Mild Sway  Condition 2: Firm Surface, EC 20.40 Sec, Severe Sway  Condition 3: Foam Surface, EO NT Sec,  NT  Sway  Condition 4: Foam Surface, EC NT Sec,  NT  Sway      TODAY'S TREATMENT:  DATE: 03/18/2023    PATIENT EDUCATION: Education details: Eval results, POC; fear of falling/hip pain contributing to cycle of decreased activity>leading to weakness and more fear of falling Person educated: Patient and Child(ren) Education method: Explanation Education comprehension: verbalized understanding  HOME EXERCISE PROGRAM: Use of ice post exercise or post eval today to R hip  GOALS: Goals reviewed with patient? Yes  SHORT TERM GOALS: Target date: 04/12/2023  Pt will be independent with HEP for improved pain, balance, strength, mobility. Baseline: Goal status: INITIAL  2.  Pt will perform at least 5 reps of sit<>Stand in 30 sec or less. Baseline: 2 reps in 30 sec Goal status: INITIAL  3.  Pt will improve gait velocity to at least 1 ft/sec for improved gait efficiency and safety. Baseline: 0.46 ft/sec Goal status: INITIAL  LONG TERM GOALS: Target date: 04/26/2023  Pt will be independent with HEP for improved pain, balance, strength, gait. Baseline:  Goal status: INITIAL  2.  Pt will improve 5x sit<>stand to less than or equal to 20 sec to demonstrate improved functional strength and transfer  efficiency. Baseline: 2 reps in 30 sec Goal status: INITIAL  3.  Pt will improve gait velocity to at least 1.8 ft/sec for improved gait efficiency and safety. Baseline:  Goal status: INITIAL  4.  Pt will report at least 50% reduction in R hip pain, to allow participation in ADLS and functional mobility. Baseline:  Goal status: INITIAL  5.  Pt will perform Condition 2-4 on MCTSIB for 30 seconds with moderate sway or better, for improved balance. Baseline:  Goal status: INITIAL    ASSESSMENT:  CLINICAL IMPRESSION: Patient is an 82 y.o. female who was seen today for physical therapy evaluation and treatment for weakness.   She and son report longstanding pain in R hip following R THR in 2013.  She also underwent subsequent procedure at [facility] (see above in pertinent history) and had injection for trochanteric bursitis in February 2024.  She has had COVID in the past month, and son/pt are concerned about weakness and potential for falls.  She presents today with increased pain to palpation to R greater trochanter and R IT band; she has pain response with very guarded posture before and during any movement, including active, resisted motions, decreased strength, decreased balance, decreased independence and safety with gait.  She is at fall risk per gait velocity and FTSTS scores.  She will benefit from skilled PT towards goals for improved functional mobility, independence, and decreased fall risk.  OBJECTIVE IMPAIRMENTS: Abnormal gait, decreased balance, decreased knowledge of use of DME, decreased mobility, difficulty walking, decreased ROM, decreased strength, impaired flexibility, postural dysfunction, and pain.   ACTIVITY LIMITATIONS: sitting, standing, transfers, and locomotion level  PARTICIPATION LIMITATIONS: meal prep, cleaning, laundry, driving, shopping, and community activity  PERSONAL FACTORS: 3+ comorbidities: see PMH above  are also affecting patient's functional outcome.    REHAB POTENTIAL: Good  CLINICAL DECISION MAKING: Evolving/moderate complexity  EVALUATION COMPLEXITY: Moderate  PLAN:  PT FREQUENCY: 2x/week  PT DURATION: 6 weeks including eval week  PLANNED INTERVENTIONS: Therapeutic exercises, Therapeutic activity, Neuromuscular re-education, Balance training, Gait training, Patient/Family education, Self Care, DME instructions, Aquatic Therapy, Dry Needling, Cryotherapy, Moist heat, Ionotophoresis 4mg /ml Dexamethasone, and Manual therapy  PLAN FOR NEXT SESSION: Initiate HEP for IT band stretch, hamstring stretch, R hip strength and ROM; gait training with cane; use of Nustep; potential for ionto for R hip (need to make sure to have correct MD order/signature)   Andrzej Scully  W., PT 03/18/2023, 5:04 PM  [facility] at [facility] [address] [place], [address] [place], [place] [address] Phone # [phone] Fax # [phone]

## 2023-03-21 ENCOUNTER — Ambulatory Visit: Payer: PPO

## 2023-03-21 DIAGNOSIS — M6281 Muscle weakness (generalized): Secondary | ICD-10-CM | POA: Diagnosis not present

## 2023-03-21 DIAGNOSIS — R2681 Unsteadiness on feet: Secondary | ICD-10-CM

## 2023-03-21 DIAGNOSIS — R2689 Other abnormalities of gait and mobility: Secondary | ICD-10-CM

## 2023-03-21 DIAGNOSIS — R29898 Other symptoms and signs involving the musculoskeletal system: Secondary | ICD-10-CM

## 2023-03-21 NOTE — Therapy (Signed)
OUTPATIENT PHYSICAL THERAPY NEURO TREATMENT   Patient Name: [name] MRN: [id number] DOB:Jan 11, 1941, 82 y.o., female Today's Date: 03/21/2023   PCP: Farris Has, MD REFERRING PROVIDER: Farris Has, MD   END OF SESSION:  PT End of Session - 03/21/23 0758     Visit Number 2    Number of Visits 12    Date for PT Re-Evaluation 04/26/23    Authorization Type HTA    Progress Note Due on Visit 10    PT Start Time 0800    PT Stop Time 0845    PT Time Calculation (min) 45 min    Equipment Utilized During Treatment --   Needs gait belt due to fear of falling   Activity Tolerance Patient tolerated treatment well    Behavior During Therapy [facility] for tasks assessed/performed             Past Medical History:  Diagnosis Date   ANEMIA 05/19/2009   ANXIETY DEPRESSION 05/19/2009   Arthritis    right hip    ARTHRITIS, HIP 05/19/2009   Chest pain 10/24/2010   COLITIS 05/22/2007   COPD (chronic obstructive pulmonary disease) (HCC)    Diarrhea 05/19/2009   Emphysema 11/01/2010   Femur fracture, right (HCC) 05/19/2012   H. pylori infection 11/15/2010   History of chicken pox    History of measles    HYPERLIPIDEMIA 03/11/2007   HYPERTHYROIDISM 03/11/2007   HYPOKALEMIA 05/19/2009   Irritable bowel syndrome 05/19/2009   Lymphadenopathy 11/01/2010   Palpitations 10/24/2010   Postherpetic neuralgia 11/23/2010   Preoperative clearance 08/16/2011   Shingles 11/15/2010   Urticaria 11/15/2010   UTI 03/11/2007   VITAMIN D DEFICIENCY 05/19/2009   WEIGHT LOSS 03/11/2007   Past Surgical History:  Procedure Laterality Date   ABDOMINAL HYSTERECTOMY     total   APPENDECTOMY     BREAST BIOPSY     OTHER SURGICAL HISTORY     bilateral breat biopsy wtih- fibrocystic breast disease    TOTAL HIP ARTHROPLASTY  10/01/2011   Procedure: TOTAL HIP ARTHROPLASTY;  Surgeon: Loanne Drilling, MD;  Location: [facility];  Service: Orthopedics;  Laterality: Right;   Patient Active Problem List   Diagnosis Date  Noted   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 pylori infection 11/15/2010   Lymphadenopathy 11/01/2010   Emphysema 11/01/2010   History of chicken pox    History of measles    Chest pain 10/24/2010   Palpitations 10/24/2010   Vitamin D deficiency 05/19/2009   HYPOKALEMIA 05/19/2009   ANEMIA 05/19/2009   ANXIETY DEPRESSION 05/19/2009   Irritable bowel syndrome 05/19/2009   ARTHRITIS, HIP 05/19/2009   Diarrhea 05/19/2009   COLITIS 05/22/2007   HYPERTHYROIDISM 03/11/2007   HYPERLIPIDEMIA 03/11/2007   WEIGHT LOSS 03/11/2007    ONSET DATE: 03/05/2023  REFERRING DIAG: R53.1 (ICD-10-CM) - Weakness   THERAPY DIAG:  Muscle weakness (generalized)  Other symptoms and signs involving the musculoskeletal system  Unsteadiness on feet  Other abnormalities of gait and mobility  Rationale for Evaluation and Treatment: Rehabilitation  SUBJECTIVE:  SUBJECTIVE STATEMENT: Hip is usually stiff in the AM but not as painful, around lunch time it's more painful and needs to rest for a while Pt accompanied by: family [name]  PERTINENT HISTORY: anxiety, anxiety attacks, IBS, COVID, osteoporosis, COPD; right total hip replacement by Dr. Despina Hick in 2013 and subsequently had lateral right hip pain that was injected 3 separate times which gave temporary relief. She then went to [facility] and in 2014 underwent an arthroscopic trochanteric bursectomy,  iliopsoas release, anterior capsulectomy, anterior acetabular osteoplasty by Dr. Armen Pickup.   PAIN:  Are you having pain? Yes: NPRS scale: "like I know it's there"/10 Pain location: R hip and lateral thigh Pain description: like a knot Aggravating factors: when I go to stand up and walk Relieving factors: sitting  PRECAUTIONS: Fall  RED FLAGS: None   WEIGHT BEARING RESTRICTIONS: No  FALLS: Has patient fallen in last 6 months? No  LIVING ENVIRONMENT: Lives with: lives alone Lives in: House/apartment Stairs: No Has following equipment at home: Single point cane  PLOF: Independent and feels like pain is limiting mobility  PATIENT GOALS: To walk better and to not feel as if I will fall; better balance  OBJECTIVE:   TODAY'S TREATMENT: 03/20/22 Activity Comments  Seated MHP to left hip  For pain and guarding  Seated ankle pumps 2x10   Quad sets 3x10 Heavy cues for quad engagement vs glute vs knee extension  Hooklying Hip adduction iso 3x10   Hooklying clamshell 3x10 Yellow loop (provided for HEP)  Knee extension stretch with heel prop x 1.5 min   Standing balance -feet together EO/EC x 30 sec (finger tip support for eyes closed) -feet together EO w head turns 3x    PATIENT EDUCATION: Education details: HEP initiation/instruction Person educated: Patient Education method: Explanation Education comprehension: verbalized understanding  HOME EXERCISE PROGRAM: Access Code: [email or web address] URL: [email or web address] Date: 03/21/2023 Prepared by: Shary Decamp  Exercises - Supine Quad Set  - 1 x daily - 7 x weekly - 3 sets - 10 reps - 2 sec hold - Supine Hip Adduction Isometric with Ball  - 1 x daily - 7 x weekly - 3 sets - 10 reps - 2 sec hold - Hooklying Clamshell with Resistance  - 1 x daily - 7 x weekly - 3 sets - 10 reps - 2 sec hold - Supine Knee Extension Mobilization with Weight  - 1 x daily - 7 x weekly - 1-2 minutes hold  DIAGNOSTIC FINDINGS: NA for this  episode  COGNITION: Overall cognitive status: Within functional limits for tasks assessed   SENSATION: Light touch: WFL   POSTURE: weight shift left and Pt in very guarded posture, leaning to left and hiking shoulders due to pain  LOWER EXTREMITY ROM:     Active  Right Eval Left Eval  Hip flexion Limited with pain   Hip extension    Hip abduction    Hip adduction    Hip internal rotation    Hip external rotation    Knee flexion    Knee extension Limited due to pain   Ankle dorsiflexion    Ankle plantarflexion    Ankle inversion    Ankle eversion     (Blank rows = not tested)  LOWER EXTREMITY MMT:    MMT Right Eval Left Eval  Hip flexion 3- 4  Hip extension    Hip abduction 3+ pain 4  Hip adduction 3+ pain 4  Hip internal rotation    Hip external rotation  Knee flexion 3+ 4+  Knee extension 3 4+  Ankle dorsiflexion 4 4  Ankle plantarflexion    Ankle inversion    Ankle eversion    (Blank rows = not tested)   TRANSFERS: Assistive device utilized: None  Sit to stand: CGA Stand to sit: CGA   GAIT: Gait pattern:  used cane; instructed in use of cane with LUE, step through pattern, decreased step length- Right, decreased step length- Left, knee flexed in stance- Right, and antalgic Distance walked: 43.59 sec over 20 ft Assistive device utilized: Single point cane Level of assistance: CGA Comments: Very guarded gait pattern  FUNCTIONAL TESTS:  5 times sit to stand: Not able to complete 30 seconds chair stand test:  30.97 (only able to perform 2 reps with definite BUE support) Gait velocity:  0.46 ft/sec with cane  M-CTSIB  Condition 1: Firm Surface, EO 30 Sec, Mild Sway  Condition 2: Firm Surface, EC 20.40 Sec, Severe Sway  Condition 3: Foam Surface, EO NT Sec,  NT  Sway  Condition 4: Foam Surface, EC NT Sec,  NT  Sway         GOALS: Goals reviewed with patient? Yes  SHORT TERM GOALS: Target date: 04/12/2023  Pt will be independent with HEP  for improved pain, balance, strength, mobility. Baseline: Goal status: INITIAL  2.  Pt will perform at least 5 reps of sit<>Stand in 30 sec or less. Baseline: 2 reps in 30 sec Goal status: INITIAL  3.  Pt will improve gait velocity to at least 1 ft/sec for improved gait efficiency and safety. Baseline: 0.46 ft/sec Goal status: INITIAL  LONG TERM GOALS: Target date: 04/26/2023  Pt will be independent with HEP for improved pain, balance, strength, gait. Baseline:  Goal status: INITIAL  2.  Pt will improve 5x sit<>stand to less than or equal to 20 sec to demonstrate improved functional strength and transfer efficiency. Baseline: 2 reps in 30 sec Goal status: INITIAL  3.  Pt will improve gait velocity to at least 1.8 ft/sec for improved gait efficiency and safety. Baseline:  Goal status: INITIAL  4.  Pt will report at least 50% reduction in R hip pain, to allow participation in ADLS and functional mobility. Baseline:  Goal status: INITIAL  5.  Pt will perform Condition 2-4 on MCTSIB for 30 seconds with moderate sway or better, for improved balance. Baseline:  Goal status: INITIAL    ASSESSMENT:  CLINICAL IMPRESSION: Reutrns to clinic without incident reported.  Initiated with MHP to lateral hip for pain/stiffness and performing ankle pumps. Positoined to hookyling with pt instructed in HEP activities for strength development of LE with emphasis on isometric movements for engagement requiring heavy cues for sequence/isolation with improved return demonstration by end of sets/reps.  Provided instructions for HEP and yellow t-loop for current and future program items.  Standing balance reveals excessive sway and fear of falling with eyes closed conditoins and increased sway with head movements.  Continued sessions indicated to progress mobility, balance, ROM and strength  OBJECTIVE IMPAIRMENTS: Abnormal gait, decreased balance, decreased knowledge of use of DME, decreased mobility,  difficulty walking, decreased ROM, decreased strength, impaired flexibility, postural dysfunction, and pain.   ACTIVITY LIMITATIONS: sitting, standing, transfers, and locomotion level  PARTICIPATION LIMITATIONS: meal prep, cleaning, laundry, driving, shopping, and community activity  PERSONAL FACTORS: 3+ comorbidities: see PMH above  are also affecting patient's functional outcome.   REHAB POTENTIAL: Good  CLINICAL DECISION MAKING: Evolving/moderate complexity  EVALUATION COMPLEXITY: Moderate  PLAN:  PT FREQUENCY: 2x/week  PT DURATION: 6 weeks including eval week  PLANNED INTERVENTIONS: Therapeutic exercises, Therapeutic activity, Neuromuscular re-education, Balance training, Gait training, Patient/Family education, Self Care, DME instructions, Aquatic Therapy, Dry Needling, Cryotherapy, Moist heat, Ionotophoresis 4mg /ml Dexamethasone, and Manual therapy  PLAN FOR NEXT SESSION: Initiate HEP for IT band stretch, hamstring stretch, R hip strength and ROM; gait training with cane; use of Nustep; potential for ionto for R hip (need to make sure to have correct MD order/signature). Corner balance activities for HEP   [employer], PT 03/21/2023, 7:59 AM  [facility] at [facility] [address] [place], [address] [place], [place] [address] Phone # [phone] Fax # [phone]

## 2023-03-25 ENCOUNTER — Ambulatory Visit: Payer: PPO

## 2023-03-25 DIAGNOSIS — M6281 Muscle weakness (generalized): Secondary | ICD-10-CM

## 2023-03-25 DIAGNOSIS — R29898 Other symptoms and signs involving the musculoskeletal system: Secondary | ICD-10-CM

## 2023-03-25 DIAGNOSIS — R2689 Other abnormalities of gait and mobility: Secondary | ICD-10-CM

## 2023-03-25 DIAGNOSIS — R2681 Unsteadiness on feet: Secondary | ICD-10-CM

## 2023-03-25 NOTE — Therapy (Signed)
OUTPATIENT PHYSICAL THERAPY NEURO TREATMENT   Patient Name: [name] MRN: [id number] DOB:30-Mar-1941, 82 y.o., female Today's Date: 03/25/2023   PCP: Farris Has, MD REFERRING PROVIDER: Farris Has, MD   END OF SESSION:  PT End of Session - 03/25/23 1449     Visit Number 3    Number of Visits 12    Date for PT Re-Evaluation 04/26/23    Authorization Type HTA    Progress Note Due on Visit 10    PT Start Time 1447    PT Stop Time 1530    PT Time Calculation (min) 43 min    Equipment Utilized During Treatment --   Needs gait belt due to fear of falling   Activity Tolerance Patient tolerated treatment well    Behavior During Therapy [facility] for tasks assessed/performed             Past Medical History:  Diagnosis Date   ANEMIA 05/19/2009   ANXIETY DEPRESSION 05/19/2009   Arthritis    right hip    ARTHRITIS, HIP 05/19/2009   Chest pain 10/24/2010   COLITIS 05/22/2007   COPD (chronic obstructive pulmonary disease) (HCC)    Diarrhea 05/19/2009   Emphysema 11/01/2010   Femur fracture, right (HCC) 05/19/2012   H. pylori infection 11/15/2010   History of chicken pox    History of measles    HYPERLIPIDEMIA 03/11/2007   HYPERTHYROIDISM 03/11/2007   HYPOKALEMIA 05/19/2009   Irritable bowel syndrome 05/19/2009   Lymphadenopathy 11/01/2010   Palpitations 10/24/2010   Postherpetic neuralgia 11/23/2010   Preoperative clearance 08/16/2011   Shingles 11/15/2010   Urticaria 11/15/2010   UTI 03/11/2007   VITAMIN D DEFICIENCY 05/19/2009   WEIGHT LOSS 03/11/2007   Past Surgical History:  Procedure Laterality Date   ABDOMINAL HYSTERECTOMY     total   APPENDECTOMY     BREAST BIOPSY     OTHER SURGICAL HISTORY     bilateral breat biopsy wtih- fibrocystic breast disease    TOTAL HIP ARTHROPLASTY  10/01/2011   Procedure: TOTAL HIP ARTHROPLASTY;  Surgeon: Loanne Drilling, MD;  Location: [facility];  Service: Orthopedics;  Laterality: Right;   Patient Active Problem List   Diagnosis Date  Noted   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 pylori infection 11/15/2010   Lymphadenopathy 11/01/2010   Emphysema 11/01/2010   History of chicken pox    History of measles    Chest pain 10/24/2010   Palpitations 10/24/2010   Vitamin D deficiency 05/19/2009   HYPOKALEMIA 05/19/2009   ANEMIA 05/19/2009   ANXIETY DEPRESSION 05/19/2009   Irritable bowel syndrome 05/19/2009   ARTHRITIS, HIP 05/19/2009   Diarrhea 05/19/2009   COLITIS 05/22/2007   HYPERTHYROIDISM 03/11/2007   HYPERLIPIDEMIA 03/11/2007   WEIGHT LOSS 03/11/2007    ONSET DATE: 03/05/2023  REFERRING DIAG: R53.1 (ICD-10-CM) - Weakness   THERAPY DIAG:  Muscle weakness (generalized)  Other symptoms and signs involving the musculoskeletal system  Unsteadiness on feet  Other abnormalities of gait and mobility  Rationale for Evaluation and Treatment: Rehabilitation  SUBJECTIVE:  SUBJECTIVE STATEMENT: "Been working at the exercises" Pt accompanied by: family [name]  PERTINENT HISTORY: anxiety, anxiety attacks, IBS, COVID, osteoporosis, COPD; right total hip replacement by Dr. Despina Hick in 2013 and subsequently had lateral right hip pain that was injected 3 separate times which gave temporary relief. She then went to [facility] and in 2014 underwent an arthroscopic trochanteric bursectomy, iliopsoas release, anterior capsulectomy, anterior acetabular osteoplasty by Dr.  Armen Pickup.   PAIN:  Are you having pain? Yes: NPRS scale: "like I know it's there"/10 Pain location: R hip and lateral thigh Pain description: like a knot Aggravating factors: when I go to stand up and walk Relieving factors: sitting  PRECAUTIONS: Fall  RED FLAGS: None   WEIGHT BEARING RESTRICTIONS: No  FALLS: Has patient fallen in last 6 months? No  LIVING ENVIRONMENT: Lives with: lives alone Lives in: House/apartment Stairs: No Has following equipment at home: Single point cane  PLOF: Independent and feels like pain is limiting mobility  PATIENT GOALS: To walk better and to not feel as if I will fall; better balance  OBJECTIVE:   TODAY'S TREATMENT: 03/25/23 Activity Comments  HEP review Use of red loop 3x5 reps for clamshell  Sidestepping x 2 min At counter  Retro-walking x 2 min Along counter  Static balance -standing on foam EO/EC 2x30 sec. Head turns 3x EO/EC -on ground: semi-tandem 3x15 sec   Gastroc stretch x 2 min On slantboard to reduce knee flexion guarding at knee        TODAY'S TREATMENT: 03/20/22 Activity Comments  Seated MHP to left hip  For pain and guarding  Seated ankle pumps 2x10   Quad sets 3x10 Heavy cues for quad engagement vs glute vs knee extension  Hooklying Hip adduction iso 3x10   Hooklying clamshell 3x10 Yellow loop (provided for HEP)  Knee extension stretch with heel prop x 1.5 min   Standing balance -feet together EO/EC x 30 sec (finger tip support for eyes closed) -feet together EO w head turns 3x    PATIENT EDUCATION: Education details: HEP initiation/instruction Person educated: Patient Education method: Explanation Education comprehension: verbalized understanding  HOME EXERCISE PROGRAM: Access Code: [email or web address] URL: [email or web address] Date: 03/21/2023 Prepared by: Shary Decamp  Exercises - Supine Quad Set  - 1 x daily - 7 x weekly - 3 sets - 10 reps - 2 sec hold - Supine Hip Adduction Isometric with Ball  - 1  x daily - 7 x weekly - 3 sets - 10 reps - 2 sec hold - Hooklying Clamshell with Resistance  - 1 x daily - 7 x weekly - 3 sets - 10 reps - 2 sec hold - Supine Knee Extension Mobilization with Weight  - 1 x daily - 7 x weekly - 1-2 minutes hold - Side Stepping with Counter Support  - 1 x daily - 7 x weekly - 1-3 sets - 2 min rounds hold - Backward Walking with Counter Support  - 1 x daily - 7 x weekly - 1-3 sets - 2 min rounds hold  DIAGNOSTIC FINDINGS: NA for this episode  COGNITION: Overall cognitive status: Within functional limits for tasks assessed   SENSATION: Light touch: WFL   POSTURE: weight shift left and Pt in very guarded posture, leaning to left and hiking shoulders due to pain  LOWER EXTREMITY ROM:     Active  Right Eval Left Eval  Hip flexion Limited with pain   Hip extension    Hip abduction    Hip adduction  Hip internal rotation    Hip external rotation    Knee flexion    Knee extension Limited due to pain   Ankle dorsiflexion    Ankle plantarflexion    Ankle inversion    Ankle eversion     (Blank rows = not tested)  LOWER EXTREMITY MMT:    MMT Right Eval Left Eval  Hip flexion 3- 4  Hip extension    Hip abduction 3+ pain 4  Hip adduction 3+ pain 4  Hip internal rotation    Hip external rotation    Knee flexion 3+ 4+  Knee extension 3 4+  Ankle dorsiflexion 4 4  Ankle plantarflexion    Ankle inversion    Ankle eversion    (Blank rows = not tested)   TRANSFERS: Assistive device utilized: None  Sit to stand: CGA Stand to sit: CGA   GAIT: Gait pattern:  used cane; instructed in use of cane with LUE, step through pattern, decreased step length- Right, decreased step length- Left, knee flexed in stance- Right, and antalgic Distance walked: 43.59 sec over 20 ft Assistive device utilized: Single point cane Level of assistance: CGA Comments: Very guarded gait pattern  FUNCTIONAL TESTS:  5 times sit to stand: Not able to complete 30  seconds chair stand test:  30.97 (only able to perform 2 reps with definite BUE support) Gait velocity:  0.46 ft/sec with cane  M-CTSIB  Condition 1: Firm Surface, EO 30 Sec, Mild Sway  Condition 2: Firm Surface, EC 20.40 Sec, Severe Sway  Condition 3: Foam Surface, EO NT Sec,  NT  Sway  Condition 4: Foam Surface, EC NT Sec,  NT  Sway         GOALS: Goals reviewed with patient? Yes  SHORT TERM GOALS: Target date: 04/12/2023  Pt will be independent with HEP for improved pain, balance, strength, mobility. Baseline: Goal status: IN PROGRESS  2.  Pt will perform at least 5 reps of sit<>Stand in 30 sec or less. Baseline: 2 reps in 30 sec Goal status: IN PROGRESS  3.  Pt will improve gait velocity to at least 1 ft/sec for improved gait efficiency and safety. Baseline: 0.46 ft/sec Goal status: IN PROGRESS  LONG TERM GOALS: Target date: 04/26/2023  Pt will be independent with HEP for improved pain, balance, strength, gait. Baseline:  Goal status: INITIAL  2.  Pt will improve 5x sit<>stand to less than or equal to 20 sec to demonstrate improved functional strength and transfer efficiency. Baseline: 2 reps in 30 sec Goal status: INITIAL  3.  Pt will improve gait velocity to at least 1.8 ft/sec for improved gait efficiency and safety. Baseline:  Goal status: INITIAL  4.  Pt will report at least 50% reduction in R hip pain, to allow participation in ADLS and functional mobility. Baseline:  Goal status: INITIAL  5.  Pt will perform Condition 2-4 on MCTSIB for 30 seconds with moderate sway or better, for improved balance. Baseline:  Goal status: INITIAL    ASSESSMENT:  CLINICAL IMPRESSION: Good HEP recall of initial activities.  Initiated more dyanmic/standing activities at counter for HEP additions to improve step length and loading response to RLE with heavy cues needed for achieving right hip abduction in sidestepping and hip extension with backwards walking.  Pt reports  increased anxiety and fear of falling with activities and guards by enacting short stride and increased double limb support.  Encouraged use of cane for improve stability and decreased loading to involved  RLE. Voices understanding. Continued sessions to progress POC details  OBJECTIVE IMPAIRMENTS: Abnormal gait, decreased balance, decreased knowledge of use of DME, decreased mobility, difficulty walking, decreased ROM, decreased strength, impaired flexibility, postural dysfunction, and pain.   ACTIVITY LIMITATIONS: sitting, standing, transfers, and locomotion level  PARTICIPATION LIMITATIONS: meal prep, cleaning, laundry, driving, shopping, and community activity  PERSONAL FACTORS: 3+ comorbidities: see PMH above  are also affecting patient's functional outcome.   REHAB POTENTIAL: Good  CLINICAL DECISION MAKING: Evolving/moderate complexity  EVALUATION COMPLEXITY: Moderate  PLAN:  PT FREQUENCY: 2x/week  PT DURATION: 6 weeks including eval week  PLANNED INTERVENTIONS: Therapeutic exercises, Therapeutic activity, Neuromuscular re-education, Balance training, Gait training, Patient/Family education, Self Care, DME instructions, Aquatic Therapy, Dry Needling, Cryotherapy, Moist heat, Ionotophoresis 4mg /ml Dexamethasone, and Manual therapy  PLAN FOR NEXT SESSION: progress sidestepping with loop around knees   Dion Body, PT 03/25/2023, 2:49 PM  [facility] at [facility] [address], [address] [place], [place] [address] Phone # [phone] Fax # [phone]

## 2023-03-26 NOTE — Therapy (Signed)
OUTPATIENT PHYSICAL THERAPY NEURO TREATMENT   Patient Name: [name] MRN: [id number] DOB:[birth year], 82 y.o., female Today's Date: 03/27/2023   PCP: Farris Has, MD REFERRING PROVIDER: Farris Has, MD   END OF SESSION:  PT End of Session - 03/27/23 1528     Visit Number 4    Number of Visits 12    Date for PT Re-Evaluation 04/26/23    Authorization Type HTA    Progress Note Due on Visit 10    PT Start Time 1446    PT Stop Time 1528    PT Time Calculation (min) 42 min    Equipment Utilized During Treatment Gait belt   Needs gait belt due to fear of falling   Activity Tolerance Patient tolerated treatment well    Behavior During Therapy [facility] for tasks assessed/performed              Past Medical History:  Diagnosis Date   ANEMIA 05/19/2009   ANXIETY DEPRESSION 05/19/2009   Arthritis    right hip    ARTHRITIS, HIP 05/19/2009   Chest pain 10/24/2010   COLITIS 05/22/2007   COPD (chronic obstructive pulmonary disease) (HCC)    Diarrhea 05/19/2009   Emphysema 11/01/2010   Femur fracture, right (HCC) 05/19/2012   H. pylori infection 11/15/2010   History of chicken pox    History of measles    HYPERLIPIDEMIA 03/11/2007   HYPERTHYROIDISM 03/11/2007   HYPOKALEMIA 05/19/2009   Irritable bowel syndrome 05/19/2009   Lymphadenopathy 11/01/2010   Palpitations 10/24/2010   Postherpetic neuralgia 11/23/2010   Preoperative clearance 08/16/2011   Shingles 11/15/2010   Urticaria 11/15/2010   UTI 03/11/2007   VITAMIN D DEFICIENCY 05/19/2009   WEIGHT LOSS 03/11/2007   Past Surgical History:  Procedure Laterality Date   ABDOMINAL HYSTERECTOMY     total   APPENDECTOMY     BREAST BIOPSY     OTHER SURGICAL HISTORY     bilateral breat biopsy wtih- fibrocystic breast disease    TOTAL HIP ARTHROPLASTY  10/01/2011   Procedure: TOTAL HIP ARTHROPLASTY;  Surgeon: Loanne Drilling, MD;  Location: [facility];  Service: Orthopedics;  Laterality: Right;   Patient Active Problem List    Diagnosis Date Noted   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 pylori infection 11/15/2010   Lymphadenopathy 11/01/2010   Emphysema 11/01/2010   History of chicken pox    History of measles    Chest pain 10/24/2010   Palpitations 10/24/2010   Vitamin D deficiency 05/19/2009   HYPOKALEMIA 05/19/2009   ANEMIA 05/19/2009   ANXIETY DEPRESSION 05/19/2009   Irritable bowel syndrome 05/19/2009   ARTHRITIS, HIP 05/19/2009   Diarrhea 05/19/2009   COLITIS 05/22/2007   HYPERTHYROIDISM 03/11/2007   HYPERLIPIDEMIA 03/11/2007   WEIGHT LOSS 03/11/2007    ONSET DATE: 03/05/2023  REFERRING DIAG: R53.1 (ICD-10-CM) - Weakness   THERAPY DIAG:  Muscle weakness (generalized)  Other symptoms and signs involving the musculoskeletal system  Unsteadiness on feet  Other abnormalities of gait and mobility  Rationale for Evaluation and Treatment: Rehabilitation  SUBJECTIVE:  SUBJECTIVE STATEMENT: Doing pretty good. Denies questions/concerns on HEP.   Pt accompanied by: family [name]  PERTINENT HISTORY: anxiety, anxiety attacks, IBS, COVID, osteoporosis, COPD; right total hip replacement by Dr. Despina Hick in 2013 and subsequently had lateral right hip pain that was injected 3 separate times which gave temporary relief. She then went to [facility] and in 2014 underwent an arthroscopic trochanteric bursectomy, iliopsoas release, anterior capsulectomy,  anterior acetabular osteoplasty by Dr. Armen Pickup.   PAIN:  Are you having pain? Yes: NPRS scale: "like I know it's there"/10 Pain location: R hip and lateral thigh Pain description: like a knot Aggravating factors: when I go to stand up and walk Relieving factors: sitting  PRECAUTIONS: Fall  RED FLAGS: None   WEIGHT BEARING RESTRICTIONS: No  FALLS: Has patient fallen in last 6 months? No  LIVING ENVIRONMENT: Lives with: lives alone Lives in: House/apartment Stairs: No Has following equipment at home: Single point cane  PLOF: Independent and feels like pain is limiting mobility  PATIENT GOALS: To walk better and to not feel as if I will fall; better balance  OBJECTIVE:     TODAY'S TREATMENT: 03/27/23 Activity Comments  Bridge 10x Limited amplitude   bridge ball 5x5" 2 sets    hooklying clam red TB 10x  c/o difficulty but no pain   Hooklying KTOS, fig 4 30" Tolerance; cueing for alignment and positioning   sidestepping with red TB above knees  Cues to maintain neutral hips, drop shoulders away from the ears ; small steps   Alt toe tap under sink  Weaning from 2 UE, to 1 UE, to no UE support with CGA; pt very hesitant   corner balance: standing EC 30" Romberg 30"  Good stability   Gait training with SPC  190ft Best stability in R hand; good sequencing but pt with short step length               HOME EXERCISE PROGRAM: Access Code: [email or web address] URL: [email or web address] Date: 03/27/2023 Prepared by: [facility] - Outpatient  Rehab - [facility]  Exercises - Supine Quad Set  - 1 x daily - 7 x weekly - 3 sets - 10 reps - 2 sec hold - Supine Hip Adduction Isometric with Ball  - 1 x daily - 7 x weekly - 3 sets - 10 reps - 2 sec hold - Hooklying Clamshell with Resistance  - 1 x daily - 7 x weekly - 3 sets - 10 reps - 2 sec hold - Supine Knee Extension Mobilization with Weight  - 1 x daily - 7 x weekly - 1-2 minutes hold - Side Stepping with Counter Support   - 1 x daily - 7 x weekly - 1-3 sets - 2 min rounds hold - Backward Walking with Counter Support  - 1 x daily - 7 x weekly - 1-3 sets - 2 min rounds hold - Standing Toe Taps  - 1 x daily - 5 x weekly - 2 sets - 10 reps   PATIENT EDUCATION: Education details: HEP update with edu for safety, encouraged use of SPC outside for max safety Person educated: Patient and Child(ren) Education method: Explanation, Demonstration, Tactile cues, Verbal cues, and Handouts Education comprehension: verbalized understanding and returned demonstration    Below measures were taken at time of initial evaluation unless otherwise specified:  DIAGNOSTIC FINDINGS: NA for this episode  COGNITION: Overall cognitive status: Within functional limits for tasks assessed   SENSATION: Light touch: WFL   POSTURE: weight shift  left and Pt in very guarded posture, leaning to left and hiking shoulders due to pain  LOWER EXTREMITY ROM:     Active  Right Eval Left Eval  Hip flexion Limited with pain   Hip extension    Hip abduction    Hip adduction    Hip internal rotation    Hip external rotation    Knee flexion    Knee extension Limited due to pain   Ankle dorsiflexion    Ankle plantarflexion    Ankle inversion    Ankle eversion     (Blank rows = not tested)  LOWER EXTREMITY MMT:    MMT Right Eval Left Eval  Hip flexion 3- 4  Hip extension    Hip abduction 3+ pain 4  Hip adduction 3+ pain 4  Hip internal rotation    Hip external rotation    Knee flexion 3+ 4+  Knee extension 3 4+  Ankle dorsiflexion 4 4  Ankle plantarflexion    Ankle inversion    Ankle eversion    (Blank rows = not tested)   TRANSFERS: Assistive device utilized: None  Sit to stand: CGA Stand to sit: CGA   GAIT: Gait pattern:  used cane; instructed in use of cane with LUE, step through pattern, decreased step length- Right, decreased step length- Left, knee flexed in stance- Right, and antalgic Distance walked:  43.59 sec over 20 ft Assistive device utilized: Single point cane Level of assistance: CGA Comments: Very guarded gait pattern  FUNCTIONAL TESTS:  5 times sit to stand: Not able to complete 30 seconds chair stand test:  30.97 (only able to perform 2 reps with definite BUE support) Gait velocity:  0.46 ft/sec with cane  M-CTSIB  Condition 1: Firm Surface, EO 30 Sec, Mild Sway  Condition 2: Firm Surface, EC 20.40 Sec, Severe Sway  Condition 3: Foam Surface, EO NT Sec,  NT  Sway  Condition 4: Foam Surface, EC NT Sec,  NT  Sway         GOALS: Goals reviewed with patient? Yes  SHORT TERM GOALS: Target date: 04/12/2023  Pt will be independent with HEP for improved pain, balance, strength, mobility. Baseline:Denies questions/concerns on HEP. 03/27/23 Goal status: MET  03/27/23  2.  Pt will perform at least 5 reps of sit<>Stand in 30 sec or less. Baseline: 2 reps in 30 sec Goal status: IN PROGRESS  3.  Pt will improve gait velocity to at least 1 ft/sec for improved gait efficiency and safety. Baseline: 0.46 ft/sec Goal status: IN PROGRESS  LONG TERM GOALS: Target date: 04/26/2023  Pt will be independent with HEP for improved pain, balance, strength, gait. Baseline:  Goal status: INITIAL  2.  Pt will improve 5x sit<>stand to less than or equal to 20 sec to demonstrate improved functional strength and transfer efficiency. Baseline: 2 reps in 30 sec Goal status: INITIAL  3.  Pt will improve gait velocity to at least 1.8 ft/sec for improved gait efficiency and safety. Baseline:  Goal status: INITIAL  4.  Pt will report at least 50% reduction in R hip pain, to allow participation in ADLS and functional mobility. Baseline:  Goal status: INITIAL  5.  Pt will perform Condition 2-4 on MCTSIB for 30 seconds with moderate sway or better, for improved balance. Baseline:  Goal status: INITIAL    ASSESSMENT:  CLINICAL IMPRESSION: Patient arrived to session without complaints.  Worked o progression of LE strengthening ther-ex. Patient with limited amplitude with bridges  and noted difficulty with hip abduction strengthening. Patient very guarded and fearful with standing tasks. Gait training with cane revealed short step length but good stability and sequencing, thus encouraged her to use this AD outside with max safety and balance confidence. Patient and son reported understanding. No complaints upon leaving.   OBJECTIVE IMPAIRMENTS: Abnormal gait, decreased balance, decreased knowledge of use of DME, decreased mobility, difficulty walking, decreased ROM, decreased strength, impaired flexibility, postural dysfunction, and pain.   ACTIVITY LIMITATIONS: sitting, standing, transfers, and locomotion level  PARTICIPATION LIMITATIONS: meal prep, cleaning, laundry, driving, shopping, and community activity  PERSONAL FACTORS: 3+ comorbidities: see PMH above  are also affecting patient's functional outcome.   REHAB POTENTIAL: Good  CLINICAL DECISION MAKING: Evolving/moderate complexity  EVALUATION COMPLEXITY: Moderate  PLAN:  PT FREQUENCY: 2x/week  PT DURATION: 6 weeks including eval week  PLANNED INTERVENTIONS: Therapeutic exercises, Therapeutic activity, Neuromuscular re-education, Balance training, Gait training, Patient/Family education, Self Care, DME instructions, Aquatic Therapy, Dry Needling, Cryotherapy, Moist heat, Ionotophoresis 4mg /ml Dexamethasone, and Manual therapy  PLAN FOR NEXT SESSION: progress sidestepping with loop around knees; try gait training with SPC outside     Anette Guarneri, PT, DPT 03/27/23 3:31 PM  [facility] [address] [place], [place] [address] Phone # [phone] Fax # [phone]

## 2023-03-27 ENCOUNTER — Ambulatory Visit: Payer: PPO | Admitting: Physical Therapy

## 2023-03-27 ENCOUNTER — Encounter: Payer: Self-pay | Admitting: Physical Therapy

## 2023-03-27 DIAGNOSIS — M6281 Muscle weakness (generalized): Secondary | ICD-10-CM | POA: Diagnosis not present

## 2023-03-27 DIAGNOSIS — R29898 Other symptoms and signs involving the musculoskeletal system: Secondary | ICD-10-CM

## 2023-03-27 DIAGNOSIS — R2681 Unsteadiness on feet: Secondary | ICD-10-CM

## 2023-03-27 DIAGNOSIS — R2689 Other abnormalities of gait and mobility: Secondary | ICD-10-CM

## 2023-04-02 ENCOUNTER — Ambulatory Visit: Payer: PPO | Admitting: Rehabilitative and Restorative Service Providers"

## 2023-04-02 DIAGNOSIS — F418 Other specified anxiety disorders: Secondary | ICD-10-CM | POA: Diagnosis not present

## 2023-04-04 ENCOUNTER — Ambulatory Visit: Payer: PPO | Attending: Family Medicine | Admitting: Rehabilitative and Restorative Service Providers"

## 2023-04-04 ENCOUNTER — Encounter: Payer: Self-pay | Admitting: Rehabilitative and Restorative Service Providers"

## 2023-04-04 DIAGNOSIS — R29898 Other symptoms and signs involving the musculoskeletal system: Secondary | ICD-10-CM | POA: Insufficient documentation

## 2023-04-04 DIAGNOSIS — R2689 Other abnormalities of gait and mobility: Secondary | ICD-10-CM | POA: Insufficient documentation

## 2023-04-04 DIAGNOSIS — R2681 Unsteadiness on feet: Secondary | ICD-10-CM | POA: Insufficient documentation

## 2023-04-04 DIAGNOSIS — M6281 Muscle weakness (generalized): Secondary | ICD-10-CM | POA: Diagnosis not present

## 2023-04-04 NOTE — Therapy (Signed)
OUTPATIENT PHYSICAL THERAPY NEURO TREATMENT   Patient Name: [name] MRN: [id number] DOB:04-22-1941, 82 y.o., female Today's Date: 04/04/2023  PCP: Wendy Has, MD REFERRING PROVIDER: Farris Has, MD   END OF SESSION:  PT End of Session - 04/04/23 1144     Visit Number 5    Number of Visits 12    Date for PT Re-Evaluation 04/26/23    Authorization Type HTA    Progress Note Due on Visit 10    PT Start Time 1145    PT Stop Time 1230    PT Time Calculation (min) 45 min    Equipment Utilized During Treatment Gait belt   Needs gait belt due to fear of falling   Activity Tolerance Patient tolerated treatment well    Behavior During Therapy [facility] for tasks assessed/performed            Past Medical History:  Diagnosis Date   ANEMIA 05/19/2009   ANXIETY DEPRESSION 05/19/2009   Arthritis    right hip    ARTHRITIS, HIP 05/19/2009   Chest pain 10/24/2010   COLITIS 05/22/2007   COPD (chronic obstructive pulmonary disease) (HCC)    Diarrhea 05/19/2009   Emphysema 11/01/2010   Femur fracture, right (HCC) 05/19/2012   H. pylori infection 11/15/2010   History of chicken pox    History of measles    HYPERLIPIDEMIA 03/11/2007   HYPERTHYROIDISM 03/11/2007   HYPOKALEMIA 05/19/2009   Irritable bowel syndrome 05/19/2009   Lymphadenopathy 11/01/2010   Palpitations 10/24/2010   Postherpetic neuralgia 11/23/2010   Preoperative clearance 08/16/2011   Shingles 11/15/2010   Urticaria 11/15/2010   UTI 03/11/2007   VITAMIN D DEFICIENCY 05/19/2009   WEIGHT LOSS 03/11/2007   Past Surgical History:  Procedure Laterality Date   ABDOMINAL HYSTERECTOMY     total   APPENDECTOMY     BREAST BIOPSY     OTHER SURGICAL HISTORY     bilateral breat biopsy wtih- fibrocystic breast disease    TOTAL HIP ARTHROPLASTY  10/01/2011   Procedure: TOTAL HIP ARTHROPLASTY;  Surgeon: Loanne Drilling, MD;  Location: [facility];  Service: Orthopedics;  Laterality: Right;   Patient Active Problem List   Diagnosis  Date Noted   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 pylori infection 11/15/2010   Lymphadenopathy 11/01/2010   Emphysema 11/01/2010   History of chicken pox    History of measles    Chest pain 10/24/2010   Palpitations 10/24/2010   Vitamin D deficiency 05/19/2009   HYPOKALEMIA 05/19/2009   ANEMIA 05/19/2009   ANXIETY DEPRESSION 05/19/2009   Irritable bowel syndrome 05/19/2009   ARTHRITIS, HIP 05/19/2009   Diarrhea 05/19/2009   COLITIS 05/22/2007   HYPERTHYROIDISM 03/11/2007   HYPERLIPIDEMIA 03/11/2007   WEIGHT LOSS 03/11/2007    ONSET DATE: 03/05/2023  REFERRING DIAG: R53.1 (ICD-10-CM) - Weakness  THERAPY DIAG:  Muscle weakness (generalized)  Other symptoms and signs involving the musculoskeletal system  Unsteadiness on feet  Other abnormalities of gait and mobility  Rationale for Evaluation and Treatment: Rehabilitation  SUBJECTIVE:  SUBJECTIVE STATEMENT: The patient is doing her exercises near the countertop. Her right hip is feeling better with exercise. "I think I'm getting stronger."  Pt accompanied by: family [name]  PERTINENT HISTORY: anxiety, anxiety attacks, IBS, COVID, osteoporosis, COPD; right total hip replacement by Dr. Despina Hick in 2013 and subsequently had lateral right hip pain that was injected 3 separate times which gave temporary relief. She then went to [facility] and in 2014 underwent an arthroscopic  trochanteric bursectomy, iliopsoas release, anterior capsulectomy, anterior acetabular osteoplasty by Dr. Armen Pickup.   PAIN:  Are you having pain? Yes: NPRS scale: "like I know it's there"/10 Pain location: R hip and lateral thigh Pain description: like a knot Aggravating factors: when I go to stand up and walk Relieving factors: sitting  PRECAUTIONS: Fall  WEIGHT BEARING RESTRICTIONS: No  FALLS: Hill patient fallen in last 6 months? No  LIVING ENVIRONMENT: Lives with: lives alone Lives in: House/apartment Stairs: No Hill following equipment at home: Single point cane  PLOF: Independent and feels like pain is limiting mobility  PATIENT GOALS: To walk better and to not feel as if I will fall; better balance  OBJECTIVE:   [facility] Adult PT Treatment:                                                DATE: 04/04/23 Therapeutic Exercise: Standing Hip abduction with red theraband at countertop x 10 reps Rand L Heel raises at countertop bilateral x 12 reps Heel raises at countertop unilateral x 6 reps R and L Sidestepping with red theraband distal thighs x 10 feet x 3 reps R and L-- added dec'ing UE support and ER Ues for postural upright Neuromuscular re-ed: Wall bumps x 8 reps with demo + verbal cues Rocker board working on anterior/posterior limits of stability and holding at middle for balance control Anterior limits of stability using a theraband in the parallel bars for tactile/visual cue for anterior sway Feet in stride working on rocking ant/posterior with reaching Gait: Gait with cues for longer stride length and arm swing indoors without a device x 60 feet x 2 reps Community ambulation with [facility] working on posture (looking up), sequencing of device, and longer stride length-- PT provided CGA to close supervision for safety Curb negotiation with close supervision with SPC-- no LOB  TODAY'S TREATMENT: 03/27/23 Activity Comments  Bridge 10x Limited amplitude   bridge ball 5x5" 2 sets     hooklying clam red TB 10x  c/o difficulty but no pain   Hooklying KTOS, fig 4 30" Tolerance; cueing for alignment and positioning   sidestepping with red TB above knees  Cues to maintain neutral hips, drop shoulders away from the ears ; small steps   Alt toe tap under sink  Weaning from 2 UE, to 1 UE, to no UE support with CGA; pt very hesitant   corner balance: standing EC 30" Romberg 30"  Good stability   Gait training with SPC  120ft Best stability in R hand; good sequencing but pt with short step length              HOME EXERCISE PROGRAM: Access Code: [email or web address] URL: [email or web address] Date: 03/27/2023 Prepared by: [facility] - Outpatient  Rehab - [facility]  Exercises - Supine Quad Set  - 1 x daily - 7 x weekly - 3 sets - 10  reps - 2 sec hold - Supine Hip Adduction Isometric with Ball  - 1 x daily - 7 x weekly - 3 sets - 10 reps - 2 sec hold - Hooklying Clamshell with Resistance  - 1 x daily - 7 x weekly - 3 sets - 10 reps - 2 sec hold - Supine Knee Extension Mobilization with Weight  - 1 x daily - 7 x weekly - 1-2 minutes hold - Side Stepping with Counter Support  - 1 x daily - 7 x weekly - 1-3 sets - 2 min rounds hold - Backward Walking with Counter Support  - 1 x daily - 7 x weekly - 1-3 sets - 2 min rounds hold - Standing Toe Taps  - 1 x daily - 5 x weekly - 2 sets - 10 reps   PATIENT EDUCATION: Education details: HEP update with edu for safety, encouraged use of SPC outside for max safety Person educated: Patient and Child(ren) Education method: Explanation, Demonstration, Tactile cues, Verbal cues, and Handouts Education comprehension: verbalized understanding and returned demonstration   ________________________________________________________________________________________________________________ Below measures were taken at time of initial evaluation unless otherwise specified:  DIAGNOSTIC FINDINGS: NA for this  episode  COGNITION: Overall cognitive status: Within functional limits for tasks assessed   SENSATION: Light touch: WFL  POSTURE: weight shift left and Pt in very guarded posture, leaning to left and hiking shoulders due to pain  LOWER EXTREMITY ROM:    Active  Right Eval Left Eval  Hip flexion Limited with pain   Hip extension    Hip abduction    Hip adduction    Hip internal rotation    Hip external rotation    Knee flexion    Knee extension Limited due to pain   Ankle dorsiflexion    Ankle plantarflexion    Ankle inversion    Ankle eversion     (Blank rows = not tested)  LOWER EXTREMITY MMT:   MMT Right Eval Left Eval  Hip flexion 3- 4  Hip extension    Hip abduction 3+ pain 4  Hip adduction 3+ pain 4  Hip internal rotation    Hip external rotation    Knee flexion 3+ 4+  Knee extension 3 4+  Ankle dorsiflexion 4 4  Ankle plantarflexion    Ankle inversion    Ankle eversion    (Blank rows = not tested)  TRANSFERS: Assistive device utilized: None  Sit to stand: CGA Stand to sit: CGA  GAIT: Gait pattern:  used cane; instructed in use of cane with LUE, step through pattern, decreased step length- Right, decreased step length- Left, knee flexed in stance- Right, and antalgic Distance walked: 43.59 sec over 20 ft Assistive device utilized: Single point cane Level of assistance: CGA Comments: Very guarded gait pattern  FUNCTIONAL TESTS:  5 times sit to stand: Not able to complete 30 seconds chair stand test:  30.97 (only able to perform 2 reps with definite BUE support) Gait velocity:  0.46 ft/sec with cane  M-CTSIB  Condition 1: Firm Surface, EO 30 Sec, Mild Sway  Condition 2: Firm Surface, EC 20.40 Sec, Severe Sway  Condition 3: Foam Surface, EO NT Sec,  NT  Sway  Condition 4: Foam Surface, EC NT Sec,  NT  Sway      GOALS: Goals reviewed with patient? Yes  SHORT TERM GOALS: Target date: 04/12/2023  Pt will be independent with HEP for improved  pain, balance, strength, mobility. Baseline:Denies questions/concerns on HEP. 03/27/23 Goal  status: MET  03/27/23  2.  Pt will perform at least 5 reps of sit<>Stand in 30 sec or less. Baseline: 2 reps in 30 sec Goal status: IN PROGRESS  3.  Pt will improve gait velocity to at least 1 ft/sec for improved gait efficiency and safety. Baseline: 0.46 ft/sec Goal status: IN PROGRESS  LONG TERM GOALS: Target date: 04/26/2023  Pt will be independent with HEP for improved pain, balance, strength, gait. Baseline:  Goal status: INITIAL  2.  Pt will improve 5x sit<>stand to less than or equal to 20 sec to demonstrate improved functional strength and transfer efficiency. Baseline: 2 reps in 30 sec Goal status: INITIAL  3.  Pt will improve gait velocity to at least 1.8 ft/sec for improved gait efficiency and safety. Baseline:  Goal status: INITIAL  4.  Pt will report at least 50% reduction in R hip pain, to allow participation in ADLS and functional mobility. Baseline:  Goal status: INITIAL  5.  Pt will perform Condition 2-4 on MCTSIB for 30 seconds with moderate sway or better, for improved balance. Baseline:  Goal status: INITIAL  ASSESSMENT:  CLINICAL IMPRESSION: The patient and PT continued to work on gait for longer stride and tactile cues for arm swing, sequencing with SPC on outdoor surfaces, and balance. Focused session today on patient recognizing her limits of stability and balance strategies to improve comfort. Her gait is guarded with short stride, dec'd arm swing, and slowed pace. Within session, patient demonstrates some carryover with longer stride length. PT to continue to progress towards STGs and LTGs.   OBJECTIVE IMPAIRMENTS: Abnormal gait, decreased balance, decreased knowledge of use of DME, decreased mobility, difficulty walking, decreased ROM, decreased strength, impaired flexibility, postural dysfunction, and pain.   PLAN:  PT FREQUENCY: 2x/week  PT DURATION: 6  weeks including eval week  PLANNED INTERVENTIONS: Therapeutic exercises, Therapeutic activity, Neuromuscular re-education, Balance training, Gait training, Patient/Family education, Self Care, DME instructions, Aquatic Therapy, Dry Needling, Cryotherapy, Moist heat, Ionotophoresis 4mg /ml Dexamethasone, and Manual therapy  PLAN FOR NEXT SESSION: progress sidestepping with loop around knees; try gait training with SPC outside, *check STGs next week   Mycheal Veldhuizen, PT 04/04/2023

## 2023-04-09 NOTE — Therapy (Signed)
OUTPATIENT PHYSICAL THERAPY NEURO TREATMENT   Patient Name: [name] MRN: [id number] DOB:March 01, 1941, 82 y.o., female Today's Date: 04/10/2023  PCP: Farris Has, MD REFERRING PROVIDER: Farris Has, MD   END OF SESSION:  PT End of Session - 04/10/23 1535     Visit Number 6    Number of Visits 12    Date for PT Re-Evaluation 04/26/23    Authorization Type HTA    Progress Note Due on Visit 10    PT Start Time 1449    PT Stop Time 1534    PT Time Calculation (min) 45 min    Equipment Utilized During Treatment Gait belt   Needs gait belt due to fear of falling   Activity Tolerance Patient tolerated treatment well    Behavior During Therapy [facility] for tasks assessed/performed             Past Medical History:  Diagnosis Date   ANEMIA 05/19/2009   ANXIETY DEPRESSION 05/19/2009   Arthritis    right hip    ARTHRITIS, HIP 05/19/2009   Chest pain 10/24/2010   COLITIS 05/22/2007   COPD (chronic obstructive pulmonary disease) (HCC)    Diarrhea 05/19/2009   Emphysema 11/01/2010   Femur fracture, right (HCC) 05/19/2012   H. pylori infection 11/15/2010   History of chicken pox    History of measles    HYPERLIPIDEMIA 03/11/2007   HYPERTHYROIDISM 03/11/2007   HYPOKALEMIA 05/19/2009   Irritable bowel syndrome 05/19/2009   Lymphadenopathy 11/01/2010   Palpitations 10/24/2010   Postherpetic neuralgia 11/23/2010   Preoperative clearance 08/16/2011   Shingles 11/15/2010   Urticaria 11/15/2010   UTI 03/11/2007   VITAMIN D DEFICIENCY 05/19/2009   WEIGHT LOSS 03/11/2007   Past Surgical History:  Procedure Laterality Date   ABDOMINAL HYSTERECTOMY     total   APPENDECTOMY     BREAST BIOPSY     OTHER SURGICAL HISTORY     bilateral breat biopsy wtih- fibrocystic breast disease    TOTAL HIP ARTHROPLASTY  10/01/2011   Procedure: TOTAL HIP ARTHROPLASTY;  Surgeon: Loanne Drilling, MD;  Location: [facility];  Service: Orthopedics;  Laterality: Right;   Patient Active Problem List   Diagnosis  Date Noted   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 pylori infection 11/15/2010   Lymphadenopathy 11/01/2010   Emphysema 11/01/2010   History of chicken pox    History of measles    Chest pain 10/24/2010   Palpitations 10/24/2010   Vitamin D deficiency 05/19/2009   HYPOKALEMIA 05/19/2009   ANEMIA 05/19/2009   ANXIETY DEPRESSION 05/19/2009   Irritable bowel syndrome 05/19/2009   ARTHRITIS, HIP 05/19/2009   Diarrhea 05/19/2009   COLITIS 05/22/2007   HYPERTHYROIDISM 03/11/2007   HYPERLIPIDEMIA 03/11/2007   WEIGHT LOSS 03/11/2007    ONSET DATE: 03/05/2023  REFERRING DIAG: R53.1 (ICD-10-CM) - Weakness  THERAPY DIAG:  Muscle weakness (generalized)  Other symptoms and signs involving the musculoskeletal system  Unsteadiness on feet  Other abnormalities of gait and mobility  Rationale for Evaluation and Treatment: Rehabilitation  SUBJECTIVE:  SUBJECTIVE STATEMENT: Feeling like the hip is getting a little stronger. Reports that she walks around the block 1-2x/day and is consistent with her HEP.   Pt accompanied by: family [name]  PERTINENT HISTORY: anxiety, anxiety attacks, IBS, COVID, osteoporosis, COPD; right total hip replacement by Dr. Despina Hick in 2013 and subsequently had lateral right hip pain that was injected 3 separate times which gave temporary relief. She then went to [facility] and in 2014 underwent an arthroscopic  trochanteric bursectomy, iliopsoas release, anterior capsulectomy, anterior acetabular osteoplasty by Dr. Armen Pickup.   PAIN:  Are you having pain? Yes: NPRS scale: 0/10 Pain location: R hip and lateral thigh Pain description: like a knot Aggravating factors: when I go to stand up and walk Relieving factors: sitting  PRECAUTIONS: Fall  WEIGHT BEARING RESTRICTIONS: No  FALLS: Has patient fallen in last 6 months? No  LIVING ENVIRONMENT: Lives with: lives alone Lives in: House/apartment Stairs: No Has following equipment at home: Single point cane  PLOF: Independent and feels like pain is limiting mobility  PATIENT GOALS: To walk better and to not feel as if I will fall; better balance  OBJECTIVE:     TODAY'S TREATMENT: 04/10/23 Activity Comments  5xSTS 15.35 sec without UEs   45M walk  15.93 sec; 2.06 ft/sec  fwd/back stepping 15x each side 1 UE support; cueing to increase backwards step  alt toe tap on cone Weaned to 1 fingertip support with good stability   Corner balance:  romberg EC 30" romberg EC + head turns 30" romberg EC + head nods 30" Mild sway  gait training with SPC outside on sidewalk, curbs, ramps  561ft Good sequencing with cane; appeared to walk slightly quicker with cane but was steady throughout            HOME EXERCISE PROGRAM: Access Code: [email or web address] URL: [email or web address] Date: 04/10/2023 Prepared by: [facility] - Outpatient  Rehab - [facility]  Exercises - Supine Quad Set  - 1 x daily - 7 x weekly - 3 sets - 10 reps - 2 sec hold - Supine Hip Adduction Isometric with Ball  - 1 x daily - 7 x weekly - 3 sets - 10 reps - 2 sec hold - Hooklying Clamshell with Resistance  - 1 x daily - 7 x weekly - 3 sets - 10 reps - 2 sec hold - Supine Knee Extension Mobilization with Weight  - 1 x daily - 7 x weekly - 1-2 minutes hold - Side Stepping with Counter Support  - 1 x daily - 7 x weekly - 1-3 sets - 2 min rounds hold - Backward Walking  with Counter Support  - 1 x daily - 7 x weekly - 1-3 sets - 2 min rounds hold - Standing Toe Taps  - 1 x daily - 5 x weekly - 2 sets - 10 reps - Alternating Step Forward/Backward with Support  - 1 x daily - 5 x weekly - 2 sets - 10 reps  PATIENT EDUCATION: Education details: HEP update Person educated: Patient Education method: Explanation, Demonstration, Tactile cues, Verbal cues, and Handouts Education comprehension: verbalized understanding and returned demonstration    ________________________________________________________________________________________________________________ Below measures were taken at time of initial evaluation unless otherwise specified:  DIAGNOSTIC FINDINGS: NA for this episode  COGNITION: Overall cognitive status: Within functional limits for tasks assessed   SENSATION: Light touch: WFL  POSTURE: weight shift left and Pt in very guarded posture, leaning to left and hiking shoulders due to pain  LOWER EXTREMITY ROM:    Active  Right Eval Left Eval  Hip flexion Limited with pain   Hip extension    Hip abduction    Hip adduction    Hip internal rotation    Hip external rotation    Knee flexion    Knee extension Limited due to pain   Ankle dorsiflexion    Ankle plantarflexion    Ankle inversion    Ankle eversion     (Blank rows = not tested)  LOWER EXTREMITY MMT:   MMT Right Eval Left Eval  Hip flexion 3- 4  Hip extension    Hip abduction 3+ pain 4  Hip adduction 3+ pain 4  Hip internal rotation    Hip external rotation    Knee flexion 3+ 4+  Knee extension 3 4+  Ankle dorsiflexion 4 4  Ankle plantarflexion    Ankle inversion    Ankle eversion    (Blank rows = not tested)  TRANSFERS: Assistive device utilized: None  Sit to stand: CGA Stand to sit: CGA  GAIT: Gait pattern:  used cane; instructed in use of cane with LUE, step through pattern, decreased step length- Right, decreased step length- Left, knee flexed in stance-  Right, and antalgic Distance walked: 43.59 sec over 20 ft Assistive device utilized: Single point cane Level of assistance: CGA Comments: Very guarded gait pattern  FUNCTIONAL TESTS:  5 times sit to stand: Not able to complete 30 seconds chair stand test:  30.97 (only able to perform 2 reps with definite BUE support) Gait velocity:  0.46 ft/sec with cane  M-CTSIB  Condition 1: Firm Surface, EO 30 Sec, Mild Sway  Condition 2: Firm Surface, EC 20.40 Sec, Severe Sway  Condition 3: Foam Surface, EO NT Sec,  NT  Sway  Condition 4: Foam Surface, EC NT Sec,  NT  Sway      GOALS: Goals reviewed with patient? Yes  SHORT TERM GOALS: Target date: 04/12/2023  Pt will be independent with HEP for improved pain, balance, strength, mobility. Baseline:Denies questions/concerns on HEP. 03/27/23 Goal status: MET  03/27/23  2.  Pt will perform at least 5 reps of sit<>Stand in 30 sec or less. Baseline: 2 reps in 30 sec; completed 5xSTS in 15.35 sec without UEs 04/10/23 Goal status: MET 04/10/23  3.  Pt will improve gait velocity to at least 1 ft/sec for improved gait efficiency and safety. Baseline: 0.46 ft/sec; 2.06 ft/sec 04/10/23 Goal status: MET 04/10/23  LONG TERM GOALS: Target date: 04/26/2023  Pt will be independent with HEP for improved pain, balance, strength, gait. Baseline:  Goal status: IN PROGRESS  2.  Pt will improve 5x sit<>stand to less than or equal to 20 sec to demonstrate improved functional strength and transfer efficiency. Baseline: 2 reps in 30 sec Goal status:  IN PROGRESS  3.  Pt will improve gait velocity to at least 1.8 ft/sec for improved gait efficiency and safety. Baseline:  Goal status:  IN PROGRESS  4.  Pt will report at least 50% reduction in R hip pain, to allow participation in ADLS and functional mobility. Baseline:  Goal status:  IN PROGRESS  5.  Pt will perform Condition 2-4 on MCTSIB for 30 seconds with moderate sway or better, for improved  balance. Baseline:  Goal status:  IN PROGRESS  ASSESSMENT:  CLINICAL IMPRESSION: Patient arrived to session with report of improving R hip strength. STGs revealed improved STS and gait speed, meeting all these goals. Balance training requir4ed  cues for longer step length, however SLS stability appears improved. Gait training revealed good cane sequencing and stability with and without cane. Patient is progressing well towards goals.   OBJECTIVE IMPAIRMENTS: Abnormal gait, decreased balance, decreased knowledge of use of DME, decreased mobility, difficulty walking, decreased ROM, decreased strength, impaired flexibility, postural dysfunction, and pain.   PLAN:  PT FREQUENCY: 2x/week  PT DURATION: 6 weeks including eval week  PLANNED INTERVENTIONS: Therapeutic exercises, Therapeutic activity, Neuromuscular re-education, Balance training, Gait training, Patient/Family education, Self Care, DME instructions, Aquatic Therapy, Dry Needling, Cryotherapy, Moist heat, Ionotophoresis 4mg /ml Dexamethasone, and Manual therapy  PLAN FOR NEXT SESSION: progress sidestepping with loop around knees; continue balance training weaning UE support  Anette Guarneri, PT, DPT 04/10/23 3:37 PM  [facility] at [facility] [address], [address] [place], [place] [address] Phone # [phone] Fax # [phone]

## 2023-04-10 ENCOUNTER — Encounter: Payer: Self-pay | Admitting: Physical Therapy

## 2023-04-10 ENCOUNTER — Ambulatory Visit: Payer: PPO | Admitting: Physical Therapy

## 2023-04-10 DIAGNOSIS — R2689 Other abnormalities of gait and mobility: Secondary | ICD-10-CM

## 2023-04-10 DIAGNOSIS — R29898 Other symptoms and signs involving the musculoskeletal system: Secondary | ICD-10-CM

## 2023-04-10 DIAGNOSIS — R2681 Unsteadiness on feet: Secondary | ICD-10-CM

## 2023-04-10 DIAGNOSIS — M6281 Muscle weakness (generalized): Secondary | ICD-10-CM | POA: Diagnosis not present

## 2023-04-16 ENCOUNTER — Ambulatory Visit: Payer: PPO

## 2023-04-18 ENCOUNTER — Ambulatory Visit: Payer: PPO

## 2023-04-18 DIAGNOSIS — M6281 Muscle weakness (generalized): Secondary | ICD-10-CM

## 2023-04-18 DIAGNOSIS — R2681 Unsteadiness on feet: Secondary | ICD-10-CM

## 2023-04-18 DIAGNOSIS — R2689 Other abnormalities of gait and mobility: Secondary | ICD-10-CM

## 2023-04-18 DIAGNOSIS — R29898 Other symptoms and signs involving the musculoskeletal system: Secondary | ICD-10-CM

## 2023-04-18 NOTE — Therapy (Signed)
OUTPATIENT PHYSICAL THERAPY NEURO TREATMENT   Patient Name: [name] MRN: [id number] DOB:[birth year], 82 y.o., female Today's Date: 04/18/2023  PCP: Farris Has, MD REFERRING PROVIDER: Farris Has, MD   END OF SESSION:  PT End of Session - 04/18/23 1448     Visit Number 7    Number of Visits 12    Date for PT Re-Evaluation 04/26/23    Authorization Type HTA    Progress Note Due on Visit 10    PT Start Time 1445    PT Stop Time 1530    PT Time Calculation (min) 45 min    Equipment Utilized During Treatment Gait belt   Needs gait belt due to fear of falling   Activity Tolerance Patient tolerated treatment well    Behavior During Therapy [facility] for tasks assessed/performed             Past Medical History:  Diagnosis Date   ANEMIA 05/19/2009   ANXIETY DEPRESSION 05/19/2009   Arthritis    right hip    ARTHRITIS, HIP 05/19/2009   Chest pain 10/24/2010   COLITIS 05/22/2007   COPD (chronic obstructive pulmonary disease) (HCC)    Diarrhea 05/19/2009   Emphysema 11/01/2010   Femur fracture, right (HCC) 05/19/2012   H. pylori infection 11/15/2010   History of chicken pox    History of measles    HYPERLIPIDEMIA 03/11/2007   HYPERTHYROIDISM 03/11/2007   HYPOKALEMIA 05/19/2009   Irritable bowel syndrome 05/19/2009   Lymphadenopathy 11/01/2010   Palpitations 10/24/2010   Postherpetic neuralgia 11/23/2010   Preoperative clearance 08/16/2011   Shingles 11/15/2010   Urticaria 11/15/2010   UTI 03/11/2007   VITAMIN D DEFICIENCY 05/19/2009   WEIGHT LOSS 03/11/2007   Past Surgical History:  Procedure Laterality Date   ABDOMINAL HYSTERECTOMY     total   APPENDECTOMY     BREAST BIOPSY     OTHER SURGICAL HISTORY     bilateral breat biopsy wtih- fibrocystic breast disease    TOTAL HIP ARTHROPLASTY  10/01/2011   Procedure: TOTAL HIP ARTHROPLASTY;  Surgeon: Loanne Drilling, MD;  Location: [facility];  Service: Orthopedics;  Laterality: Right;   Patient Active Problem List   Diagnosis  Date Noted   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 pylori infection 11/15/2010   Lymphadenopathy 11/01/2010   Emphysema 11/01/2010   History of chicken pox    History of measles    Chest pain 10/24/2010   Palpitations 10/24/2010   Vitamin D deficiency 05/19/2009   HYPOKALEMIA 05/19/2009   ANEMIA 05/19/2009   ANXIETY DEPRESSION 05/19/2009   Irritable bowel syndrome 05/19/2009   ARTHRITIS, HIP 05/19/2009   Diarrhea 05/19/2009   COLITIS 05/22/2007   HYPERTHYROIDISM 03/11/2007   HYPERLIPIDEMIA 03/11/2007   WEIGHT LOSS 03/11/2007    ONSET DATE: 03/05/2023  REFERRING DIAG: R53.1 (ICD-10-CM) - Weakness  THERAPY DIAG:  Muscle weakness (generalized)  Other symptoms and signs involving the musculoskeletal system  Unsteadiness on feet  Other abnormalities of gait and mobility  Rationale for Evaluation and Treatment: Rehabilitation  SUBJECTIVE:  SUBJECTIVE STATEMENT: Hip has been sore with the cloudy/rainy weather  Pt accompanied by: family [name]  PERTINENT HISTORY: anxiety, anxiety attacks, IBS, COVID, osteoporosis, COPD; right total hip replacement by Dr. Despina Hick in 2013 and subsequently had lateral right hip pain that was injected 3 separate times which gave temporary relief. She then went to [facility] and in 2014 underwent an arthroscopic trochanteric bursectomy, iliopsoas release, anterior capsulectomy, anterior acetabular  osteoplasty by Dr. Armen Pickup.   PAIN:  Are you having pain? Yes: NPRS scale: 5/10 Pain location: R hip and lateral thigh Pain description: like a knot Aggravating factors: when I go to stand up and walk Relieving factors: sitting  PRECAUTIONS: Fall  WEIGHT BEARING RESTRICTIONS: No  FALLS: Has patient fallen in last 6 months? No  LIVING ENVIRONMENT: Lives with: lives alone Lives in: House/apartment Stairs: No Has following equipment at home: Single point cane  PLOF: Independent and feels like pain is limiting mobility  PATIENT GOALS: To walk better and to not feel as if I will fall; better balance  OBJECTIVE:    TODAY'S TREATMENT: 04/18/23 Activity Comments  Sidestepping x 2 min Along counter with red loop around knees  Mini-squats 2x10 Band around knees at counter  Long arc quad 2x10 Red loop around ankles  Multisensory balance activities To facilitate righting reactions           TODAY'S TREATMENT: 04/10/23 Activity Comments  5xSTS 15.35 sec without UEs   9M walk  15.93 sec; 2.06 ft/sec  fwd/back stepping 15x each side 1 UE support; cueing to increase backwards step  alt toe tap on cone Weaned to 1 fingertip support with good stability   Corner balance:  romberg EC 30" romberg EC + head turns 30" romberg EC + head nods 30" Mild sway  gait training with SPC outside on sidewalk, curbs, ramps  519ft Good sequencing with cane; appeared to walk slightly quicker with cane but was steady throughout            HOME EXERCISE PROGRAM: Access Code: [email or web address] URL: [email or web address] Date: 04/10/2023 Prepared by: [facility] - Outpatient  Rehab - [facility]  Exercises - Supine Quad Set  - 1 x daily - 7 x weekly - 3 sets - 10 reps - 2 sec hold - Supine Hip Adduction Isometric with Ball  - 1 x daily - 7 x weekly - 3 sets - 10 reps - 2 sec hold - Hooklying Clamshell with Resistance  - 1 x daily - 7 x weekly - 3 sets - 10 reps - 2 sec hold - Supine Knee  Extension Mobilization with Weight  - 1 x daily - 7 x weekly - 1-2 minutes hold - Backward Walking with Counter Support  - 1 x daily - 7 x weekly - 1-3 sets - 2 min rounds hold - Standing Toe Taps  - 1 x daily - 5 x weekly - 2 sets - 10 reps - Alternating Step Forward/Backward with Support  - 1 x daily - 5 x weekly - 2 sets - 10 reps - Side Stepping with Resistance at Thighs and Counter Support  - 1 x daily - 7 x weekly - 3 sets - 10 reps - Mini Squat with Counter Support  - 1 x daily - 7 x weekly - 3 sets - 10 reps - Seated Knee Extension with Resistance  - 1 x daily - 7 x weekly - 3 sets - 10 reps  PATIENT EDUCATION: Education details: HEP updates (04/18/23) Person  educated: Patient Education method: Explanation, Demonstration, Tactile cues, Verbal cues, and Handouts Education comprehension: verbalized understanding and returned demonstration    ________________________________________________________________________________________________________________ Below measures were taken at time of initial evaluation unless otherwise specified:  DIAGNOSTIC FINDINGS: NA for this episode  COGNITION: Overall cognitive status: Within functional limits for tasks assessed   SENSATION: Light touch: WFL  POSTURE: weight shift left and Pt in very guarded posture, leaning to left and hiking shoulders due to pain  LOWER EXTREMITY ROM:    Active  Right Eval Left Eval  Hip flexion Limited with pain   Hip extension    Hip abduction    Hip adduction    Hip internal rotation    Hip external rotation    Knee flexion    Knee extension Limited due to pain   Ankle dorsiflexion    Ankle plantarflexion    Ankle inversion    Ankle eversion     (Blank rows = not tested)  LOWER EXTREMITY MMT:   MMT Right Eval Left Eval  Hip flexion 3- 4  Hip extension    Hip abduction 3+ pain 4  Hip adduction 3+ pain 4  Hip internal rotation    Hip external rotation    Knee flexion 3+ 4+  Knee extension 3  4+  Ankle dorsiflexion 4 4  Ankle plantarflexion    Ankle inversion    Ankle eversion    (Blank rows = not tested)  TRANSFERS: Assistive device utilized: None  Sit to stand: CGA Stand to sit: CGA  GAIT: Gait pattern:  used cane; instructed in use of cane with LUE, step through pattern, decreased step length- Right, decreased step length- Left, knee flexed in stance- Right, and antalgic Distance walked: 43.59 sec over 20 ft Assistive device utilized: Single point cane Level of assistance: CGA Comments: Very guarded gait pattern  FUNCTIONAL TESTS:  5 times sit to stand: Not able to complete 30 seconds chair stand test:  30.97 (only able to perform 2 reps with definite BUE support) Gait velocity:  0.46 ft/sec with cane  M-CTSIB  Condition 1: Firm Surface, EO 30 Sec, Mild Sway  Condition 2: Firm Surface, EC 20.40 Sec, Severe Sway  Condition 3: Foam Surface, EO NT Sec,  NT  Sway  Condition 4: Foam Surface, EC NT Sec,  NT  Sway      GOALS: Goals reviewed with patient? Yes  SHORT TERM GOALS: Target date: 04/12/2023  Pt will be independent with HEP for improved pain, balance, strength, mobility. Baseline:Denies questions/concerns on HEP. 03/27/23 Goal status: MET  03/27/23  2.  Pt will perform at least 5 reps of sit<>Stand in 30 sec or less. Baseline: 2 reps in 30 sec; completed 5xSTS in 15.35 sec without UEs 04/10/23 Goal status: MET 04/10/23  3.  Pt will improve gait velocity to at least 1 ft/sec for improved gait efficiency and safety. Baseline: 0.46 ft/sec; 2.06 ft/sec 04/10/23 Goal status: MET 04/10/23  LONG TERM GOALS: Target date: 04/26/2023  Pt will be independent with HEP for improved pain, balance, strength, gait. Baseline:  Goal status: IN PROGRESS  2.  Pt will improve 5x sit<>stand to less than or equal to 20 sec to demonstrate improved functional strength and transfer efficiency. Baseline: 2 reps in 30 sec Goal status:  IN PROGRESS  3.  Pt will improve gait  velocity to at least 1.8 ft/sec for improved gait efficiency and safety. Baseline:  Goal status:  IN PROGRESS  4.  Pt will report at  least 50% reduction in R hip pain, to allow participation in ADLS and functional mobility. Baseline:  Goal status:  IN PROGRESS  5.  Pt will perform Condition 2-4 on MCTSIB for 30 seconds with moderate sway or better, for improved balance. Baseline:  Goal status:  IN PROGRESS  ASSESSMENT:  CLINICAL IMPRESSION: Pt reports overall improvement in strength and activity tolerance since outset of PT sessions.  Able to progress activities for strength using increased resistance for lateral stepping and initiation of mini-squats with loop resistance around knees to provide additional cue for hip abd engagement with good response requiring limited 1/4 range.  Difficulty with multi-sensory balance activities especially with hip strategy being elicited.  Continued sessions to progress POC details and progress to D/C.    OBJECTIVE IMPAIRMENTS: Abnormal gait, decreased balance, decreased knowledge of use of DME, decreased mobility, difficulty walking, decreased ROM, decreased strength, impaired flexibility, postural dysfunction, and pain.   PLAN:  PT FREQUENCY: 2x/week  PT DURATION: 6 weeks including eval week  PLANNED INTERVENTIONS: Therapeutic exercises, Therapeutic activity, Neuromuscular re-education, Balance training, Gait training, Patient/Family education, Self Care, DME instructions, Aquatic Therapy, Dry Needling, Cryotherapy, Moist heat, Ionotophoresis 4mg /ml Dexamethasone, and Manual therapy  PLAN FOR NEXT SESSION: progress for D/C-ready (04/25/23)  4:23 PM, 04/18/23 M. Shary Decamp, PT, DPT Physical Therapist- [place] Office Number: [phone]

## 2023-04-23 ENCOUNTER — Ambulatory Visit: Payer: PPO | Admitting: Physical Therapy

## 2023-04-25 ENCOUNTER — Ambulatory Visit: Payer: PPO

## 2023-04-25 DIAGNOSIS — R2689 Other abnormalities of gait and mobility: Secondary | ICD-10-CM

## 2023-04-25 DIAGNOSIS — M6281 Muscle weakness (generalized): Secondary | ICD-10-CM

## 2023-04-25 DIAGNOSIS — R29898 Other symptoms and signs involving the musculoskeletal system: Secondary | ICD-10-CM

## 2023-04-25 DIAGNOSIS — R2681 Unsteadiness on feet: Secondary | ICD-10-CM

## 2023-04-25 NOTE — Therapy (Signed)
OUTPATIENT PHYSICAL THERAPY NEURO TREATMENT and D/C summary   Patient Name: [name] MRN: [id number] DOB:1940/10/04, 82 y.o., female Today's Date: 04/25/2023  PCP: Farris Has, MD REFERRING PROVIDER: Farris Has, MD  PHYSICAL THERAPY DISCHARGE SUMMARY  Visits from Start of Care: 8  Current functional level related to goals / functional outcomes: All STG/LTG met with low risk for falls per outcome measures   Remaining deficits: Right hip discomfort   Education / Equipment: HEP and SilverSneakers education   Patient agrees to discharge. Patient goals were met. Patient is being discharged due to meeting the stated rehab goals.  END OF SESSION:  PT End of Session - 04/25/23 1442     Visit Number 8    Number of Visits 12    Date for PT Re-Evaluation 04/26/23    Authorization Type HTA    Progress Note Due on Visit 10    PT Start Time 1445    PT Stop Time 1530    PT Time Calculation (min) 45 min    Equipment Utilized During Treatment Gait belt   Needs gait belt due to fear of falling   Activity Tolerance Patient tolerated treatment well    Behavior During Therapy WFL for tasks assessed/performed             Past Medical History:  Diagnosis Date   ANEMIA 05/19/2009   ANXIETY DEPRESSION 05/19/2009   Arthritis    right hip    ARTHRITIS, HIP 05/19/2009   Chest pain 10/24/2010   COLITIS 05/22/2007   COPD (chronic obstructive pulmonary disease) (HCC)    Diarrhea 05/19/2009   Emphysema 11/01/2010   Femur fracture, right (HCC) 05/19/2012   H. pylori infection 11/15/2010   History of chicken pox    History of measles    HYPERLIPIDEMIA 03/11/2007   HYPERTHYROIDISM 03/11/2007   HYPOKALEMIA 05/19/2009   Irritable bowel syndrome 05/19/2009   Lymphadenopathy 11/01/2010   Palpitations 10/24/2010   Postherpetic neuralgia 11/23/2010   Preoperative clearance 08/16/2011   Shingles 11/15/2010   Urticaria 11/15/2010   UTI 03/11/2007   VITAMIN D DEFICIENCY 05/19/2009   WEIGHT  LOSS 03/11/2007   Past Surgical History:  Procedure Laterality Date   ABDOMINAL HYSTERECTOMY     total   APPENDECTOMY     BREAST BIOPSY     OTHER SURGICAL HISTORY     bilateral breat biopsy wtih- fibrocystic breast disease    TOTAL HIP ARTHROPLASTY  10/01/2011   Procedure: TOTAL HIP ARTHROPLASTY;  Surgeon: Loanne Drilling, MD;  Location: [facility];  Service: Orthopedics;  Laterality: Right;   Patient Active Problem List   Diagnosis Date Noted   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 pylori infection 11/15/2010   Lymphadenopathy 11/01/2010   Emphysema 11/01/2010   History of chicken pox    History of measles    Chest pain 10/24/2010   Palpitations 10/24/2010   Vitamin D deficiency 05/19/2009   HYPOKALEMIA 05/19/2009   ANEMIA 05/19/2009   ANXIETY DEPRESSION 05/19/2009   Irritable bowel syndrome 05/19/2009   ARTHRITIS, HIP 05/19/2009   Diarrhea 05/19/2009   COLITIS 05/22/2007   HYPERTHYROIDISM 03/11/2007   HYPERLIPIDEMIA 03/11/2007  WEIGHT LOSS 03/11/2007    ONSET DATE: 03/05/2023  REFERRING DIAG: R53.1 (ICD-10-CM) - Weakness  THERAPY DIAG:  Muscle weakness (generalized)  Other symptoms and signs involving the musculoskeletal system  Unsteadiness on feet  Other abnormalities of gait and mobility  Rationale for Evaluation and Treatment: Rehabilitation  SUBJECTIVE:                                                                                                                                                                                             SUBJECTIVE STATEMENT: Hip has been sore with the  cloudy/rainy weather  Pt accompanied by: family [name]  PERTINENT HISTORY: anxiety, anxiety attacks, IBS, COVID, osteoporosis, COPD; right total hip replacement by Dr. Despina Hick in 2013 and subsequently had lateral right hip pain that was injected 3 separate times which gave temporary relief. She then went to [facility] and in 2014 underwent an arthroscopic trochanteric bursectomy, iliopsoas release, anterior capsulectomy, anterior acetabular osteoplasty by Dr. Armen Pickup.   PAIN:  Are you having pain? Yes: NPRS scale: 5/10 Pain location: R hip and lateral thigh Pain description: like a knot Aggravating factors: when I go to stand up and walk Relieving factors: sitting  PRECAUTIONS: Fall  WEIGHT BEARING RESTRICTIONS: No  FALLS: Has patient fallen in last 6 months? No  LIVING ENVIRONMENT: Lives with: lives alone Lives in: House/apartment Stairs: No Has following equipment at home: Single point cane  PLOF: Independent and feels like pain is limiting mobility  PATIENT GOALS: To walk better and to not feel as if I will fall; better balance  OBJECTIVE:   TODAY'S TREATMENT: 04/25/23 Activity Comments  LTG review   Pt education regarding HEP progression and SilverSneakers program                    HOME EXERCISE PROGRAM: Access Code: [email or web address] URL: [email or web address] Date: 04/10/2023 Prepared by: [facility] - Outpatient  Rehab - [facility]  Exercises - Supine Quad Set  - 1 x daily - 7 x weekly - 3 sets - 10 reps - 2 sec hold - Supine Hip Adduction Isometric with Ball  - 1 x daily - 7 x weekly - 3 sets - 10 reps - 2 sec hold - Hooklying Clamshell with Resistance  - 1 x daily - 7 x weekly - 3 sets - 10 reps - 2 sec hold - Supine Knee Extension Mobilization with Weight  - 1 x daily - 7 x weekly - 1-2 minutes hold - Backward Walking with Counter Support  - 1 x daily - 7 x weekly - 1-3 sets - 2 min rounds hold -  Standing Toe Taps  - 1 x daily - 5 x weekly  - 2 sets - 10 reps - Alternating Step Forward/Backward with Support  - 1 x daily - 5 x weekly - 2 sets - 10 reps - Side Stepping with Resistance at Thighs and Counter Support  - 1 x daily - 7 x weekly - 3 sets - 10 reps - Mini Squat with Counter Support  - 1 x daily - 7 x weekly - 3 sets - 10 reps - Seated Knee Extension with Resistance  - 1 x daily - 7 x weekly - 3 sets - 10 reps  PATIENT EDUCATION: Education details: HEP updates (04/18/23) Person educated: Patient Education method: Programmer, multimedia, Demonstration, Tactile cues, Verbal cues, and Handouts Education comprehension: verbalized understanding and returned demonstration    ________________________________________________________________________________________________________________ Below measures were taken at time of initial evaluation unless otherwise specified:  DIAGNOSTIC FINDINGS: NA for this episode  COGNITION: Overall cognitive status: Within functional limits for tasks assessed   SENSATION: Light touch: WFL  POSTURE: weight shift left and Pt in very guarded posture, leaning to left and hiking shoulders due to pain  LOWER EXTREMITY ROM:    Active  Right Eval Left Eval  Hip flexion Limited with pain   Hip extension    Hip abduction    Hip adduction    Hip internal rotation    Hip external rotation    Knee flexion    Knee extension Limited due to pain   Ankle dorsiflexion    Ankle plantarflexion    Ankle inversion    Ankle eversion     (Blank rows = not tested)  LOWER EXTREMITY MMT:   MMT Right Eval Left Eval  Hip flexion 3- 4  Hip extension    Hip abduction 3+ pain 4  Hip adduction 3+ pain 4  Hip internal rotation    Hip external rotation    Knee flexion 3+ 4+  Knee extension 3 4+  Ankle dorsiflexion 4 4  Ankle plantarflexion    Ankle inversion    Ankle eversion    (Blank rows = not tested)  TRANSFERS: Assistive device utilized: None  Sit to stand: CGA Stand to sit: CGA  GAIT: Gait  pattern:  used cane; instructed in use of cane with LUE, step through pattern, decreased step length- Right, decreased step length- Left, knee flexed in stance- Right, and antalgic Distance walked: 43.59 sec over 20 ft Assistive device utilized: Single point cane Level of assistance: CGA Comments: Very guarded gait pattern  FUNCTIONAL TESTS:  5 times sit to stand: Not able to complete 30 seconds chair stand test:  30.97 (only able to perform 2 reps with definite BUE support) Gait velocity:  0.46 ft/sec with cane  M-CTSIB  Condition 1: Firm Surface, EO 30 Sec, Mild Sway  Condition 2: Firm Surface, EC 20.40 Sec, Severe Sway  Condition 3: Foam Surface, EO NT Sec,  NT  Sway  Condition 4: Foam Surface, EC NT Sec,  NT  Sway      GOALS: Goals reviewed with patient? Yes  SHORT TERM GOALS: Target date: 04/12/2023  Pt will be independent with HEP for improved pain, balance, strength, mobility. Baseline:Denies questions/concerns on HEP. 03/27/23 Goal status: MET  03/27/23  2.  Pt will perform at least 5 reps of sit<>Stand in 30 sec or less. Baseline: 2 reps in 30 sec; completed 5xSTS in 15.35 sec without UEs 04/10/23 Goal status: MET 04/10/23  3.  Pt will improve gait velocity  to at least 1 ft/sec for improved gait efficiency and safety. Baseline: 0.46 ft/sec; 2.06 ft/sec 04/10/23 Goal status: MET 04/10/23  LONG TERM GOALS: Target date: 04/26/2023  Pt will be independent with HEP for improved pain, balance, strength, gait. Baseline:  Goal status: MET  2.  Pt will improve 5x sit<>stand to less than or equal to 20 sec to demonstrate improved functional strength and transfer efficiency. Baseline: 2 reps in 30 sec; (04/25/23) 13 sec Goal status:  MET  3.  Pt will improve gait velocity to at least 1.8 ft/sec for improved gait efficiency and safety. Baseline: 2.3 ft/sec Goal status:  MET  4.  Pt will report at least 50% reduction in R hip pain, to allow participation in ADLS and functional  mobility. Baseline: "like I know it's there" Goal status:  MET  5.  Pt will perform Condition 2-4 on MCTSIB for 30 seconds with moderate sway or better, for improved balance. Baseline: moderate x 30 sec Goal status:  MET  ASSESSMENT:  CLINICAL IMPRESSION: Pt able to meet all STG/LTG and demonstrates global improvement in functional mobility ambulating with considerable increase to velocity and measuring low risk for falls per outcome measures.  Pt instructed in relevant HEP progressions for self-performance with good return demonstration.  Discussed importance of consistency with physical exercise with bias to strength training for max benefit. Verbalizes understanding and will D/C to HEP and hopefully community-class fitness for further progression/activity levels.     OBJECTIVE IMPAIRMENTS: Abnormal gait, decreased balance, decreased knowledge of use of DME, decreased mobility, difficulty walking, decreased ROM, decreased strength, impaired flexibility, postural dysfunction, and pain.   PLAN:  PT FREQUENCY: 2x/week  PT DURATION: 6 weeks including eval week  PLANNED INTERVENTIONS: Therapeutic exercises, Therapeutic activity, Neuromuscular re-education, Balance training, Gait training, Patient/Family education, Self Care, DME instructions, Aquatic Therapy, Dry Needling, Cryotherapy, Moist heat, Ionotophoresis 4mg /ml Dexamethasone, and Manual therapy  PLAN FOR NEXT SESSION: progress for D/C-ready (04/25/23)  2:42 PM, 04/25/23 M. Shary Decamp, PT, DPT Physical Therapist- [place] Office Number: [phone]

## 2023-04-29 DIAGNOSIS — R634 Abnormal weight loss: Secondary | ICD-10-CM | POA: Diagnosis not present

## 2023-04-29 DIAGNOSIS — R194 Change in bowel habit: Secondary | ICD-10-CM | POA: Diagnosis not present

## 2023-04-29 DIAGNOSIS — R197 Diarrhea, unspecified: Secondary | ICD-10-CM | POA: Diagnosis not present

## 2023-05-01 DIAGNOSIS — R197 Diarrhea, unspecified: Secondary | ICD-10-CM | POA: Diagnosis not present

## 2023-09-25 DIAGNOSIS — R600 Localized edema: Secondary | ICD-10-CM | POA: Diagnosis not present

## 2023-09-26 ENCOUNTER — Ambulatory Visit (HOSPITAL_COMMUNITY)
Admission: RE | Admit: 2023-09-26 | Discharge: 2023-09-26 | Disposition: A | Discharge status: Home / Self Care | Payer: PPO | Source: Ambulatory Visit | Attending: Cardiology | Admitting: Cardiology

## 2023-09-26 ENCOUNTER — Other Ambulatory Visit (HOSPITAL_COMMUNITY): Payer: Self-pay | Admitting: Family Medicine

## 2023-09-26 DIAGNOSIS — R600 Localized edema: Secondary | ICD-10-CM

## 2023-10-01 DIAGNOSIS — M7989 Other specified soft tissue disorders: Secondary | ICD-10-CM | POA: Diagnosis not present

## 2023-10-09 ENCOUNTER — Ambulatory Visit: Admitting: Podiatry

## 2023-10-16 ENCOUNTER — Ambulatory Visit (INDEPENDENT_AMBULATORY_CARE_PROVIDER_SITE_OTHER)

## 2023-10-16 ENCOUNTER — Ambulatory Visit: Admitting: Podiatry

## 2023-10-16 DIAGNOSIS — M79672 Pain in left foot: Secondary | ICD-10-CM | POA: Diagnosis not present

## 2023-10-17 NOTE — Progress Notes (Signed)
 Subjective:   Patient ID: [name], female   DOB: 83 y.o.   MRN: [id number]   HPI Patient presents with son with severe pain in the left heel that has improved somewhat over the 3 to 4 weeks that is been present but still sore with swelling.  States that they thought she had cellulitis and she has been treated with antibiotics and that is not been helpful with a gradual reduction of pain but still very sore.  Patient does not smoke tries to be active   Review of Systems  All other systems reviewed and are negative.       Objective:  Physical Exam Vitals and nursing note reviewed.  Constitutional:      Appearance: She is well-developed.  Pulmonary:     Effort: Pulmonary effort is normal.  Musculoskeletal:        General: Normal range of motion.  Skin:    General: Skin is warm.  Neurological:     Mental Status: She is alert.    Neurovascular status intact muscle strength found to be adequate negative Denna Haggard' sign noted range of motion moderately reduced ankle left secondary to pain with edema that is more in the body of the calcaneus itself with no discomfort posterior plantar heel.  Good digital perfusion well-oriented x 3     Assessment:  Probability that this is a stress fracture over inflammatory tendinitis condition     Plan:  H&P x-ray reviewed discussed and at this point I reviewed stress fracture I do think healing is occurring but I think that patient will still need to wear rigid type shoe gear for the next probably 4 weeks continue to experience swelling and healing will probably take 6-8 more weeks.  I do not recommend immobilization currently due to relative frail condition of patient and the fact that symptoms are not as severe as they were previously.  Patient to be seen back if symptoms continue  X-rays indicate a significant sclerotic line in the body of the calcaneus left consistent with probable stress fracture calcaneus

## 2024-02-05 DIAGNOSIS — K589 Irritable bowel syndrome without diarrhea: Secondary | ICD-10-CM | POA: Diagnosis not present

## 2024-02-05 DIAGNOSIS — E559 Vitamin D deficiency, unspecified: Secondary | ICD-10-CM | POA: Diagnosis not present

## 2024-02-05 DIAGNOSIS — Z0000 Encounter for general adult medical examination without abnormal findings: Secondary | ICD-10-CM | POA: Diagnosis not present

## 2024-02-05 DIAGNOSIS — Z23 Encounter for immunization: Secondary | ICD-10-CM | POA: Diagnosis not present

## 2024-02-05 DIAGNOSIS — J439 Emphysema, unspecified: Secondary | ICD-10-CM | POA: Diagnosis not present

## 2024-02-05 DIAGNOSIS — E785 Hyperlipidemia, unspecified: Secondary | ICD-10-CM | POA: Diagnosis not present

## 2024-02-05 DIAGNOSIS — M8580 Other specified disorders of bone density and structure, unspecified site: Secondary | ICD-10-CM | POA: Diagnosis not present

## 2024-02-05 DIAGNOSIS — R7309 Other abnormal glucose: Secondary | ICD-10-CM | POA: Diagnosis not present

## 2024-02-12 DIAGNOSIS — K589 Irritable bowel syndrome without diarrhea: Secondary | ICD-10-CM | POA: Diagnosis not present

## 2024-02-12 DIAGNOSIS — E785 Hyperlipidemia, unspecified: Secondary | ICD-10-CM | POA: Diagnosis not present

## 2024-02-12 DIAGNOSIS — R7309 Other abnormal glucose: Secondary | ICD-10-CM | POA: Diagnosis not present

## 2024-02-12 DIAGNOSIS — M8588 Other specified disorders of bone density and structure, other site: Secondary | ICD-10-CM | POA: Diagnosis not present

## 2024-02-12 DIAGNOSIS — N958 Other specified menopausal and perimenopausal disorders: Secondary | ICD-10-CM | POA: Diagnosis not present

## 2024-02-12 DIAGNOSIS — Z1231 Encounter for screening mammogram for malignant neoplasm of breast: Secondary | ICD-10-CM | POA: Diagnosis not present

## 2024-02-12 DIAGNOSIS — E559 Vitamin D deficiency, unspecified: Secondary | ICD-10-CM | POA: Diagnosis not present

## 2024-02-12 DIAGNOSIS — Z0000 Encounter for general adult medical examination without abnormal findings: Secondary | ICD-10-CM | POA: Diagnosis not present

## 2024-04-22 DIAGNOSIS — R197 Diarrhea, unspecified: Secondary | ICD-10-CM | POA: Diagnosis not present
